# Patient Record
Sex: Male | Born: 1950
Health system: Southern US, Community
[De-identification: ages and names within clinical notes are randomized; demographics above are authoritative.]

## PROBLEM LIST (undated history)

## (undated) DIAGNOSIS — Z950 Presence of cardiac pacemaker: Secondary | ICD-10-CM

## (undated) DIAGNOSIS — I429 Cardiomyopathy, unspecified: Secondary | ICD-10-CM

## (undated) DIAGNOSIS — I442 Atrioventricular block, complete: Secondary | ICD-10-CM

## (undated) DIAGNOSIS — Z9889 Other specified postprocedural states: Secondary | ICD-10-CM

## (undated) DIAGNOSIS — R0600 Dyspnea, unspecified: Secondary | ICD-10-CM

## (undated) DIAGNOSIS — I509 Heart failure, unspecified: Secondary | ICD-10-CM

## (undated) DIAGNOSIS — I10 Essential (primary) hypertension: Secondary | ICD-10-CM

## (undated) DIAGNOSIS — I4510 Unspecified right bundle-branch block: Secondary | ICD-10-CM

## (undated) DIAGNOSIS — I340 Nonrheumatic mitral (valve) insufficiency: Secondary | ICD-10-CM

## (undated) DIAGNOSIS — E785 Hyperlipidemia, unspecified: Secondary | ICD-10-CM

## (undated) DIAGNOSIS — I447 Left bundle-branch block, unspecified: Secondary | ICD-10-CM

## (undated) HISTORY — DX: Unspecified right bundle-branch block: I45.10

## (undated) HISTORY — PX: PACEMAKER PLACEMENT: SHX43

## (undated) HISTORY — DX: Left bundle-branch block, unspecified: I44.7

## (undated) HISTORY — DX: Essential (primary) hypertension: I10

## (undated) HISTORY — DX: Hyperlipidemia, unspecified: E78.5

## (undated) HISTORY — PX: CARDIAC CATHETERIZATION: SHX172

## (undated) HISTORY — DX: Dyspnea, unspecified: R06.00

## (undated) HISTORY — DX: Cardiomyopathy, unspecified: I42.9

---

## 2011-04-20 ENCOUNTER — Ambulatory Visit (HOSPITAL_COMMUNITY): Payer: Federal, State, Local not specified - PPO

## 2011-04-20 ENCOUNTER — Ambulatory Visit (HOSPITAL_COMMUNITY)
Admission: RE | Admit: 2011-04-20 | Discharge: 2011-04-20 | Disposition: A | Discharge status: Home / Self Care | Payer: Federal, State, Local not specified - PPO | Source: Ambulatory Visit | Attending: Cardiovascular Disease | Admitting: Cardiovascular Disease

## 2011-04-20 DIAGNOSIS — Z45018 Encounter for adjustment and management of other part of cardiac pacemaker: Secondary | ICD-10-CM | POA: Insufficient documentation

## 2011-04-20 DIAGNOSIS — I442 Atrioventricular block, complete: Secondary | ICD-10-CM | POA: Insufficient documentation

## 2011-04-20 LAB — PROTIME-INR: INR: 1.02 (ref 0.00–1.49)

## 2011-04-20 LAB — APTT: aPTT: 32 seconds (ref 24–37)

## 2011-04-21 NOTE — Op Note (Signed)
NAME:  Russell Martin, Russell Martin NO.:  [id number]  MEDICAL RECORD NO.:  [id number]  LOCATION:  [facility]                         FACILITY:  [facility]  PHYSICIAN:  Thurmon Fair, MD     DATE OF BIRTH:  April 18, 1951  DATE OF PROCEDURE:  04/20/2011 DATE OF DISCHARGE:                              OPERATIVE REPORT   PROCEDURES PERFORMED: 1. Dual-chamber permanent pacemaker generator replacement. 2. Light sedation.  PREOPERATIVE DIAGNOSES: 1. Complete heart block. 2. Pacemaker generator at elective replacement interval.  POSTOPERATIVE DIAGNOSES: 1. Complete heart block. 2. Pacemaker generator at elective replacement interval. 3. Successful replacement of dual-chamber permanent pacemaker.  COMPLICATIONS:  None.  ESTIMATED BLOOD LOSS:  Less than 5 mL.  MEDICATIONS ADMINISTERED:  Ancef 1 gram intravenously, lidocaine 1% 35 mL locally, Versed 3 mg intravenously, fentanyl 75 mcg intravenously.  DEVICE DETAILS:  The explanted generator is a Manufacturing systems engineer, model number [id number], serial number [id number], implanted August 2001.  The chronic atrial lead is a St. Jude 1488 TC - 46 cm, serial number [id number], implanted April 16, 2000.  The chronic ventricular lead is a St. Jude Medical 1346 T/52 cm, serial number [id number], implanted April 16, 2000.  The new pacemaker generator is a Architectural technologist DR RF, model number [id number], serial number [id number].  Postoperative electronic parameters were essentially unchanged from preoperative findings and consisted of the following.  Atrial lead sensed P-wave 3.6 mV, threshold 0.9 volts at 0.4 milliseconds pulse width, impedance 466 ohms, current 1.9 mA.  Ventricular lead sensed R-waves 17.7 mV, threshold 1 volt at 0.4 milliseconds pulse width, impedance 375 ohms, current 2.5 mA.  After risks and benefits of the procedure were described, the patient provided informed consent and was brought to the Cardiac Cath Lab in a fasting state.  He  was prepped and draped in the usual sterile fashion. Local anesthesia with 1% lidocaine was administered to the area of the previous scar.  The scar was found to be extremely thick keloid and extended deep into the soft tissue.  Dissection down to the pocket was done slowly and carefully with blunt dissection and very limited thermal cautery.  The pocket was found to be extremely heavily calcified with calcium deposits thicker than a typical egg shell.  Slow careful dissection was performed with great care being taken to avoid injury to the chronic leads.  Also, attention was given to provide adequate hemostasis.  The pocket was revised to eliminate as much as possible of the chronic calcified tissue and also to enlarge it slightly to accommodate the larger radiofrequency device.  The chronic device was explanted and disconnected from the leads.  The leads were individually checked.  There was an underlying escape rhythm at about 29 beats per minute which allowed detail lead interrogation.  Subsequently, the chronic leads were attached to a new generator and appropriate ventricular pacing and subsequently atrial tracking and ventricular pacing were noted.  The pocket was reinspected for hemostasis after has been flushed with copious amounts of antibiotic solution.  The generator was then placed in the pocket with care being taken that the leads assume a comfortable position deep to the generator.  The pocket was closed with two layers of 2-0 Vicryl and cutaneous staples after which a sterile pressure dressing was applied.  No immediate complications occurred.     Thurmon Fair, MD     MC/MEDQ  D:  04/20/2011  T:  04/20/2011  Job:  [id number]  cc:   Gerlene Burdock A. Alanda Amass, M.D.  Electronically Signed by Thurmon Fair M.D. on 04/21/2011 11:27:54 AM

## 2012-05-07 ENCOUNTER — Ambulatory Visit
Admission: RE | Admit: 2012-05-07 | Discharge: 2012-05-07 | Disposition: A | Discharge status: Home / Self Care | Payer: Federal, State, Local not specified - PPO | Source: Ambulatory Visit | Attending: Family Medicine | Admitting: Family Medicine

## 2012-05-07 ENCOUNTER — Other Ambulatory Visit: Payer: Self-pay | Admitting: Family Medicine

## 2012-05-07 DIAGNOSIS — R059 Cough, unspecified: Secondary | ICD-10-CM

## 2012-05-07 DIAGNOSIS — R05 Cough: Secondary | ICD-10-CM

## 2012-05-07 DIAGNOSIS — R0602 Shortness of breath: Secondary | ICD-10-CM

## 2012-08-26 ENCOUNTER — Emergency Department (HOSPITAL_COMMUNITY): Payer: Federal, State, Local not specified - PPO

## 2012-08-26 ENCOUNTER — Encounter (HOSPITAL_COMMUNITY): Payer: Self-pay | Admitting: Emergency Medicine

## 2012-08-26 ENCOUNTER — Emergency Department (HOSPITAL_COMMUNITY)
Admission: EM | Admit: 2012-08-26 | Discharge: 2012-08-26 | Disposition: A | Discharge status: Home / Self Care | Payer: Federal, State, Local not specified - PPO | Attending: Emergency Medicine | Admitting: Emergency Medicine

## 2012-08-26 DIAGNOSIS — J40 Bronchitis, not specified as acute or chronic: Secondary | ICD-10-CM

## 2012-08-26 DIAGNOSIS — Z7982 Long term (current) use of aspirin: Secondary | ICD-10-CM | POA: Insufficient documentation

## 2012-08-26 DIAGNOSIS — Z950 Presence of cardiac pacemaker: Secondary | ICD-10-CM | POA: Insufficient documentation

## 2012-08-26 DIAGNOSIS — I509 Heart failure, unspecified: Secondary | ICD-10-CM | POA: Insufficient documentation

## 2012-08-26 DIAGNOSIS — R059 Cough, unspecified: Secondary | ICD-10-CM | POA: Insufficient documentation

## 2012-08-26 DIAGNOSIS — R05 Cough: Secondary | ICD-10-CM | POA: Insufficient documentation

## 2012-08-26 DIAGNOSIS — Z87891 Personal history of nicotine dependence: Secondary | ICD-10-CM | POA: Insufficient documentation

## 2012-08-26 DIAGNOSIS — Z79899 Other long term (current) drug therapy: Secondary | ICD-10-CM | POA: Insufficient documentation

## 2012-08-26 HISTORY — DX: Heart failure, unspecified: I50.9

## 2012-08-26 LAB — CBC WITH DIFFERENTIAL/PLATELET
Basophils Absolute: 0.1 10*3/uL (ref 0.0–0.1)
Eosinophils Absolute: 0.2 10*3/uL (ref 0.0–0.7)
Eosinophils Relative: 2 % (ref 0–5)
HCT: 45.3 % (ref 39.0–52.0)
Lymphocytes Relative: 31 % (ref 12–46)
Lymphs Abs: 3.3 10*3/uL (ref 0.7–4.0)
MCH: 27.9 pg (ref 26.0–34.0)
MCV: 82.5 fL (ref 78.0–100.0)
Monocytes Absolute: 0.6 10*3/uL (ref 0.1–1.0)
Platelets: 194 10*3/uL (ref 150–400)
RDW: 15.2 % (ref 11.5–15.5)
WBC: 10.5 10*3/uL (ref 4.0–10.5)

## 2012-08-26 LAB — COMPREHENSIVE METABOLIC PANEL
CO2: 23 mEq/L (ref 19–32)
Calcium: 9.7 mg/dL (ref 8.4–10.5)
Creatinine, Ser: 1.52 mg/dL — ABNORMAL HIGH (ref 0.50–1.35)
GFR calc Af Amer: 56 mL/min — ABNORMAL LOW (ref >90)
GFR calc non Af Amer: 48 mL/min — ABNORMAL LOW (ref >90)
Glucose, Bld: 138 mg/dL — ABNORMAL HIGH (ref 70–99)
Total Protein: 7.6 g/dL (ref 6.0–8.3)

## 2012-08-26 LAB — PRO B NATRIURETIC PEPTIDE: Pro B Natriuretic peptide (BNP): 896.6 pg/mL — ABNORMAL HIGH (ref 0–125)

## 2012-08-26 LAB — TROPONIN I: Troponin I: <0.3 ng/mL (ref <0.30)

## 2012-08-26 MED ORDER — AZITHROMYCIN 250 MG PO TABS: ORAL_TABLET | ORAL | Status: DC

## 2012-08-26 MED ORDER — ALBUTEROL SULFATE (5 MG/ML) 0.5% IN NEBU
5.0000 mg | INHALATION_SOLUTION | Freq: Once | RESPIRATORY_TRACT | Status: AC
  Administered 2012-08-26: 5 mg via RESPIRATORY_TRACT
  Filled 2012-08-26: qty 1

## 2012-08-26 MED ORDER — AZITHROMYCIN 250 MG PO TABS
500.0000 mg | ORAL_TABLET | Freq: Once | ORAL | Status: AC
  Administered 2012-08-26: 500 mg via ORAL
  Filled 2012-08-26: qty 2

## 2012-08-26 MED ORDER — FUROSEMIDE 10 MG/ML IJ SOLN
40.0000 mg | Freq: Once | INTRAMUSCULAR | Status: AC
  Administered 2012-08-26: 40 mg via INTRAVENOUS
  Filled 2012-08-26: qty 4

## 2012-08-26 MED ORDER — IPRATROPIUM BROMIDE 0.02 % IN SOLN
0.5000 mg | Freq: Once | RESPIRATORY_TRACT | Status: AC
  Administered 2012-08-26: 0.5 mg via RESPIRATORY_TRACT
  Filled 2012-08-26: qty 2.5

## 2012-08-26 MED ORDER — ALBUTEROL SULFATE HFA 108 (90 BASE) MCG/ACT IN AERS: 2.0000 | INHALATION_SPRAY | RESPIRATORY_TRACT | Status: DC | PRN

## 2012-08-26 MED ORDER — ALBUTEROL SULFATE HFA 108 (90 BASE) MCG/ACT IN AERS
2.0000 | INHALATION_SPRAY | RESPIRATORY_TRACT | Status: DC | PRN
  Administered 2012-08-26: 2 via RESPIRATORY_TRACT
  Filled 2012-08-26: qty 6.7

## 2012-08-26 NOTE — ED Notes (Signed)
Pt SpO2 94% on RA 

## 2012-08-26 NOTE — ED Notes (Signed)
PT. REPORTS PROGRESSING SOB WITH PRODUCTIVE COUGH/ WHEEZING ONSET THIS EVENING .

## 2012-08-26 NOTE — ED Provider Notes (Addendum)
History     CSN: [id number]  Arrival date & time 08/26/12  0209   First MD Initiated Contact with Patient 08/26/12 0229      Chief Complaint  Patient presents with  . Shortness of Breath  pt w hx pacemaker, chf, presents c/o generally non productive cough (occ productive of sputum) and sob for past day.  Symptoms worse w cough and lying flat.  +orthopnea, no pnd. No leg pain or swelling. No current or recent chest pain. States compliant w meds, no recent change in meds, or dietary/fluid indiscretion. Denies hx asthma or copd. Former smoker. Denies fever or chills. No known ill contacts.      (Consider location/radiation/quality/duration/timing/severity/associated sxs/prior treatment) Patient is a 61 y.o. male presenting with shortness of breath. The history is provided by the patient and a relative.  Shortness of Breath  Associated symptoms include cough and shortness of breath. Pertinent negatives include no chest pain and no fever.    Past Medical History  Diagnosis Date  . CHF (congestive heart failure)     Past Surgical History  Procedure Date  . Pacemaker placement     No family history on file.  History  Substance Use Topics  . Smoking status: Former Games developer  . Smokeless tobacco: Not on file  . Alcohol Use: No      Review of Systems  Constitutional: Negative for fever and chills.  HENT: Negative for neck pain.   Eyes: Negative for redness.  Respiratory: Positive for cough and shortness of breath.   Cardiovascular: Negative for chest pain, palpitations and leg swelling.  Gastrointestinal: Negative for abdominal pain.  Genitourinary: Negative for flank pain.  Musculoskeletal: Negative for back pain.  Skin: Negative for rash.  Neurological: Negative for headaches.  Hematological: Does not bruise/bleed easily.  Psychiatric/Behavioral: Negative for confusion.    Allergies  Review of patient's allergies indicates no known allergies.  Home Medications    Current Outpatient Rx  Name  Route  Sig  Dispense  Refill  . AMOXICILLIN 500 MG PO CAPS   Oral   Take 500 mg by mouth every 8 (eight) hours.         . ASPIRIN EC 81 MG PO TBEC   Oral   Take 81 mg by mouth daily.         [name] FELODIPINE ER 10 MG PO TB24   Oral   Take 10 mg by mouth daily.         . FUROSEMIDE 20 MG PO TABS   Oral   Take 20 mg by mouth 2 (two) times daily.         [name] NIACIN 500 MG PO TABS   Oral   Take 500 mg by mouth daily with breakfast.         . POTASSIUM CHLORIDE ER 10 MEQ PO TBCR   Oral   Take 10 mEq by mouth daily.         [name] SIMVASTATIN 80 MG PO TABS   Oral   Take 80 mg by mouth at bedtime.           BP 141/96  Temp 98.7 F (37.1 C) (Oral)  Resp 30  Ht 5' 10.5" (1.791 m)  Wt 197 lb (89.359 kg)  BMI 27.87 kg/m2  SpO2 95%  Physical Exam  Nursing note and vitals reviewed. Constitutional: He is oriented to person, place, and time. He appears well-developed and well-nourished. No distress.  HENT:  Nose: Nose normal.  Mouth/Throat: Oropharynx  is clear and moist.  Eyes: Conjunctivae normal are normal.  Neck: Neck supple. No JVD present. No tracheal deviation present.  Cardiovascular: Regular rhythm, normal heart sounds and intact distal pulses.   Pulmonary/Chest: Effort normal. No accessory muscle usage. No respiratory distress.       Sl wheeze bil. Bibasilar/Lower lob scat rales.   Abdominal: Soft. He exhibits no distension. There is no tenderness.  Musculoskeletal: Normal range of motion. He exhibits no edema and no tenderness.  Neurological: He is alert and oriented to person, place, and time.  Skin: Skin is warm and dry.  Psychiatric: He has a normal mood and affect.    ED Course  Procedures (including critical care time)  Results for orders placed during the hospital encounter of 08/26/12  CBC WITH DIFFERENTIAL      Component Value Range   WBC 10.5  4.0 - 10.5 K/uL   RBC 5.49  4.22 - 5.81 MIL/uL   Hemoglobin 15.3   13.0 - 17.0 g/dL   HCT 96.0  45.4 - 09.8 %   MCV 82.5  78.0 - 100.0 fL   MCH 27.9  26.0 - 34.0 pg   MCHC 33.8  30.0 - 36.0 g/dL   RDW 11.9  14.7 - 82.9 %   Platelets 194  150 - 400 K/uL   Neutrophils Relative 60  43 - 77 %   Neutro Abs 6.3  1.7 - 7.7 K/uL   Lymphocytes Relative 31  12 - 46 %   Lymphs Abs 3.3  0.7 - 4.0 K/uL   Monocytes Relative 6  3 - 12 %   Monocytes Absolute 0.6  0.1 - 1.0 K/uL   Eosinophils Relative 2  0 - 5 %   Eosinophils Absolute 0.2  0.0 - 0.7 K/uL   Basophils Relative 1  0 - 1 %   Basophils Absolute 0.1  0.0 - 0.1 K/uL  COMPREHENSIVE METABOLIC PANEL      Component Value Range   Sodium 137  135 - 145 mEq/L   Potassium 3.7  3.5 - 5.1 mEq/L   Chloride 100  96 - 112 mEq/L   CO2 23  19 - 32 mEq/L   Glucose, Bld 138 (*) 70 - 99 mg/dL   BUN 20  6 - 23 mg/dL   Creatinine, Ser 5.62 (*) 0.50 - 1.35 mg/dL   Calcium 9.7  8.4 - 13.0 mg/dL   Total Protein 7.6  6.0 - 8.3 g/dL   Albumin 3.8  3.5 - 5.2 g/dL   AST 32  0 - 37 U/L   ALT 21  0 - 53 U/L   Alkaline Phosphatase 73  39 - 117 U/L   Total Bilirubin 0.6  0.3 - 1.2 mg/dL   GFR calc non Af Amer 48 (*) >90 mL/min   GFR calc Af Amer 56 (*) >90 mL/min  PRO B NATRIURETIC PEPTIDE      Component Value Range   Pro B Natriuretic peptide (BNP) 896.6 (*) 0 - 125 pg/mL  TROPONIN I      Component Value Range   Troponin I <0.30  <0.30 ng/mL   Dg Chest Portable 1 View  08/26/2012  *RADIOLOGY REPORT*  Clinical Data: Shortness of breath  PORTABLE CHEST - 1 VIEW  Comparison: 05/07/2012  Findings: Cardiomegaly.  Central vascular congestion.  Left chest wall battery pack with lead tips projecting over the right atrium and right ventricle, unchanged.  Mild perihilar and interstitial prominence. Otherwise, no confluent airspace opacity, pleural effusion,  or pneumothorax. No acute osseous finding.  IMPRESSION: Cardiomegaly with central vascular congestion.  Mild perihilar / interstitial prominence may reflect a mild edema pattern.    Original Report Authenticated By: Jearld Lesch, M.D.          MDM  Labs. Cxr.  Lasix iv. Alb neb.   Reviewed nursing notes and prior charts for additional history.    Date: 08/26/2012  Rate: 112  Rhythm: ventricular paced rhythm  QRS Axis: left  Intervals: paced  ST/T Wave abnormalities: paced  Conduction Disutrbances:paced  Narrative Interpretation:   Old EKG Reviewed: none available  Wheezing sl improved w neb. Additional tx ordered.   Lasix iv.  Post second neb. Resolution of all wheezing. Good air exchange.   Pulse ox 94-95% on room air and pt states feels breathing at baseline, requests d/c.   w ?prod cough, ?bronchitis, will rx abx and inhaler. Pt states pcp had started on lasix a month ago, but then saw cardiology, weintraub, had echo and ?stress test, was told to hold on taking lasix.  Discussed w pt need close pcp and card f/u in next couple days, clarify plan for meds, recheck.         Suzi Roots, MD 08/26/12 1610  Suzi Roots, MD 08/26/12 9604  Suzi Roots, MD 08/26/12 [phone]

## 2012-09-16 ENCOUNTER — Encounter (HOSPITAL_COMMUNITY): Payer: Self-pay

## 2012-09-17 ENCOUNTER — Other Ambulatory Visit: Payer: Self-pay | Admitting: Cardiovascular Disease

## 2012-09-19 ENCOUNTER — Encounter (HOSPITAL_COMMUNITY)
Admission: RE | Disposition: A | Discharge status: Home / Self Care | Payer: Self-pay | Source: Ambulatory Visit | Attending: Cardiovascular Disease

## 2012-09-19 ENCOUNTER — Ambulatory Visit (HOSPITAL_COMMUNITY)
Admission: RE | Admit: 2012-09-19 | Discharge: 2012-09-19 | Disposition: A | Discharge status: Home / Self Care | Payer: Federal, State, Local not specified - PPO | Source: Ambulatory Visit | Attending: Cardiovascular Disease | Admitting: Cardiovascular Disease

## 2012-09-19 DIAGNOSIS — R0609 Other forms of dyspnea: Secondary | ICD-10-CM | POA: Insufficient documentation

## 2012-09-19 DIAGNOSIS — I059 Rheumatic mitral valve disease, unspecified: Secondary | ICD-10-CM | POA: Insufficient documentation

## 2012-09-19 DIAGNOSIS — Z950 Presence of cardiac pacemaker: Secondary | ICD-10-CM | POA: Insufficient documentation

## 2012-09-19 DIAGNOSIS — E785 Hyperlipidemia, unspecified: Secondary | ICD-10-CM | POA: Insufficient documentation

## 2012-09-19 DIAGNOSIS — I10 Essential (primary) hypertension: Secondary | ICD-10-CM | POA: Insufficient documentation

## 2012-09-19 DIAGNOSIS — I429 Cardiomyopathy, unspecified: Secondary | ICD-10-CM

## 2012-09-19 DIAGNOSIS — I428 Other cardiomyopathies: Secondary | ICD-10-CM | POA: Insufficient documentation

## 2012-09-19 DIAGNOSIS — R0989 Other specified symptoms and signs involving the circulatory and respiratory systems: Secondary | ICD-10-CM | POA: Insufficient documentation

## 2012-09-19 HISTORY — PX: LEFT AND RIGHT HEART CATHETERIZATION WITH CORONARY ANGIOGRAM: SHX5449

## 2012-09-19 HISTORY — DX: Cardiomyopathy, unspecified: I42.9

## 2012-09-19 LAB — POCT I-STAT 3, ART BLOOD GAS (G3+)
O2 Saturation: 90 %
pCO2 arterial: 35.3 mmHg (ref 35.0–45.0)
pO2, Arterial: 57 mmHg — ABNORMAL LOW (ref 80.0–100.0)

## 2012-09-19 LAB — POCT I-STAT 3, VENOUS BLOOD GAS (G3P V)
pH, Ven: 7.391 — ABNORMAL HIGH (ref 7.250–7.300)
pO2, Ven: 33 mmHg (ref 30.0–45.0)

## 2012-09-19 SURGERY — LEFT AND RIGHT HEART CATHETERIZATION WITH CORONARY ANGIOGRAM
Anesthesia: LOCAL

## 2012-09-19 MED ORDER — MIDAZOLAM HCL 2 MG/2ML IJ SOLN
INTRAMUSCULAR | Status: AC
  Filled 2012-09-19: qty 2

## 2012-09-19 MED ORDER — SODIUM CHLORIDE 0.9 % IJ SOLN: 3.0000 mL | INTRAMUSCULAR | Status: DC | PRN

## 2012-09-19 MED ORDER — FENTANYL CITRATE 0.05 MG/ML IJ SOLN
INTRAMUSCULAR | Status: AC
  Filled 2012-09-19: qty 2

## 2012-09-19 MED ORDER — ASPIRIN 81 MG PO CHEW
CHEWABLE_TABLET | ORAL | Status: AC
  Filled 2012-09-19: qty 4

## 2012-09-19 MED ORDER — LIDOCAINE HCL (PF) 1 % IJ SOLN
INTRAMUSCULAR | Status: AC
  Filled 2012-09-19: qty 30

## 2012-09-19 MED ORDER — HEPARIN (PORCINE) IN NACL 2-0.9 UNIT/ML-% IJ SOLN
INTRAMUSCULAR | Status: AC
  Filled 2012-09-19: qty 2000

## 2012-09-19 MED ORDER — NITROGLYCERIN 0.2 MG/ML ON CALL CATH LAB
INTRAVENOUS | Status: AC
  Filled 2012-09-19: qty 1

## 2012-09-19 MED ORDER — SODIUM CHLORIDE 0.9 % IV SOLN: 1.0000 mL/kg/h | INTRAVENOUS | Status: DC

## 2012-09-19 MED ORDER — ACETAMINOPHEN 325 MG PO TABS: 650.0000 mg | ORAL_TABLET | ORAL | Status: DC | PRN

## 2012-09-19 MED ORDER — DIAZEPAM 5 MG PO TABS
ORAL_TABLET | ORAL | Status: AC
  Filled 2012-09-19: qty 1

## 2012-09-19 MED ORDER — ASPIRIN 81 MG PO CHEW
324.0000 mg | CHEWABLE_TABLET | ORAL | Status: AC
  Administered 2012-09-19: 324 mg via ORAL

## 2012-09-19 MED ORDER — ONDANSETRON HCL 4 MG/2ML IJ SOLN: 4.0000 mg | Freq: Four times a day (QID) | INTRAMUSCULAR | Status: DC | PRN

## 2012-09-19 MED ORDER — SODIUM CHLORIDE 0.9 % IV SOLN
INTRAVENOUS | Status: DC
  Administered 2012-09-19: 09:00:00 via INTRAVENOUS

## 2012-09-19 MED ORDER — DIAZEPAM 5 MG PO TABS
5.0000 mg | ORAL_TABLET | ORAL | Status: AC
  Administered 2012-09-19: 5 mg via ORAL

## 2012-09-19 NOTE — CV Procedure (Signed)
Russell Martin is a 62 y.o. male    [id number]  [id number] LOCATION:  FACILITY: [facility]  PHYSICIAN: [name], MD, [facility] 01/05/1951   DATE OF PROCEDURE:  09/19/2012  DATE OF DISCHARGE:  [facility]  CARDIAC CATHETERIZATION     History obtained from chart review and the patient.   PROCEDURE DESCRIPTION:   The patient was brought to the second floor  [facility] lab in the postabsorptive state. He was premedicated with  . His right groin was prepped and shaved in usual sterile fashion. Xylocaine 1% was used  for local anesthesia. A 5 French sheath was inserted into the  RFA and 7 [place] into the RFV.  CO was determined by the Fick and Thermodilution methods. 5 [place] FL4, FR4, and pigtail catheters were utilized. Hemostasis was obtained by manual compression.   HEMODYNAMICS: RA: 11 RV: 58-60/10 PA: 60/28; mean 44 PC: 17; v wave 25   AO: 116/68 LV: 116/12  CO: 5.4 (Thermo); 5.3 l/m CI:    2.6                  2.5 l/m/m2    ANGIOGRAPHIC RESULTS:   1. Left main:  Nl  2. LAD:  Nl 3. Left circumflex: Nl.  4. Right coronary artery: Nl  LV: globally reduced EF 30 -35%             IMPRESSION:  Nonischemic Cardiomyopathy  Per Dr. Kandis Cocking recommendation, I have discussed with Dr.Taylor concerning evaluation for Bi-Ventricular upgrade to see if LV function improves, and may ultimately require ICD.  [name], MD, [facility] 09/19/2012 11:20 AM

## 2012-09-19 NOTE — H&P (Signed)
  Updated H&P:  See complete dictated note by Dr. Alanda Amass from 09/16/12. Pt still appears dyspneic.  No change in PEx with at least 2/6 MR murmur. Labs reviewed. Cr 1.6. BNP 747. For R&L heart cath today as scheduled by Dr. Alanda Amass.  Discussed procedure in detail with pt and wife. KELLY,THOMAS A 09/19/2012 9:50 AM

## 2012-09-25 ENCOUNTER — Ambulatory Visit: Payer: Federal, State, Local not specified - PPO | Admitting: Cardiology

## 2012-10-21 ENCOUNTER — Ambulatory Visit (INDEPENDENT_AMBULATORY_CARE_PROVIDER_SITE_OTHER): Payer: Federal, State, Local not specified - PPO | Admitting: Internal Medicine

## 2012-10-21 ENCOUNTER — Encounter: Payer: Self-pay | Admitting: Internal Medicine

## 2012-10-21 ENCOUNTER — Other Ambulatory Visit: Payer: Self-pay | Admitting: Family Medicine

## 2012-10-21 VITALS — BP 123/85 | HR 78 | Resp 20 | Ht 70.0 in | Wt 198.0 lb

## 2012-10-21 DIAGNOSIS — I10 Essential (primary) hypertension: Secondary | ICD-10-CM

## 2012-10-21 DIAGNOSIS — I5022 Chronic systolic (congestive) heart failure: Secondary | ICD-10-CM | POA: Insufficient documentation

## 2012-10-21 DIAGNOSIS — R109 Unspecified abdominal pain: Secondary | ICD-10-CM

## 2012-10-21 DIAGNOSIS — I442 Atrioventricular block, complete: Secondary | ICD-10-CM

## 2012-10-21 DIAGNOSIS — Z950 Presence of cardiac pacemaker: Secondary | ICD-10-CM

## 2012-10-21 NOTE — Progress Notes (Signed)
HPI Russell Martin is referred today by Dr. Kelly for consideration for biventricular pacemaker upgrade. He is a very pleasant 61-year-old man with long-standing complete heart block, status post permanent pacemaker insertion over 12 years ago. He underwent pacemaker generator change out a year ago. Approximately 5 months ago, the patient began to experience increasing shortness of breath, cough, and peripheral edema. Subsequent evaluation demonstrated an ejection fraction of 30-35% by echo. His QRS duration is 200 ms, paced. The patient underwent heart catheterization several weeks ago, which demonstrated no significant coronary disease, and an ejection fraction of 35%. He is on maximal medical therapy with an ACE inhibitor,a beta blocker, and diuretics. Despite this, his heart failure symptoms are class III. No Known Allergies   Current Outpatient Prescriptions  Medication Sig Dispense Refill  . albuterol (PROVENTIL HFA;VENTOLIN HFA) 108 (90 BASE) MCG/ACT inhaler Inhale 2 puffs into the lungs every 4 (four) hours as needed for wheezing.  1 Inhaler  1  . allopurinol (ZYLOPRIM) 100 MG tablet Take 100 mg by mouth daily.      . aspirin EC 81 MG tablet Take 81 mg by mouth daily.      . benazepril (LOTENSIN) 10 MG tablet Take 10 mg by mouth daily.      . carvedilol (COREG) 6.25 MG tablet Take 12.5 mg by mouth daily.      . furosemide (LASIX) 20 MG tablet Take 20 mg by mouth 2 (two) times daily.      . niacin 500 MG tablet Take 500 mg by mouth daily with breakfast.      . potassium chloride (K-DUR) 10 MEQ tablet Take 10 mEq by mouth daily.      . simvastatin (ZOCOR) 80 MG tablet Take 80 mg by mouth at bedtime.       No current facility-administered medications for this visit.     Past Medical History  Diagnosis Date  . CHF (congestive heart failure)   . Hypertension   . Hyperlipidemia     ROS:   All systems reviewed and negative except as noted in the HPI.   Past Surgical History  Procedure  Laterality Date  . Pacemaker placement       No family history on file.   History   Social History  . Marital Status: Married    Spouse Name: N/A    Number of Children: N/A  . Years of Education: N/A   Occupational History  . Not on file.   Social History Main Topics  . Smoking status: Former Smoker  . Smokeless tobacco: Not on file  . Alcohol Use: No  . Drug Use: No  . Sexually Active:    Other Topics Concern  . Not on file   Social History Narrative  . No narrative on file     BP 123/85  Pulse 78  Resp 20  Ht 5' 10" (1.778 m)  Wt 198 lb (89.812 kg)  BMI 28.41 kg/m2  Physical Exam:  Well appearing 61-year-old man,NAD HEENT: Unremarkable Neck:  7 cm JVD, no thyromegally Lungs:  Clearexcept for basilar rales. No wheezes or rhonchi. HEART:  Regular rate rhythm, no murmurs, no rubs, no clicks, S2 is widely split. Abd:  soft, positive bowel sounds, no organomegally, no rebound, no guarding Ext:  2 plus pulses, no edema, no cyanosis, no clubbing Skin:  No rashes no nodules Neuro:  CN II through XII intact, motor grossly intact  EKGnormal sinus rhythm with ventricular pacing, QRS duration 200 ms.  DEVICE    Normal device function.  See PaceArt for details.   Assess/Plan: 

## 2012-10-21 NOTE — Assessment & Plan Note (Addendum)
I've discussed the treatment options with the patient. He has underlying complete heart block and pacing induced bundle branch block, with a QRS duration of 200 ms. With his ejection fraction of 35%, I would elect to place a biventricular pacemaker rather than a biventricular ICD, as the patient has had no syncope, and I expect his ejection fraction will improve with biventricular pacing. The risk, goals, benefits, and expectations of upgrade from his dual-chamber pacemaker to a biventricular pacemaker have been discussed, and he wishes to proceed.

## 2012-10-21 NOTE — Assessment & Plan Note (Signed)
His St. Jude dual-chamber device is working normally. As discussed, we'll plan a biventricular pacemaker upgrade.

## 2012-10-27 ENCOUNTER — Encounter: Payer: Self-pay | Admitting: Internal Medicine

## 2012-10-27 ENCOUNTER — Other Ambulatory Visit: Payer: Federal, State, Local not specified - PPO

## 2012-10-28 ENCOUNTER — Encounter: Payer: Self-pay | Admitting: *Deleted

## 2012-10-28 ENCOUNTER — Ambulatory Visit
Admission: RE | Admit: 2012-10-28 | Discharge: 2012-10-28 | Disposition: A | Discharge status: Home / Self Care | Payer: Federal, State, Local not specified - PPO | Source: Ambulatory Visit | Attending: Family Medicine | Admitting: Family Medicine

## 2012-10-28 ENCOUNTER — Other Ambulatory Visit: Payer: Self-pay | Admitting: *Deleted

## 2012-10-28 DIAGNOSIS — I519 Heart disease, unspecified: Secondary | ICD-10-CM

## 2012-10-28 DIAGNOSIS — R109 Unspecified abdominal pain: Secondary | ICD-10-CM

## 2012-10-28 MED ORDER — IOHEXOL 300 MG/ML  SOLN
100.0000 mL | Freq: Once | INTRAMUSCULAR | Status: AC | PRN
  Administered 2012-10-28: 100 mL via INTRAVENOUS

## 2012-10-31 ENCOUNTER — Other Ambulatory Visit: Payer: Self-pay | Admitting: Family Medicine

## 2012-10-31 DIAGNOSIS — R1011 Right upper quadrant pain: Secondary | ICD-10-CM

## 2012-10-31 NOTE — Addendum Note (Signed)
Addended by: Burnett Kanaris A on: 10/31/2012 10:13 AM   Modules accepted: Orders

## 2012-11-04 ENCOUNTER — Encounter (HOSPITAL_COMMUNITY): Payer: Self-pay | Admitting: Pharmacy Technician

## 2012-11-05 ENCOUNTER — Other Ambulatory Visit (INDEPENDENT_AMBULATORY_CARE_PROVIDER_SITE_OTHER): Payer: Federal, State, Local not specified - PPO

## 2012-11-05 ENCOUNTER — Ambulatory Visit
Admission: RE | Admit: 2012-11-05 | Discharge: 2012-11-05 | Disposition: A | Discharge status: Home / Self Care | Payer: Federal, State, Local not specified - PPO | Source: Ambulatory Visit | Attending: Family Medicine | Admitting: Family Medicine

## 2012-11-05 DIAGNOSIS — R1011 Right upper quadrant pain: Secondary | ICD-10-CM

## 2012-11-05 DIAGNOSIS — I519 Heart disease, unspecified: Secondary | ICD-10-CM

## 2012-11-05 LAB — BASIC METABOLIC PANEL
Chloride: 107 mEq/L (ref 96–112)
Potassium: 4.4 mEq/L (ref 3.5–5.1)

## 2012-11-05 LAB — CBC WITH DIFFERENTIAL/PLATELET
Basophils Relative: 0.7 % (ref 0.0–3.0)
Eosinophils Relative: 0.7 % (ref 0.0–5.0)
HCT: 42.6 % (ref 39.0–52.0)
MCV: 81.7 fl (ref 78.0–100.0)
Monocytes Absolute: 1.1 10*3/uL — ABNORMAL HIGH (ref 0.1–1.0)
Monocytes Relative: 18.5 % — ABNORMAL HIGH (ref 3.0–12.0)
Neutrophils Relative %: 47.7 % (ref 43.0–77.0)
RBC: 5.22 Mil/uL (ref 4.22–5.81)
WBC: 6.2 10*3/uL (ref 4.5–10.5)

## 2012-11-11 ENCOUNTER — Other Ambulatory Visit (HOSPITAL_COMMUNITY): Payer: Self-pay | Admitting: Family Medicine

## 2012-11-11 DIAGNOSIS — R634 Abnormal weight loss: Secondary | ICD-10-CM

## 2012-11-11 DIAGNOSIS — R109 Unspecified abdominal pain: Secondary | ICD-10-CM

## 2012-11-13 MED ORDER — SODIUM CHLORIDE 0.9 % IR SOLN
80.0000 mg | Status: DC
  Filled 2012-11-13: qty 2

## 2012-11-13 MED ORDER — CEFAZOLIN SODIUM-DEXTROSE 2-3 GM-% IV SOLR
2.0000 g | INTRAVENOUS | Status: DC
  Filled 2012-11-13 (×2): qty 50

## 2012-11-14 ENCOUNTER — Ambulatory Visit (HOSPITAL_COMMUNITY)
Admission: RE | Admit: 2012-11-14 | Discharge: 2012-11-15 | Disposition: A | Discharge status: Home / Self Care | Payer: Federal, State, Local not specified - PPO | Source: Ambulatory Visit | Attending: Internal Medicine | Admitting: Internal Medicine

## 2012-11-14 ENCOUNTER — Encounter (HOSPITAL_COMMUNITY)
Admission: RE | Disposition: A | Discharge status: Home / Self Care | Payer: Self-pay | Source: Ambulatory Visit | Attending: Internal Medicine

## 2012-11-14 ENCOUNTER — Encounter (HOSPITAL_COMMUNITY): Payer: Self-pay | Admitting: *Deleted

## 2012-11-14 DIAGNOSIS — I428 Other cardiomyopathies: Secondary | ICD-10-CM | POA: Insufficient documentation

## 2012-11-14 DIAGNOSIS — E785 Hyperlipidemia, unspecified: Secondary | ICD-10-CM | POA: Insufficient documentation

## 2012-11-14 DIAGNOSIS — N289 Disorder of kidney and ureter, unspecified: Secondary | ICD-10-CM | POA: Insufficient documentation

## 2012-11-14 DIAGNOSIS — I442 Atrioventricular block, complete: Secondary | ICD-10-CM | POA: Insufficient documentation

## 2012-11-14 DIAGNOSIS — I519 Heart disease, unspecified: Secondary | ICD-10-CM

## 2012-11-14 DIAGNOSIS — I509 Heart failure, unspecified: Secondary | ICD-10-CM | POA: Insufficient documentation

## 2012-11-14 DIAGNOSIS — I10 Essential (primary) hypertension: Secondary | ICD-10-CM | POA: Insufficient documentation

## 2012-11-14 DIAGNOSIS — I5022 Chronic systolic (congestive) heart failure: Secondary | ICD-10-CM | POA: Insufficient documentation

## 2012-11-14 DIAGNOSIS — I447 Left bundle-branch block, unspecified: Secondary | ICD-10-CM

## 2012-11-14 HISTORY — DX: Atrioventricular block, complete: I44.2

## 2012-11-14 HISTORY — PX: BI-VENTRICULAR PACEMAKER UPGRADE: SHX5464

## 2012-11-14 SURGERY — BI-VENTRICULAR PACEMAKER UPGRADE
Anesthesia: LOCAL

## 2012-11-14 MED ORDER — MUPIROCIN 2 % EX OINT
TOPICAL_OINTMENT | CUTANEOUS | Status: AC
  Administered 2012-11-14: 06:00:00
  Filled 2012-11-14: qty 22

## 2012-11-14 MED ORDER — MUPIROCIN 2 % EX OINT
TOPICAL_OINTMENT | Freq: Two times a day (BID) | CUTANEOUS | Status: DC
  Filled 2012-11-14: qty 22

## 2012-11-14 MED ORDER — TRAMADOL HCL 50 MG PO TABS
50.0000 mg | ORAL_TABLET | Freq: Four times a day (QID) | ORAL | Status: DC | PRN
  Administered 2012-11-14: 50 mg via ORAL
  Filled 2012-11-14: qty 1

## 2012-11-14 MED ORDER — ASPIRIN EC 81 MG PO TBEC
81.0000 mg | DELAYED_RELEASE_TABLET | Freq: Every day | ORAL | Status: DC
  Administered 2012-11-14 – 2012-11-15 (×2): 81 mg via ORAL
  Filled 2012-11-14 (×2): qty 1

## 2012-11-14 MED ORDER — ALLOPURINOL 100 MG PO TABS
100.0000 mg | ORAL_TABLET | Freq: Every day | ORAL | Status: DC
  Administered 2012-11-14 – 2012-11-15 (×2): 100 mg via ORAL
  Filled 2012-11-14 (×2): qty 1

## 2012-11-14 MED ORDER — FUROSEMIDE 20 MG PO TABS
20.0000 mg | ORAL_TABLET | Freq: Two times a day (BID) | ORAL | Status: DC
  Administered 2012-11-14 – 2012-11-15 (×3): 20 mg via ORAL
  Filled 2012-11-14 (×5): qty 1

## 2012-11-14 MED ORDER — SODIUM CHLORIDE 0.9 % IV SOLN: 250.0000 mL | INTRAVENOUS | Status: DC

## 2012-11-14 MED ORDER — SODIUM CHLORIDE 0.9 % IJ SOLN: 3.0000 mL | Freq: Two times a day (BID) | INTRAMUSCULAR | Status: DC

## 2012-11-14 MED ORDER — PANTOPRAZOLE SODIUM 40 MG PO TBEC
40.0000 mg | DELAYED_RELEASE_TABLET | Freq: Every day | ORAL | Status: DC
  Administered 2012-11-15: 40 mg via ORAL
  Filled 2012-11-14: qty 1

## 2012-11-14 MED ORDER — SODIUM CHLORIDE 0.45 % IV SOLN
INTRAVENOUS | Status: DC
  Administered 2012-11-14: 06:00:00 via INTRAVENOUS

## 2012-11-14 MED ORDER — ATORVASTATIN CALCIUM 40 MG PO TABS
40.0000 mg | ORAL_TABLET | Freq: Every day | ORAL | Status: DC
  Administered 2012-11-14: 40 mg via ORAL
  Filled 2012-11-14 (×2): qty 1

## 2012-11-14 MED ORDER — MIDAZOLAM HCL 5 MG/5ML IJ SOLN
INTRAMUSCULAR | Status: AC
  Filled 2012-11-14: qty 5

## 2012-11-14 MED ORDER — HEPARIN (PORCINE) IN NACL 2-0.9 UNIT/ML-% IJ SOLN
INTRAMUSCULAR | Status: AC
  Filled 2012-11-14: qty 500

## 2012-11-14 MED ORDER — CARVEDILOL 12.5 MG PO TABS
12.5000 mg | ORAL_TABLET | Freq: Every day | ORAL | Status: DC
  Administered 2012-11-14 – 2012-11-15 (×2): 12.5 mg via ORAL
  Filled 2012-11-14 (×2): qty 1

## 2012-11-14 MED ORDER — SODIUM CHLORIDE 0.9 % IJ SOLN: 3.0000 mL | INTRAMUSCULAR | Status: DC | PRN

## 2012-11-14 MED ORDER — ACETAMINOPHEN 325 MG PO TABS
325.0000 mg | ORAL_TABLET | ORAL | Status: DC | PRN
  Administered 2012-11-14: 650 mg via ORAL
  Filled 2012-11-14: qty 2

## 2012-11-14 MED ORDER — ONDANSETRON HCL 4 MG/2ML IJ SOLN: 4.0000 mg | Freq: Four times a day (QID) | INTRAMUSCULAR | Status: DC | PRN

## 2012-11-14 MED ORDER — CHLORHEXIDINE GLUCONATE 4 % EX LIQD: 60.0000 mL | Freq: Once | CUTANEOUS | Status: DC

## 2012-11-14 MED ORDER — FENTANYL CITRATE 0.05 MG/ML IJ SOLN
INTRAMUSCULAR | Status: AC
  Filled 2012-11-14: qty 2

## 2012-11-14 MED ORDER — CEFAZOLIN SODIUM-DEXTROSE 2-3 GM-% IV SOLR
2.0000 g | Freq: Four times a day (QID) | INTRAVENOUS | Status: AC
  Administered 2012-11-14 – 2012-11-15 (×3): 2 g via INTRAVENOUS
  Filled 2012-11-14 (×4): qty 50

## 2012-11-14 MED ORDER — LIDOCAINE HCL (PF) 1 % IJ SOLN
INTRAMUSCULAR | Status: AC
  Filled 2012-11-14: qty 60

## 2012-11-14 NOTE — Progress Notes (Signed)
Orthopedic Tech Progress Note Patient Details:  Russell Martin 08-Sep-1951 [id number] Patient has sling Patient ID: [name], male   DOB: 1950/12/18, 62 y.o.   MRN: [id number]   Orie Rout 11/14/2012, 12:53 PM

## 2012-11-14 NOTE — H&P (View-Only) (Signed)
HPI [name] is referred today by Dr. Tresa Endo for consideration for biventricular pacemaker upgrade. He is a very pleasant 62 year old man with long-standing complete heart block, status post permanent pacemaker insertion over 12 years ago. He underwent pacemaker generator change out a year ago. Approximately 5 months ago, the patient began to experience increasing shortness of breath, cough, and peripheral edema. Subsequent evaluation demonstrated an ejection fraction of 30-35% by echo. His QRS duration is 200 ms, paced. The patient underwent heart catheterization several weeks ago, which demonstrated no significant coronary disease, and an ejection fraction of 35%. He is on maximal medical therapy with an ACE inhibitor,a beta blocker, and diuretics. Despite this, his heart failure symptoms are class III. No Known Allergies   Current Outpatient Prescriptions  Medication Sig Dispense Refill  . albuterol (PROVENTIL HFA;VENTOLIN HFA) 108 (90 BASE) MCG/ACT inhaler Inhale 2 puffs into the lungs every 4 (four) hours as needed for wheezing.  1 Inhaler  1  . allopurinol (ZYLOPRIM) 100 MG tablet Take 100 mg by mouth daily.      [name] aspirin EC 81 MG tablet Take 81 mg by mouth daily.      . benazepril (LOTENSIN) 10 MG tablet Take 10 mg by mouth daily.      . carvedilol (COREG) 6.25 MG tablet Take 12.5 mg by mouth daily.      . furosemide (LASIX) 20 MG tablet Take 20 mg by mouth 2 (two) times daily.      . niacin 500 MG tablet Take 500 mg by mouth daily with breakfast.      . potassium chloride (K-DUR) 10 MEQ tablet Take 10 mEq by mouth daily.      . simvastatin (ZOCOR) 80 MG tablet Take 80 mg by mouth at bedtime.       No current facility-administered medications for this visit.     Past Medical History  Diagnosis Date  . CHF (congestive heart failure)   . Hypertension   . Hyperlipidemia     ROS:   All systems reviewed and negative except as noted in the HPI.   Past Surgical History  Procedure  Laterality Date  . Pacemaker placement       No family history on file.   History   Social History  . Marital Status: Married    Spouse Name: N/A    Number of Children: N/A  . Years of Education: N/A   Occupational History  . Not on file.   Social History Main Topics  . Smoking status: Former Games developer  . Smokeless tobacco: Not on file  . Alcohol Use: No  . Drug Use: No  . Sexually Active:    Other Topics Concern  . Not on file   Social History Narrative  . No narrative on file     BP 123/85  Pulse 78  Resp 20  Ht 5\' 10"  (1.778 m)  Wt 198 lb (89.812 kg)  BMI 28.41 kg/m2  Physical Exam:  Well appearing 62 year old man,NAD HEENT: Unremarkable Neck:  7 cm JVD, no thyromegally Lungs:  Clearexcept for basilar rales. No wheezes or rhonchi. HEART:  Regular rate rhythm, no murmurs, no rubs, no clicks, S2 is widely split. Abd:  soft, positive bowel sounds, no organomegally, no rebound, no guarding Ext:  2 plus pulses, no edema, no cyanosis, no clubbing Skin:  No rashes no nodules Neuro:  CN II through XII intact, motor grossly intact  EKGnormal sinus rhythm with ventricular pacing, QRS duration 200 ms.  DEVICE  Normal device function.  See PaceArt for details.   Assess/Plan:

## 2012-11-14 NOTE — Interval H&P Note (Signed)
History and Physical Interval Note:  11/14/2012 7:33 AM  Russell Martin  has presented today for surgery, with the diagnosis of heart failure  The various methods of treatment have been discussed with the patient and family. After consideration of risks, benefits and other options for treatment, the patient has consented to  Procedure(s): BI-VENTRICULAR PACEMAKER UPGRADE (N/A) as a surgical intervention .  The patient's history has been reviewed, patient examined, no change in status, stable for surgery.  I have reviewed the patient's chart and labs.  Questions were answered to the patient's satisfaction.     Leonia Reeves.D.

## 2012-11-14 NOTE — Op Note (Signed)
BiV PPM upgrade via the left subclavian vein without immediate complication. [id number].

## 2012-11-15 ENCOUNTER — Encounter (HOSPITAL_COMMUNITY): Payer: Self-pay | Admitting: Physician Assistant

## 2012-11-15 ENCOUNTER — Ambulatory Visit (HOSPITAL_COMMUNITY): Payer: Federal, State, Local not specified - PPO

## 2012-11-15 DIAGNOSIS — I442 Atrioventricular block, complete: Secondary | ICD-10-CM

## 2012-11-15 NOTE — Op Note (Signed)
NAME:  Russell Martin, Russell Martin NO.:  [id number]  MEDICAL RECORD NO.:  [id number]  LOCATION:  3W11C                        FACILITY:  [facility]  PHYSICIAN:  Doylene Canning. Ladona Ridgel, MD    DATE OF BIRTH:  [birth year]  DATE OF PROCEDURE:  11/14/2012 DATE OF DISCHARGE:                              OPERATIVE REPORT   PROCEDURE PERFORMED:  Upgrade of a dual-chamber pacemaker to a biventricular pacemaker.  INDICATION:  Nonischemic cardiomyopathy, chronic systolic heart failure, pacing-induced left bundle-branch block, QRS duration 200 milliseconds.  INTRODUCTION:  The patient is a very pleasant 62 year old male with longstanding complete heart block, status post initial pacemaker insertion in 2001.  He underwent upgrade several months ago.  He has developed worsening congestive heart failure symptoms with an ejection fraction of 30%.  He has pacing-induced left bundle-branch block with a QRS duration of 200 milliseconds, and is now referred for upgrade from a dual-chamber pacemaker to a biventricular pacemaker.  PROCEDURE:  After informed consent was obtained, the patient was taken to the Diagnostic EP Lab in the fasting state.  After usual preparation and draping, intravenous fentanyl and midazolam was given for sedation. A 30 mL of lidocaine was infiltrated into the left infraclavicular region.  A 6-cm incision was carried out over this region, and electrocautery was utilized to dissect down to the fascial plane.  Prior to the procedure to confirm that the left subclavian vein was patent and to outline its course, 10 mL of contrast was injected into the left upper extremity venous system, which demonstrated that the vein was in fact patent.  The subclavian vein was then punctured and the guiding catheter was advanced into the right atrium.  It was exchanged for a large hook guiding catheter as the right atrial dimension was quite large.  The coronary sinus was then cannulated with a  6-French hexapolar EP catheter and the guiding catheter was advanced into the coronary sinus.  Venography of the coronary sinus was then carried out demonstrating a large coronary sinus with a posterolateral vein, which was acceptable for LV lead placement.  The St. Jude QuickFlex, model 1258T 86-cm bipolar pacing lead was advanced over an 0.014 Mailman angioplasty guidewire into the posterolateral vein and deployed.  The guiding catheter was removed in the usual manner and the lead was secured to the subpectoral fascia with figure-of-eight silk suture, and the sewing sleeve was secured with silk suture.  At this point, electrocautery and blunt dissection was utilized to dissect down through the pectoralis major and created a subpectoral pocket for this patient. This was carried out without complication.  The St. Jude Anthem RF biventricular pacemaker was then connected to the new LV lead and the old atrial and old RV leads, which were working satisfactorily and placed back in the subpectoral pocket.  The pocket was then irrigated and the muscle was reapproximated with 2-0 Vicryl suture, and the 2-0 and 3-0 Vicryl sutures were utilized to seal up the skin.  A pressure dressing was placed.  Benzoin and Steri-Strips were painted on the skin and the patient was returned to his room in satisfactory condition.  COMPLICATIONS:  There were no immediate procedure complications.  RESULTS:  This demonstrates successful biventricular pacemaker upgrade in a patient with prior to complete heart block, nonischemic cardiomyopathy, left bundle-branch block, QRS duration 200 milliseconds.     Doylene Canning. Ladona Ridgel, MD     GWT/MEDQ  D:  11/14/2012  T:  11/15/2012  Job:  [id number]  cc:   Thurmon Fair, MD

## 2012-11-15 NOTE — Discharge Summary (Signed)
Discharge Summary   Patient ID: Russell Martin MRN: [id number], DOB/AGE: 60-09-1950 60 y.o. Admit date: 11/14/2012 D/C date:     11/15/2012  Primary Cardiologist: EP - Ladona Ridgel (cardiologist = Alanda Amass)  Primary Discharge Diagnoses:  1. Chronic systolic CHF/NICM EF 35% - s/p upgrade to biventricular pacemaker 11/14/12 - normal cors by cath 09/2012 2. Complete heart block s/p initial pacemaker 12 years ago  Secondary Discharge Diagnoses:  1. HTN 2. Hyperlipidemia 3. Renal insufficiency - Cr 1.5-1.7 by most recent labs  Hospital Course: Russell Martin is a 62 y/o M with history of CHB with pacemaker 12 years ago and gen change a year ago, HTN, HL who was found to have EF of 30-35% by echocardiogram this year who was referred to Dr. Ladona Ridgel for consideration of Bi-V upgrade. QRS duration is , paced. He underwent heart catheterization several weeks ago, which demonstrated no significant coronary disease, and an ejection fraction of 35%. Despite max medical therapy with an ACE inhibitor,a beta blocker, and diuretics, his CHF symptoms were class III. With his ejection fraction of 35%, Dr. Ladona Ridgel elected to place a biventricular pacemaker rather than a biventricular ICD, as the patient has had no syncope, and Dr. Ladona Ridgel expects his ejection fraction will improve with biventricular pacing. He was brought in for this procedure 3/7 and underwent successful BiV PPM upgrade via the left subclavian vein without immediate complication. He was observed overnight. CXR this AM is nonacute. The patient was previously on Lotensin but thinks he was taken off of this in the meantime as it was not on his admission med list. He is instructed to discuss with Dr. Alanda Amass. Dr. Patty Sermons has seen and examined him today and feels he is stable for discharge.  Discharge Vitals: Blood pressure 137/91, pulse 74, temperature 97.9 F (36.6 C), temperature source Oral, resp. rate 16, height 5' 10.5" (1.791 m), weight 200 lb (90.719  kg), SpO2 95.00%.  Labs: none this admission Lab Results  Component Value Date   WBC 6.2 11/05/2012   HGB 13.5 11/05/2012   HCT 42.6 11/05/2012   MCV 81.7 11/05/2012   PLT 168.0 11/05/2012    Diagnostic Studies/Procedures   Dg Chest 2 View 11/15/2012  *RADIOLOGY REPORT*  Clinical Data: Pacemaker implant.  CHEST - 2 VIEW  Comparison: Chest x-ray 08/26/2012.  Findings: Left-sided biventricular pacemaker are in place with lead tips projecting over the expected location of the right atrium, right ventricular apex and overlying the lateral wall left ventricle via the coronary sinus and coronary veins.  Moderate cardiomegaly is again noted.  No pneumothorax.  Lungs are clear. Pulmonary vasculature is normal.  Upper mediastinal contours are within normal limits.  IMPRESSION: 1.  Interval placement of a biventricular pacemaker device which appears properly located, as above.  No associated pneumothorax or other acute complicating features.   Original Report Authenticated By: Trudie Reed, M.D.     Discharge Medications     Medication List    TAKE these medications       albuterol 108 (90 BASE) MCG/ACT inhaler  Commonly known as:  PROVENTIL HFA;VENTOLIN HFA  Inhale 2 puffs into the lungs every 4 (four) hours as needed for wheezing.     allopurinol 100 MG tablet  Commonly known as:  ZYLOPRIM  Take 100 mg by mouth daily.     aspirin EC 81 MG tablet  Take 81 mg by mouth daily.     carvedilol 12.5 MG tablet  Commonly known as:  COREG  Take 12.5  mg by mouth daily.     furosemide 20 MG tablet  Commonly known as:  LASIX  Take 20 mg by mouth 2 (two) times daily.     multivitamin with minerals Tabs  Take 1 tablet by mouth daily.     niacin 500 MG tablet  Take 500 mg by mouth daily with breakfast.     omeprazole 20 MG capsule  Commonly known as:  PRILOSEC  Take 20 mg by mouth daily.     simvastatin 80 MG tablet  Commonly known as:  ZOCOR  Take 80 mg by mouth at bedtime.         Disposition   The patient will be discharged in stable condition to home.     Discharge Orders   Future Appointments Provider Department Dept Phone   11/28/2012 7:00 AM [facility] NUCLEAR MEDICINE [phone]   NPO 6 hrs prior to scheduled exam No Opiate-based MEDS 6 hrs prior Peg Tube solutions: D/C for 6 hrs NPO after midnight   Future Orders Complete By Expires     Diet - low sodium heart healthy  As directed     Discharge instructions  As directed     Comments:      For patients with this heart failure, we give them these special instructions:  1. Follow a low-salt diet and watch your fluid intake. In general, you should not be taking in more than 2 liters of fluid per day. Some patients are restricted to less than 1.5 liters. This includes sources of water in foods like soup. 2. Weigh yourself on the same scale at same time of day and keep a log. 3. Call your doctor: (Anytime you feel any of the following symptoms)  - 3-4 pound weight gain in 1-2 days or 2 pounds overnight  - Shortness of breath, with or without a dry hacking cough  - Swelling in the hands, feet or stomach  - If you have to sleep on extra pillows at night in order to breathe    Increase activity slowly  As directed     Comments:      Please see attached sheet at end of After-Visit Summary for instructions on wound care, activity, and bathing.      Follow-up Information   Follow up with [name], MD. (Our office will call you to schedule a follow-up wound check and device check with Dr. Ladona Ridgel. Call office if you have not heard from [place] within 3-4 business days.)    Contact information:   [address] [place] [address] [phone]       Follow up with Governor Rooks, MD. (Please call his office to find out when he would like you to be seen next.)    Contact information:   [address] [place] [address] [phone]          Duration of Discharge Encounter: Greater than 30 minutes including physician and PA time.  Signed, Ronie Spies PA-C 11/15/2012, 12:55 PM

## 2012-11-15 NOTE — Progress Notes (Addendum)
Subjective:  The patient is doing well post biventricular pacemaker upgrade yesterday by Dr. Ladona Ridgel.  His chest x-ray this morning is satisfactory.  He has been checked this morning by the [facility]. Jude pacemaker rep and his device is working properly.  Patient is anxious for discharge home.  Objective:  Vital Signs in the last 24 hours: Temp:  [97.3 F (36.3 C)-97.9 F (36.6 C)] 97.9 F (36.6 C) (03/08 0500) Pulse Rate:  [74-78] 74 (03/08 1007) Resp:  [16-18] 16 (03/08 0500) BP: (116-139)/(81-97) 137/91 mmHg (03/08 1007) SpO2:  [92 %-95 %] 95 % (03/08 0500)  Intake/Output from previous day: 03/07 0701 - 03/08 0700 In: -  Out: 1200 [Urine:1200] Intake/Output from this shift:    . allopurinol  100 mg Oral Daily  . aspirin EC  81 mg Oral Daily  . atorvastatin  40 mg Oral q1800  . carvedilol  12.5 mg Oral Daily  . furosemide  20 mg Oral BID  . pantoprazole  40 mg Oral Daily      Physical Exam: The patient appears to be in no distress.  Head and neck exam reveals that the pupils are equal and reactive.  The extraocular movements are full.  There is no scleral icterus.  Mouth and pharynx are benign.  No lymphadenopathy.  No carotid bruits.  The jugular venous pressure is normal.  Thyroid is not enlarged or tender.  Chest is clear to percussion and auscultation.  No rales or rhonchi.  Expansion of the chest is symmetrical.  The pacer site shows no evidence of ecchymosis or infection or cellulitis.  Heart reveals no abnormal lift or heave.  First and second heart sounds are normal.  There is no  gallop rub or click.  There is a grade 2/6 systolic ejection murmur at the lower left sternal edge.  The abdomen is soft and nontender.  Bowel sounds are normoactive.  There is no hepatosplenomegaly or mass.  There are no abdominal bruits.  Extremities reveal no phlebitis or edema.  Pedal pulses are good.  There is no cyanosis or clubbing.  Neurologic exam is normal strength and no  lateralizing weakness.  No sensory deficits.  Integument reveals no rash  Lab Results: No results found for this basename: WBC, HGB, PLT,  in the last 72 hours No results found for this basename: NA, K, CL, CO2, GLUCOSE, BUN, CREATININE,  in the last 72 hours No results found for this basename: TROPONINI, CK, MB,  in the last 72 hours Hepatic Function Panel No results found for this basename: PROT, ALBUMIN, AST, ALT, ALKPHOS, BILITOT, BILIDIR, IBILI,  in the last 72 hours No results found for this basename: CHOL,  in the last 72 hours No results found for this basename: PROTIME,  in the last 72 hours  Imaging: Dg Chest 2 View  11/15/2012  *RADIOLOGY REPORT*  Clinical Data: Pacemaker implant.  CHEST - 2 VIEW  Comparison: Chest x-ray 08/26/2012.  Findings: Left-sided biventricular pacemaker are in place with lead tips projecting over the expected location of the right atrium, right ventricular apex and overlying the lateral wall left ventricle via the coronary sinus and coronary veins.  Moderate cardiomegaly is again noted.  No pneumothorax.  Lungs are clear. Pulmonary vasculature is normal.  Upper mediastinal contours are within normal limits.  IMPRESSION: 1.  Interval placement of a biventricular pacemaker device which appears properly located, as above.  No associated pneumothorax or other acute complicating features.   Original Report Authenticated By: Reuel Boom  Entrikin, M.D.     Cardiac Studies: Telemetry shows atrial sensing ventricular pacing. Assessment/Plan:  1.  History of complete heart block, chronic.  New biventricular upgrade of St. Jude pacemaker yesterday. 2.  chronic systolic congestive heart failure class III 3.  Hypertension 4.  Hyperlipidemia  Plan: Okay for discharge today.  Followup in pacemaker followup wound clinic 1 week  LOS: 1 day    Cassell Clement 11/15/2012, 11:14 AM

## 2012-11-17 MED FILL — Cefazolin Sodium for IV Soln 2 GM and Dextrose 3% (50 ML): INTRAVENOUS | Qty: 50 | Status: AC

## 2012-11-20 ENCOUNTER — Other Ambulatory Visit (HOSPITAL_COMMUNITY): Payer: Federal, State, Local not specified - PPO

## 2012-11-24 ENCOUNTER — Other Ambulatory Visit: Payer: Self-pay | Admitting: Internal Medicine

## 2012-11-24 ENCOUNTER — Ambulatory Visit (INDEPENDENT_AMBULATORY_CARE_PROVIDER_SITE_OTHER): Payer: Federal, State, Local not specified - PPO | Admitting: *Deleted

## 2012-11-24 ENCOUNTER — Encounter: Payer: Self-pay | Admitting: Internal Medicine

## 2012-11-24 DIAGNOSIS — I442 Atrioventricular block, complete: Secondary | ICD-10-CM

## 2012-11-24 DIAGNOSIS — I5022 Chronic systolic (congestive) heart failure: Secondary | ICD-10-CM

## 2012-11-24 LAB — PACEMAKER DEVICE OBSERVATION
AL THRESHOLD: 0.75 V
BAMS-0001: 150 {beats}/min
DEVICE MODEL PM: 2882239
LV LEAD THRESHOLD: 0.75 V
RV LEAD AMPLITUDE: 5.1 mv

## 2012-11-24 NOTE — Progress Notes (Signed)
Wound check-PPM 

## 2012-11-28 ENCOUNTER — Encounter (HOSPITAL_COMMUNITY)
Admission: RE | Admit: 2012-11-28 | Discharge: 2012-11-28 | Disposition: A | Discharge status: Home / Self Care | Payer: Federal, State, Local not specified - PPO | Source: Ambulatory Visit | Attending: Family Medicine | Admitting: Family Medicine

## 2012-11-28 DIAGNOSIS — R112 Nausea with vomiting, unspecified: Secondary | ICD-10-CM | POA: Insufficient documentation

## 2012-11-28 DIAGNOSIS — R109 Unspecified abdominal pain: Secondary | ICD-10-CM | POA: Insufficient documentation

## 2012-11-28 DIAGNOSIS — R634 Abnormal weight loss: Secondary | ICD-10-CM | POA: Insufficient documentation

## 2012-11-28 MED ORDER — TECHNETIUM TC 99M MEBROFENIN IV KIT
5.0000 | PACK | Freq: Once | INTRAVENOUS | Status: AC | PRN
  Administered 2012-11-28: 5 via INTRAVENOUS

## 2012-12-03 NOTE — Op Note (Signed)
NAME:  Russell Martin, Russell Martin NO.:  [id number]  MEDICAL RECORD NO.:  [id number]  LOCATION:  NUC                          FACILITY:  [facility]  PHYSICIAN:  Doylene Canning. Ladona Ridgel, MD    DATE OF BIRTH:  [birth year]  DATE OF PROCEDURE:  11/14/2012 DATE OF DISCHARGE:                              OPERATIVE REPORT   ADDENDUM  The date of initial operation was 11/14/2012, the addendum is 12/02/2012.  At this point, electrocautery and blunt dissection was utilized to dissect down through the pectoralis major in creating a subpectoral pocket for this patient.  This was carried out without complication. The old dual-chamber pacemaker was removed without difficulty.  The rest of the report should read as dictated, no other changes needed.     Doylene Canning. Ladona Ridgel, MD     GWT/MEDQ  D:  12/02/2012  T:  12/03/2012  Job:  [id number]

## 2013-01-12 LAB — PACEMAKER DEVICE OBSERVATION

## 2013-01-13 ENCOUNTER — Other Ambulatory Visit (HOSPITAL_COMMUNITY): Payer: Self-pay | Admitting: Cardiovascular Disease

## 2013-01-13 DIAGNOSIS — I509 Heart failure, unspecified: Secondary | ICD-10-CM

## 2013-01-13 DIAGNOSIS — Q233 Congenital mitral insufficiency: Secondary | ICD-10-CM

## 2013-01-27 ENCOUNTER — Ambulatory Visit (HOSPITAL_COMMUNITY)
Admission: RE | Admit: 2013-01-27 | Discharge: 2013-01-27 | Disposition: A | Discharge status: Home / Self Care | Payer: Federal, State, Local not specified - PPO | Source: Ambulatory Visit | Attending: Internal Medicine | Admitting: Internal Medicine

## 2013-01-27 DIAGNOSIS — I059 Rheumatic mitral valve disease, unspecified: Secondary | ICD-10-CM | POA: Insufficient documentation

## 2013-01-27 DIAGNOSIS — I270 Primary pulmonary hypertension: Secondary | ICD-10-CM | POA: Insufficient documentation

## 2013-01-27 DIAGNOSIS — I359 Nonrheumatic aortic valve disorder, unspecified: Secondary | ICD-10-CM | POA: Insufficient documentation

## 2013-01-27 DIAGNOSIS — I079 Rheumatic tricuspid valve disease, unspecified: Secondary | ICD-10-CM | POA: Insufficient documentation

## 2013-01-27 DIAGNOSIS — R0989 Other specified symptoms and signs involving the circulatory and respiratory systems: Secondary | ICD-10-CM | POA: Insufficient documentation

## 2013-01-27 DIAGNOSIS — Q233 Congenital mitral insufficiency: Secondary | ICD-10-CM

## 2013-01-27 DIAGNOSIS — I509 Heart failure, unspecified: Secondary | ICD-10-CM | POA: Insufficient documentation

## 2013-01-27 DIAGNOSIS — I379 Nonrheumatic pulmonary valve disorder, unspecified: Secondary | ICD-10-CM | POA: Insufficient documentation

## 2013-01-27 DIAGNOSIS — R0609 Other forms of dyspnea: Secondary | ICD-10-CM | POA: Insufficient documentation

## 2013-01-27 NOTE — Progress Notes (Signed)
2D Echo Performed 01/27/2013    Essynce Munsch, RCS  

## 2013-02-17 ENCOUNTER — Encounter: Payer: Self-pay | Admitting: Internal Medicine

## 2013-02-17 ENCOUNTER — Encounter: Payer: Self-pay | Admitting: Emergency Medicine

## 2013-02-17 ENCOUNTER — Ambulatory Visit (INDEPENDENT_AMBULATORY_CARE_PROVIDER_SITE_OTHER): Payer: Federal, State, Local not specified - PPO | Admitting: Internal Medicine

## 2013-02-17 VITALS — BP 150/90 | HR 76 | Ht 70.5 in | Wt 188.0 lb

## 2013-02-17 DIAGNOSIS — Z950 Presence of cardiac pacemaker: Secondary | ICD-10-CM

## 2013-02-17 DIAGNOSIS — I10 Essential (primary) hypertension: Secondary | ICD-10-CM

## 2013-02-17 DIAGNOSIS — I5022 Chronic systolic (congestive) heart failure: Secondary | ICD-10-CM

## 2013-02-17 DIAGNOSIS — I442 Atrioventricular block, complete: Secondary | ICD-10-CM

## 2013-02-17 LAB — PACEMAKER DEVICE OBSERVATION
AL AMPLITUDE: 2.4 mv
DEVICE MODEL PM: 2882239
LV LEAD THRESHOLD: 0.75 V
RV LEAD IMPEDENCE PM: 275 Ohm
RV LEAD THRESHOLD: 1 V

## 2013-02-17 MED ORDER — LOSARTAN POTASSIUM 25 MG PO TABS: 25.0000 mg | ORAL_TABLET | Freq: Every day | ORAL | Status: DC

## 2013-02-17 NOTE — Assessment & Plan Note (Signed)
Hopefully this will improve with a low-sodium diet and initiation of losartan.

## 2013-02-17 NOTE — Assessment & Plan Note (Signed)
His St. Jude biventricular pacemaker is working normally. Left ventricular pacing threshold is less than a full 0.5 ms. His underlying rhythm is still complete heart block. He had an escape rhythm of 32 beats per minute. We'll plan to recheck his pacemaker in several months.

## 2013-02-17 NOTE — Progress Notes (Signed)
HPI [name] returns today for followup. He is a very pleasant 62 year old man with complete heart block, status post permanent pacemaker insertion, who developed worsening left ventricular dysfunction. He had a paced QRS duration of 200 ms, and underwent biventricular upgrade several months ago. Repeat echo demonstrated improvement in his left ventricular function. His ejection fraction increased from 30% to 40%. The patient continues to have dyspnea. His echocardiogram demonstrated moderate to severe mitral regurgitation. His pulmonary pressures are elevated. He has not had syncope.  He does have hypertension, and is on a beta blocker, but no afterload reduction. No Known Allergies   Current Outpatient Prescriptions  Medication Sig Dispense Refill  . allopurinol (ZYLOPRIM) 100 MG tablet Take 100 mg by mouth daily.      [name] aspirin EC 81 MG tablet Take 81 mg by mouth daily.      . carvedilol (COREG) 12.5 MG tablet Take 12.5 mg by mouth daily.      [name] DIGOX 125 MCG tablet daily      . furosemide (LASIX) 20 MG tablet Take 20 mg by mouth 2 (two) times daily.      . Multiple Vitamin (MULTIVITAMIN WITH MINERALS) TABS Take 1 tablet by mouth daily.      . niacin 500 MG tablet Take 500 mg by mouth daily with breakfast.      . potassium chloride (K-DUR) 10 MEQ tablet       . simvastatin (ZOCOR) 80 MG tablet Take 80 mg by mouth at bedtime.       No current facility-administered medications for this visit.     Past Medical History  Diagnosis Date  . CHF (congestive heart failure)     a. NICM EF 35% - no CAD by cath 09/2012. b. s/p upgrade to Bi-V pacemaker 11/2012 (did not get ICD as Dr. Ladona Ridgel expects his EF will improve with bi-V pacing).  . Hyperlipidemia   . Hypertension     pt denies HTN  . CHB (complete heart block)     a. Pacer initially placed ~2002.     ROS:   All systems reviewed and negative except as noted in the HPI.   Past Surgical History  Procedure Laterality Date  .  Pacemaker placement       No family history on file.   History   Social History  . Marital Status: Married    Spouse Name: N/A    Number of Children: N/A  . Years of Education: N/A   Occupational History  . Not on file.   Social History Main Topics  . Smoking status: Former Games developer  . Smokeless tobacco: Not on file  . Alcohol Use: No  . Drug Use: No  . Sexually Active: Yes   Other Topics Concern  . Not on file   Social History Narrative  . No narrative on file     BP 150/90  Pulse 76  Ht 5' 10.5" (1.791 m)  Wt 188 lb (85.276 kg)  BMI 26.58 kg/m2  Physical Exam:  Well appearing middle-aged man,NAD HEENT: Unremarkable Neck:  7 cm JVD, no thyromegally Lungs:  Clear with no wheezes, rales, or rhonchi. Well-healed pacemaker incision. HEART:  Regular rate rhythm, no murmurs, no rubs, no clicks Abd:  soft, positive bowel sounds, no organomegally, no rebound, no guarding Ext:  2 plus pulses, no edema, no cyanosis, no clubbing Skin:  No rashes no nodules Neuro:  CN II through XII intact, motor grossly intact   DEVICE  Normal  device function.  See PaceArt for details.   Assess/Plan:

## 2013-02-17 NOTE — Assessment & Plan Note (Signed)
His chronic systolic heart failure is still class IIb. He has dyspnea with exertion. Repeat echocardiography demonstrated improvement in left ventricular function, but moderate to severe mitral regurgitation. This was not commented on during his catheterization. My plan today is to start losartan. He will continue his diuretics. He is encouraged to maintain a low-sodium diet. I suspect she will need followup in our heart failure clinic. We'll plan to have him back to check his labs to make sure his potassium does not rise.

## 2013-02-17 NOTE — Patient Instructions (Addendum)
Your physician wants you to follow-up in: March 2015 with Dr Court Joy will receive a reminder letter in the mail two months in advance. If you don't receive a letter, please call our office to schedule the follow-up appointment.   You have been referred to [facility]  Your physician has recommended you make the following change in your medication:  1) Start Cozaar 25mg  daily  Your physician recommends that you return for lab work in: 10days: BMP

## 2013-02-27 ENCOUNTER — Other Ambulatory Visit (INDEPENDENT_AMBULATORY_CARE_PROVIDER_SITE_OTHER): Payer: Federal, State, Local not specified - PPO

## 2013-02-27 ENCOUNTER — Other Ambulatory Visit: Payer: Self-pay

## 2013-02-27 DIAGNOSIS — I5022 Chronic systolic (congestive) heart failure: Secondary | ICD-10-CM

## 2013-02-27 LAB — BASIC METABOLIC PANEL
BUN: 17 mg/dL (ref 6–23)
Calcium: 9.3 mg/dL (ref 8.4–10.5)
GFR: 73.68 mL/min (ref >60.00)
Glucose, Bld: 103 mg/dL — ABNORMAL HIGH (ref 70–99)
Potassium: 3.9 mEq/L (ref 3.5–5.1)

## 2013-02-27 MED ORDER — NIACIN 500 MG PO TABS: 500.0000 mg | ORAL_TABLET | Freq: Every day | ORAL | Status: DC

## 2013-02-27 NOTE — Telephone Encounter (Signed)
Rx was sent to pharmacy electronically via [employer].  

## 2013-03-03 ENCOUNTER — Telehealth: Payer: Self-pay | Admitting: Internal Medicine

## 2013-03-03 ENCOUNTER — Ambulatory Visit (HOSPITAL_COMMUNITY)
Admission: RE | Admit: 2013-03-03 | Discharge: 2013-03-03 | Disposition: A | Discharge status: Home / Self Care | Payer: Federal, State, Local not specified - PPO | Source: Ambulatory Visit | Attending: Internal Medicine | Admitting: Internal Medicine

## 2013-03-03 ENCOUNTER — Encounter (HOSPITAL_COMMUNITY): Payer: Self-pay

## 2013-03-03 VITALS — BP 120/78 | HR 70 | Wt 177.1 lb

## 2013-03-03 DIAGNOSIS — I5020 Unspecified systolic (congestive) heart failure: Secondary | ICD-10-CM | POA: Insufficient documentation

## 2013-03-03 DIAGNOSIS — Z79899 Other long term (current) drug therapy: Secondary | ICD-10-CM | POA: Insufficient documentation

## 2013-03-03 DIAGNOSIS — Z7982 Long term (current) use of aspirin: Secondary | ICD-10-CM | POA: Insufficient documentation

## 2013-03-03 DIAGNOSIS — E785 Hyperlipidemia, unspecified: Secondary | ICD-10-CM | POA: Insufficient documentation

## 2013-03-03 DIAGNOSIS — I509 Heart failure, unspecified: Secondary | ICD-10-CM | POA: Insufficient documentation

## 2013-03-03 DIAGNOSIS — I428 Other cardiomyopathies: Secondary | ICD-10-CM | POA: Insufficient documentation

## 2013-03-03 DIAGNOSIS — I059 Rheumatic mitral valve disease, unspecified: Secondary | ICD-10-CM | POA: Insufficient documentation

## 2013-03-03 DIAGNOSIS — I5022 Chronic systolic (congestive) heart failure: Secondary | ICD-10-CM

## 2013-03-03 DIAGNOSIS — M109 Gout, unspecified: Secondary | ICD-10-CM | POA: Insufficient documentation

## 2013-03-03 DIAGNOSIS — I10 Essential (primary) hypertension: Secondary | ICD-10-CM | POA: Insufficient documentation

## 2013-03-03 DIAGNOSIS — Z950 Presence of cardiac pacemaker: Secondary | ICD-10-CM | POA: Insufficient documentation

## 2013-03-03 DIAGNOSIS — I340 Nonrheumatic mitral (valve) insufficiency: Secondary | ICD-10-CM | POA: Insufficient documentation

## 2013-03-03 DIAGNOSIS — Z87891 Personal history of nicotine dependence: Secondary | ICD-10-CM | POA: Insufficient documentation

## 2013-03-03 NOTE — Progress Notes (Signed)
Patient ID: Russell Martin, male   DOB: [birth year], 62 y.o.   MRN: [id number] EP: Dr Ladona Ridgel PCP: Dr Jillyn Hidden  HPI: Russell Martin is referred to the HF clinic by Dr Ladona Ridgel for further evaluation of HF and severe mitral regurgitation.  Russell Martin is a 62 year old with PMH of systolic HF due to NICM (EF 35%) s/p BiVICD, complete heart block 2001 S/P permanent pacemaker, gout, HTN and hyperlipidemia. He was previously followed by Dr. Alanda Amass,  09/2012 [facility] No CAD. RHC RA: 11  RV: 58-60/10  PA: 60/28; mean 44  PCWP: 17; v wave 25  LV: 116/12  CO: 5.4 (Thermo); 5.3 l/m  CI: 2.6 2.5 l/m/m2    01/27/13 ECHO EF 45-45% Severe Russell Mod-Severe TR  He says he started feeling bad last August. SOB/Orthopnea intensified. Eventually started on lasix. Underwent cath in 1/14 as above/  He presents today as a new patient with his wife. He was last evaluated by Dr Ladona Ridgel 02/17/13 and started on losartan 25 mg daily. Overall he feels much better. Denies SOB/PND/Orthopnea. Denies dizziness. Able to climb steps without difficulty. Weight at home trending down 177- 181 pounds. Works full time as a Engineer, materials at [employer].    Review of Systems:     Cardiac Review of Systems: {Y] = yes [ ]  = no  Chest Pain [    ]  Resting SOB [   ] Exertional SOB  [  ]  Orthopnea [  ]   Pedal Edema [   ]    Palpitations [  ] Syncope  [  ]   Presyncope [   ]  General Review of Systems: [Y] = yes [  ]=no Constitional: recent weight change [ Y ]; anorexia [  ]; fatigue [  ]; nausea [  ]; night sweats [  ]; fever [  ]; or chills [  ];                                                                                                                                          Dental: poor dentition[  ]; Last Dentist visit:   Eye : blurred vision [  ]; diplopia [   ]; vision changes [  ];  Amaurosis fugax[  ]; Resp: cough [  ];  wheezing[  ];  hemoptysis[  ]; shortness of breath[  ]; paroxysmal nocturnal dyspnea[  ]; dyspnea on exertion[  ]; or  orthopnea[  ];  GI:  gallstones[  ], vomiting[  ];  dysphagia[  ]; melena[  ];  hematochezia [  ]; heartburn[  ];   Hx of  Colonoscopy[  ]; GU: kidney stones [  ]; hematuria[  ];   dysuria [  ];  nocturia[  ];  history of     obstruction [  ];  Skin: rash, swelling[  ];, hair loss[  ];  peripheral edema[  ];  or itching[  ]; Musculosketetal: myalgias[  ];  joint swelling[  ];  joint erythema[  ];  joint pain[  ];  back pain[  ];  Heme/Lymph: bruising[  ];  bleeding[  ];  anemia[  ];  Neuro: TIA[  ];  headaches[  ];  stroke[  ];  vertigo[  ];  seizures[  ];   paresthesias[  ];  difficulty walking[  ];  Psych:depression[  ]; anxiety[  ];  Endocrine: diabetes[  ];  thyroid dysfunction[  ];  Immunizations: Flu [  ]; Pneumococcal[  ];  Other:    Past Medical History  Diagnosis Date  . CHF (congestive heart failure)     a. NICM EF 35% - no CAD by cath 09/2012. b. s/p upgrade to Bi-V pacemaker 11/2012 (did not get ICD as Dr. Ladona Ridgel expects his EF will improve with bi-V pacing).  . Hyperlipidemia   . Hypertension     pt denies HTN  . CHB (complete heart block)     a. Pacer initially placed ~2002.     Current Outpatient Prescriptions  Medication Sig Dispense Refill  . allopurinol (ZYLOPRIM) 100 MG tablet Take 100 mg by mouth daily.      [name] aspirin EC 81 MG tablet Take 81 mg by mouth daily.      . carvedilol (COREG) 12.5 MG tablet Take 12.5 mg by mouth daily.      [name] DIGOX 125 MCG tablet daily      . furosemide (LASIX) 20 MG tablet Take 20 mg by mouth 2 (two) times daily.      [name] losartan (COZAAR) 25 MG tablet Take 1 tablet (25 mg total) by mouth daily.  90 tablet  3  . Multiple Vitamin (MULTIVITAMIN WITH MINERALS) TABS Take 1 tablet by mouth daily.      . niacin 500 MG tablet Take 1 tablet (500 mg total) by mouth daily with breakfast.  30 tablet  10  . simvastatin (ZOCOR) 80 MG tablet Take 80 mg by mouth at bedtime.       No current facility-administered medications for this  encounter.     No Known Allergies  History   Social History  . Marital Status: Married    Spouse Name: N/A    Number of Children: N/A  . Years of Education: N/A   Occupational History  . Not on file.   Social History Main Topics  . Smoking status: Former Games developer  . Smokeless tobacco: Not on file  . Alcohol Use: No  . Drug Use: No  . Sexually Active: Yes   Other Topics Concern  . Not on file   Social History Narrative  . No narrative on file    No family history on file.  PHYSICAL EXAM: Filed Vitals:   03/03/13 1314  BP: 120/78  Pulse: 70   General:  Well appearing. No respiratory difficulty HEENT: normal Neck: supple. JVP 7-8 with prominent CV waves. Carotids 2+ bilat; no bruits. No lymphadenopathy or thryomegaly appreciated. Cor: PMI nondisplaced. Regular rate & rhythm. No rubs, gallops. Russell 3/6 TR 3/6. Lungs: clear Abdomen: soft, nontender, nondistended. No hepatosplenomegaly. No bruits or masses. Good bowel sounds. Extremities: no cyanosis, clubbing, rash, edema Neuro: alert & oriented x 3, cranial nerves grossly intact. moves all 4 extremities w/o difficulty. Affect pleasant.    No results found for this or any previous visit (from the past 24  hour(s)). No results found.   ASSESSMENT & PLAN:

## 2013-03-03 NOTE — Patient Instructions (Addendum)
We will refer you to cardiac surgeon  Follow up in 2 months

## 2013-03-03 NOTE — Assessment & Plan Note (Addendum)
Volume status stable. Dr Gala Romney reviewed and discussed ECHO from May. Severe MR and mod-severe TR. Will refer to cardiac surgeon.   Patient seen and examined with Tonye Becket, NP. We discussed all aspects of the encounter. I agree with the assessment and plan as stated above. I have reviewed his echo personally EF has improved with BiV upgrade but he has severe MR due to a dilated annulus as well as moderate to severe TR. I think the only durable option for him is to be referred for TV and MV rings. We talked about this at length and I explained that there was some risk to this but I also explained that if left untreated he would likely get much worse with in a year or so. Both he and his wife agree to referral to [facility] to discuss surgery. I explained that he will likely need a pre-op TEE.

## 2013-03-03 NOTE — Assessment & Plan Note (Addendum)
Refer to cardiac surgeon  Attending: This is severe. See my notes under the HF section.

## 2013-03-03 NOTE — Telephone Encounter (Signed)
Release faxed to [facility], records received From [facility] gave to [facility] 03/03/13/KM

## 2013-03-11 ENCOUNTER — Encounter: Payer: Self-pay | Admitting: Thoracic Surgery (Cardiothoracic Vascular Surgery)

## 2013-03-11 ENCOUNTER — Encounter: Payer: Federal, State, Local not specified - PPO | Admitting: Surgery

## 2013-03-11 ENCOUNTER — Institutional Professional Consult (permissible substitution) (INDEPENDENT_AMBULATORY_CARE_PROVIDER_SITE_OTHER): Payer: Federal, State, Local not specified - PPO | Admitting: Thoracic Surgery (Cardiothoracic Vascular Surgery)

## 2013-03-11 VITALS — BP 129/74 | HR 65 | Resp 16 | Ht 70.5 in | Wt 176.5 lb

## 2013-03-11 DIAGNOSIS — I340 Nonrheumatic mitral (valve) insufficiency: Secondary | ICD-10-CM | POA: Insufficient documentation

## 2013-03-11 DIAGNOSIS — I509 Heart failure, unspecified: Secondary | ICD-10-CM

## 2013-03-11 DIAGNOSIS — I059 Rheumatic mitral valve disease, unspecified: Secondary | ICD-10-CM

## 2013-03-11 DIAGNOSIS — I071 Rheumatic tricuspid insufficiency: Secondary | ICD-10-CM | POA: Insufficient documentation

## 2013-03-11 DIAGNOSIS — I5022 Chronic systolic (congestive) heart failure: Secondary | ICD-10-CM

## 2013-03-11 DIAGNOSIS — I079 Rheumatic tricuspid valve disease, unspecified: Secondary | ICD-10-CM

## 2013-03-11 NOTE — Progress Notes (Signed)
     [address]       [name] [address]             [phone]     CARDIOTHORACIC SURGERY CONSULTATION REPORT  Referring Provider is Bensimhon, Daniel R, MD PCP is [name], [name], MD  Chief Complaint  Patient presents with  . Mitral Regurgitation    and Tricuspid Vave Regurg....eval and treat...ECHO 01/27/13.....last heart CATH 09/19/12  . Congestive Heart Failure    HPI:  Patient is a 62-year-old African American male with long-standing history of dilated nonischemic cardiomyopathy and chronic combined systolic and diastolic congestive heart failure. The patient states that he first presented in 2001 with complete heart block and underwent placement of a permanent pacemaker. Until recently he had been followed by Dr. Weintraub. In 2012 he began to develop progressive symptoms of congestive heart failure.  Symptoms gradually progressed until this past December when he want up in the emergency department with class IV congestive heart failure pulmonary edema. He responded quickly to diuretic therapy. He ultimately was referred to Dr. Taylor for biventricular pacemaker upgrade which was performed in March of this year.  With biventricular pacing the patient's symptoms of congestive heart failure has stabilized nicely, but followup echocardiogram performed in may revealed the presence of severe mitral regurgitation and severe tricuspid regurgitation.  The patient recently was referred to the congestive heart failure clinic where he was evaluated by Dr. Bensimhon on 03/03/2013.  Based upon review of the patient's most recent echocardiogram, Dr. Bensimhon felt that the patient might benefit from elective surgical intervention for mitral and tricuspid regurgitation. The patient has been referred for surgical consultation.  At present the patient reports stable symptoms of exertional shortness of breath which typically only occur with moderate physical activity, functional class  II. He denies resting shortness of breath, PND, orthopnea, or lower extremity edema. He states that these symptoms have all remained reasonably stable and improved since he began biventricular pacing last March. He has never had any chest pain or chest tightness. He has not had any tachypalpitations or dizzy spells.  Past Medical History  Diagnosis Date  . CHF (congestive heart failure)     a. NICM EF 35% - no CAD by cath 09/2012. b. s/p upgrade to Bi-V pacemaker 11/2012 (did not get ICD as Dr. Taylor expects his EF will improve with bi-V pacing).  . Hyperlipidemia   . Hypertension     pt denies HTN  . CHB (complete heart block)     a. Pacer initially placed ~2002.     Past Surgical History  Procedure Laterality Date  . Pacemaker placement      No family history on file.  History   Social History  . Marital Status: Married    Spouse Name: N/A    Number of Children: N/A  . Years of Education: N/A   Occupational History  . Not on file.   Social History Main Topics  . Smoking status: Former Smoker  . Smokeless tobacco: Never Used  . Alcohol Use: No  . Drug Use: No  . Sexually Active: Yes   Other Topics Concern  . Not on file   Social History Narrative  . No narrative on file    Current Outpatient Prescriptions  Medication Sig Dispense Refill  . allopurinol (ZYLOPRIM) 100 MG tablet Take 100 mg by mouth daily.      . aspirin EC 81 MG tablet Take 81 mg by mouth daily.      .   carvedilol (COREG) 12.5 MG tablet Take 12.5 mg by mouth daily.      . DIGOX 125 MCG tablet daily      . furosemide (LASIX) 20 MG tablet Take 20 mg by mouth 2 (two) times daily.      . losartan (COZAAR) 25 MG tablet Take 1 tablet (25 mg total) by mouth daily.  90 tablet  3  . Multiple Vitamin (MULTIVITAMIN WITH MINERALS) TABS Take 1 tablet by mouth daily.      . niacin 500 MG tablet Take 1 tablet (500 mg total) by mouth daily with breakfast.  30 tablet  10  . simvastatin (ZOCOR) 80 MG tablet Take 80  mg by mouth at bedtime.       No current facility-administered medications for this visit.    No Known Allergies    Review of Systems:   General:  normal appetite, normal energy, no weight gain, no weight loss, no fever  Cardiac:  no chest pain with exertion, no chest pain at rest, + SOB with moderate exertion, no resting SOB, no PND, no orthopnea, no palpitations, no arrhythmia, no atrial fibrillation, no LE edema, no dizzy spells, no syncope  Respiratory:  + exertional shortness of breath, no home oxygen, no productive cough, occasional dry cough, no bronchitis, no wheezing, no hemoptysis, no asthma, no pain with inspiration or cough, no sleep apnea, no CPAP at night  GI:   no difficulty swallowing, no reflux, no frequent heartburn, no hiatal hernia, no abdominal pain, no constipation, no diarrhea, no hematochezia, no hematemesis, no melena  GU:   no dysuria,  + frequency, no urinary tract infection, no hematuria, no enlarged prostate, no kidney stones, mild kidney disease  Vascular:  no pain suggestive of claudication, + pain in feet, no leg cramps, no varicose veins, no DVT, no non-healing foot ulcer  Neuro:   no stroke, no TIA's, no seizures, no headaches, no temporary blindness one eye,  no slurred speech, no peripheral neuropathy, no chronic pain, no instability of gait, no memory/cognitive dysfunction  Musculoskeletal: no arthritis, no joint swelling, no myalgias, minor difficulty walking, normal mobility   Skin:   no rash, no itching, no skin infections, no pressure sores or ulcerations  Psych:   no anxiety, no depression, no nervousness, no unusual recent stress  Eyes:   no blurry vision, no floaters, no recent vision changes, does not wears glasses or contacts  ENT:   no hearing loss, + loose or painful teeth, no dentures, last saw dentist many years ago, teeth in poor condition  Hematologic:  no easy bruising, no abnormal bleeding, no clotting disorder, no frequent  epistaxis  Endocrine:  no diabetes, does not check CBG's at home     Physical Exam:   BP 129/74  Pulse 65  Resp 16  Ht 5' 10.5" (1.791 m)  Wt 176 lb 8 oz (80.06 kg)  BMI 24.96 kg/m2  SpO2 98%  General:    well-appearing  HEENT:  Unremarkable   Neck:   no JVD, no bruits, no adenopathy   Chest:   clear to auscultation, symmetrical breath sounds, no wheezes, no rhonchi   CV:   RRR, grade IV/VI holosystolic murmur   Abdomen:  soft, non-tender, no masses   Extremities:  warm, well-perfused, pulses palpable in groin, no LE edema  Rectal/GU  Deferred  Neuro:   Grossly non-focal and symmetrical throughout  Skin:   Clean and dry, no rashes, no breakdown   Diagnostic Tests:  Transthoracic   Echocardiography  Patient: Martin, Russell T MR #: [id number] Study Date: 01/27/2013 Gender: M Age: 62 Height: 177.8cm Weight: 85.7kg BSA: 2.04m^2 Pt. Status: Room:  ORDERING Richard Weintraub, MD FACC PERFORMING Richard Weintraub, MD FACC REFERRING Richard Weintraub, MD FACC ATTENDING Hilty, Kenneth REFERRING Hilty, Kenneth SONOGRAPHER Tammie Crouch, RCS cc:  ------------------------------------------------------------ LV EF: 40% - 45%  ------------------------------------------------------------ Indications: 428.0 CHF.  ------------------------------------------------------------ History: PMH: Dyspnea. Primary pulmonary hypertension.  ------------------------------------------------------------ Study Conclusions  - Left ventricle: The cavity size was moderately dilated. There was moderate concentric hypertrophy. Systolic function was mildly to moderately reduced. The estimated ejection fraction was in the range of 40% to 45%. Moderate hypokinesis of the anteroseptal myocardium. - Aortic valve: Mild regurgitation. - Mitral valve: There was malcoaptation of the valve leaflets. Severe regurgitation directed eccentrically and posteriorly. - Left atrium: The appendage was  moderately to severely dilated. - Right ventricle: The cavity size was mildly dilated. Systolic pressure was increased. - Right atrium: The appendage was mildly dilated. - Atrial septum: No defect or patent foramen ovale was identified. - Tricuspid valve: Moderate regurgitation. - Pulmonary arteries: PA peak pressure: 58mm Hg (S). Impressions:  - EF improved s/p Bi-V pacing, Severe MR persists. NSR with Bi-V paced rhythm. The right ventricular systolic pressure was increased consistent with moderate pulmonary hypertension.  ------------------------------------------------------------ Labs, prior tests, procedures, and surgery: Permanent pacemaker system implantation.  Transthoracic echocardiography. M-mode, complete 2D, spectral Doppler, and color Doppler. Height: Height: 177.8cm. Height: 70in. Weight: Weight: 85.7kg. Weight: 188.6lb. Body mass index: BMI: 27.1kg/m^2. Body surface area: BSA: 2.04m^2. Blood pressure: 110/70. Patient status: Outpatient. Location: Echo laboratory.  ------------------------------------------------------------  ------------------------------------------------------------ Left ventricle: The cavity size was moderately dilated. There was moderate concentric hypertrophy. Systolic function was mildly to moderately reduced. The estimated ejection fraction wasapproximately 45% . Regional wall motion abnormalities: Moderate hypokinesis of the anteroseptal myocardium. No significant intra-ventricular dyssynchrony,  ------------------------------------------------------------ Aortic valve: Trileaflet. Doppler: There was no stenosis. Mild regurgitation. VTI ratio of LVOT to aortic valve: 0.47. Peak velocity ratio of LVOT to aortic valve: 0.47. Mean gradient: 5mm Hg (S).  ------------------------------------------------------------ Aorta: The aorta was normal, not dilated, and  non-diseased.  ------------------------------------------------------------ Mitral valve: No echocardiographic evidence for prolapse. There was malcoaptation of the valve leaflets. Doppler: Severe regurgitation with atrial reversal, directed eccentrically and posteriorly in modt views. Some views appeared central. Valve area by pressure half-time: 2.97cm^2. Indexed valve area by pressure half-time: 1.46cm^2/m^2. Mean gradient: 4mm Hg (D). Peak gradient: 12mm Hg (D).  ------------------------------------------------------------ Left atrium: LA volume/ BSA = 38.7 ml/m2 The appendage was moderately to severely dilated.  ------------------------------------------------------------ Atrial septum: No defect or patent foramen ovale was identified.  ------------------------------------------------------------ Right ventricle: The cavity size was mildly dilated. Pacer wire or catheter noted in right ventricle. Systolic pressure was increased.  ------------------------------------------------------------ Pulmonic valve: The valve appears to be grossly normal. Doppler: Trivial regurgitation.  ------------------------------------------------------------ Tricuspid valve: Structurally normal valve. Leaflet separation was normal. Doppler: Transvalvular velocity was within the normal range. Moderate regurgitation.  ------------------------------------------------------------ Right atrium: Pacer wire or catheter noted in right atrium. The appendage was mildly dilated.  ------------------------------------------------------------ Pericardium: The pericardium was normal in appearance.  ------------------------------------------------------------ Systemic veins: Inferior vena cava: The vessel was dilated; the respirophasic diameter changes were blunted (< 50%); findings are consistent with elevated central venous pressure. Diameter:  22mm.  ------------------------------------------------------------ Post procedure conclusions Ascending Aorta:  - The aorta was normal, not dilated, and non-diseased.  ------------------------------------------------------------  2D measurements Normal Doppler measurements Normal IVC Main pulmonary Diam 22 mm ------ artery Left ventricle Pressure,   58 mm Hg =30 LVID ED, 63.2 mm 43-52 S chord, Left ventricle PLAX Ea, lat 9.1 cm/s ------ LVID ES, 47.5 mm 23-38 ann, tiss chord, DP PLAX E/Ea, lat 14.9 ------ FS, chord, 25 % >29 ann, tiss 5 PLAX DP LVPW, ED 13.1 mm ------ Ea, med 9.1 cm/s ------ IVS/LVPW 0.87 <1.3 ann, tiss ratio, ED DP Vol ED, 213 ml ------ E/Ea, med 14.9 ------ MOD1 ann, tiss 5 Vol ES, 100 ml ------ DP MOD1 LVOT EF, MOD1 53 % ------ Peak vel, 71.4 cm/s ------ Vol index, 104 ml/m^2 ------ S ED, MOD1 VTI, S 10.6 cm ------ Vol index, 49 ml/m^2 ------ Aortic valve ES, MOD1 Peak vel, 151 cm/s ------ Vol ED, 232 ml ------ S MOD2 Mean vel, 100 cm/s ------ Vol ES, 109 ml ------ S MOD2 VTI, S 22.5 cm ------ EF, MOD2 53 % ------ Mean 5 mm Hg ------ Stroke 123 ml ------ gradient, vol, MOD2 S Vol index, 114 ml/m^2 ------ VTI ratio 0.47 ------ ED, MOD2 LVOT/AV Vol index, 53 ml/m^2 ------ Peak vel 0.47 ------ ES, MOD2 ratio, Stroke 60.3 ml/m^2 ------ LVOT/AV index, Regurg PHT 351 ms ------ MOD2 Mitral valve Ventricular septum Peak E vel 136 cm/s ------ IVS, ED 11.4 mm ------ Peak A vel 50.8 cm/s ------ Aorta Mean vel, 85.5 cm/s ------ Root diam, 28 mm ------ D ED Decelerati 173 ms 150-23 Left atrium on time 0 AP dim 57 mm ------ Pressure 74 ms ------ AP dim 2.79 cm/m^2 <2.2 half-time index Mean 4 mm Hg ------ Right ventricle gradient, RVID ED, 37.6 mm 19-38 D PLAX Peak 12 mm Hg ------ gradient, D Peak E/A 2.7 ------ ratio Area (PHT) 2.97 cm^2 ------ Area index 1.46 cm^2/m ------ (PHT) ^2 Annulus 35.8 cm ------ VTI Regurg 30.8 cm/s ------ alias  vel, PISA Max regurg 624 cm/s ------ vel Regurg VTI 181 cm ------ ERO, PISA 0.31 cm^2 ------ Regurg 56 ml ------ vol, PISA Tricuspid valve Regurg 345 cm/s ------ peak vel Peak RV-RA 48 mm Hg ------ gradient, S Systemic veins Estimated 10 mm Hg ------ CVP Right ventricle Pressure, 58 mm Hg <30 S Sa vel, 16.9 cm/s ------ lat ann, tiss DP  ------------------------------------------------------------ Prepared and Electronically Authenticated by  Richard Weintraub, MD FACC 2014-05-20T09:47:[id number]        CARDIAC CATHETERIZATION   History obtained from chart review and the patient.  PROCEDURE DESCRIPTION:  The patient was brought to the second floor  [facility] cath lab in the postabsorptive state. He was  premedicated with . His right groin  was prepped and shaved in usual sterile fashion. Xylocaine 1% was used  for local anesthesia. A 5 French sheath was inserted into the  RFA and 7 French into the RFV. CO was determined by the Fick and Thermodilution methods. 5 French FL4, FR4, and pigtail catheters were utilized. Hemostasis was obtained by manual compression.  HEMODYNAMICS:  RA: 11  RV: 58-60/10  PA: 60/28; mean 44  PC: 17; v wave 25  AO: 116/68  LV: 116/12  CO: 5.4 (Thermo); 5.3 l/m  CI: 2.6 2.5 l/m/m2  ANGIOGRAPHIC RESULTS:  1. Left main: Nl  2. LAD: Nl  3. Left circumflex: Nl.  4. Right coronary artery: Nl  LV: globally reduced EF 30 -35%  IMPRESSION: Nonischemic Cardiomyopathy  Per Dr. Weintraub's recommendation, I have discussed with Dr.Taylor concerning evaluation for Bi-Ventricular upgrade to see if LV function improves, and may ultimately require ICD.  Thomas A. Kelly, MD, FACC  09/19/2012  11:20 AM       Impression:  Patient has long-standing history of dilated nonischemic cardiomyopathy with chronic combined systolic and diastolic congestive heart failure. Congestive heart failure progressed to the point of class III to class IV  symptoms by the end of this past year, but have recently improved and stabilized since the patient underwent biventricular pacemaker upgrade to commence biventricular pacing. However, followup echocardiogram demonstrates the presence of severe mitral and tricuspid regurgitation related to pure annular dilatation with type I mitral and tricuspid valve dysfunction.  I agree that it would be reasonable to consider elective mitral and tricuspid valve repair or replacement. The patient may be a good candidate for minimally invasive approach for surgery. The patient is noted to have very poor dentition.   Plan:  I reviewed the indications, risks, and potential benefits of mitral valve and tricuspid valve repair or replacement with the patient and his wife in the office today. Rationale for surgical intervention has been discussed. Surgical alternatives have been discussed.  I favor proceeding with transesophageal echocardiogram to further characterize the severity and functional anatomy of both mitral and tricuspid valve disease. We will refer the patient for dental consultation. I expect that he'll need dental extraction. We'll plan to see the patient back within 3-4 weeks after his TEE has been completed to discuss making plans for surgery in the near future.    Clarence H. Owen, MD 03/11/2013 5:08 PM    

## 2013-03-17 ENCOUNTER — Encounter (HOSPITAL_COMMUNITY): Payer: Self-pay | Admitting: Pharmacy Technician

## 2013-03-17 ENCOUNTER — Ambulatory Visit (HOSPITAL_COMMUNITY): Payer: Self-pay | Admitting: Dentistry

## 2013-03-17 ENCOUNTER — Encounter (HOSPITAL_COMMUNITY): Payer: Self-pay | Admitting: Dentistry

## 2013-03-17 VITALS — BP 150/88 | HR 71 | Temp 98.1°F

## 2013-03-17 DIAGNOSIS — K036 Deposits [accretions] on teeth: Secondary | ICD-10-CM

## 2013-03-17 DIAGNOSIS — K0530 Chronic periodontitis, unspecified: Secondary | ICD-10-CM

## 2013-03-17 DIAGNOSIS — K0889 Other specified disorders of teeth and supporting structures: Secondary | ICD-10-CM

## 2013-03-17 DIAGNOSIS — Z0189 Encounter for other specified special examinations: Secondary | ICD-10-CM

## 2013-03-17 DIAGNOSIS — I059 Rheumatic mitral valve disease, unspecified: Secondary | ICD-10-CM

## 2013-03-17 DIAGNOSIS — K08409 Partial loss of teeth, unspecified cause, unspecified class: Secondary | ICD-10-CM

## 2013-03-17 DIAGNOSIS — I340 Nonrheumatic mitral (valve) insufficiency: Secondary | ICD-10-CM

## 2013-03-17 DIAGNOSIS — M264 Malocclusion, unspecified: Secondary | ICD-10-CM

## 2013-03-17 DIAGNOSIS — K029 Dental caries, unspecified: Secondary | ICD-10-CM

## 2013-03-17 DIAGNOSIS — Z954 Presence of other heart-valve replacement: Secondary | ICD-10-CM

## 2013-03-17 NOTE — Progress Notes (Signed)
DENTAL CONSULTATION  Date of Consultation:  03/17/2013 Patient Name:   Russell Martin Date of Birth:   12-Jul-1951 Medical Record Number: [id number]  VITALS: BP 150/88  Pulse 71  Temp(Src) 98.1 F (36.7 C) (Oral)   CHIEF COMPLAINT: Patient was referred by Dr. Tressie Stalker for a dental consultation.  HPI: Russell Martin is a 62 -year-old male recently diagnosed with mitral and tricuspid regurgitation. Patient with anticipated mitral and tricuspid valve replacement or repair with Dr. Cornelius Moras. The patient is now seen as part of a medically necessary pre-heart valve surgery dental protocol to rule out dental infection that may affect the patient's systemic health and anticipated heart valve surgery.  The patient currently denies acute toothache, swellings, or abscesses. The patient was seen in March 2014 by a dentist with [facility] for evaluation of a toothache. The patient had pain of lower left molar that reached an intensity of 8/10 at the time. Patient indicates that the lower left molar now only hurts when " touches it". Patient experiences pain at approximately 3/10 in intensity. Patient indicates that the pain is more of a dull, achy pain.  Patient was prescribed antibiotic therapy at that time. Patient did not have definitive extraction at that time.  Prior to that, the patient has not been seen for approximately 10 years. This was with a dentist " [facility] [place] ". The patient did have upper and lower cast partial denture fabricated approximately 10 years ago. Patient was unable to wear the cast partial dentures delivered at the time of extraction of teeth.   Patient Active Problem List   Diagnosis Date Noted  . MR (mitral regurgitation) 03/11/2013  . Tricuspid valve regurgitation 03/11/2013  . Mitral regurgitation 03/03/2013  . Chronic systolic heart failure 10/21/2012  . Complete heart block 10/21/2012  . Essential hypertension 10/21/2012  . Pacemaker 10/21/2012      PMH: Past  Medical History  Diagnosis Date  . CHF (congestive heart failure)     a. NICM EF 35% - no CAD by cath 09/2012. b. s/p upgrade to Bi-V pacemaker 11/2012 (did not get ICD as Dr. Ladona Ridgel expects his EF will improve with bi-V pacing).  . Hyperlipidemia   . Hypertension     pt denies HTN  . CHB (complete heart block)     a. Pacer initially placed ~2002.     PSH: Past Surgical History  Procedure Laterality Date  . Pacemaker placement      ALLERGIES: No Known Allergies  MEDICATIONS: Current Outpatient Prescriptions  Medication Sig Dispense Refill  . allopurinol (ZYLOPRIM) 100 MG tablet Take 100 mg by mouth daily.      [name] aspirin EC 81 MG tablet Take 81 mg by mouth daily.      . carvedilol (COREG) 12.5 MG tablet Take 12.5 mg by mouth daily.      [name] DIGOX 125 MCG tablet daily      . furosemide (LASIX) 20 MG tablet Take 20 mg by mouth 2 (two) times daily.      [name] losartan (COZAAR) 25 MG tablet Take 1 tablet (25 mg total) by mouth daily.  90 tablet  3  . Multiple Vitamin (MULTIVITAMIN WITH MINERALS) TABS Take 1 tablet by mouth daily.      . niacin 500 MG tablet Take 1 tablet (500 mg total) by mouth daily with breakfast.  30 tablet  10  . simvastatin (ZOCOR) 80 MG tablet Take 80 mg by mouth at bedtime.  No current facility-administered medications for this visit.    LABS: Lab Results  Component Value Date   WBC 6.2 11/05/2012   HGB 13.5 11/05/2012   HCT 42.6 11/05/2012   MCV 81.7 11/05/2012   PLT 168.0 11/05/2012      Component Value Date/Time   NA 140 02/27/2013 0808   K 3.9 02/27/2013 0808   CL 105 02/27/2013 0808   CO2 30 02/27/2013 0808   GLUCOSE 103* 02/27/2013 0808   BUN 17 02/27/2013 0808   CREATININE 1.3 02/27/2013 0808   CALCIUM 9.3 02/27/2013 0808   GFRNONAA 48* 08/26/2012 0224   GFRAA 56* 08/26/2012 0224   Lab Results  Component Value Date   INR 1.02 04/20/2011   No results found for this basename: PTT    SOCIAL HISTORY: History   Social History  . Marital Status:  Married    Spouse Name: N/A    Number of Children: N/A  . Years of Education: N/A   Occupational History  . Not on file.   Social History Main Topics  . Smoking status: Former Games developer  . Smokeless tobacco: Never Used  . Alcohol Use: No  . Drug Use: No  . Sexually Active: Yes   Other Topics Concern  . Not on file   Social History Narrative  . No narrative on file    FAMILY HISTORY: No family history on file.   REVIEW OF SYSTEMS: Reviewed from Dr. Orvan July note and again with patient today.  DENTAL HISTORY: CHIEF COMPLAINT: Patient was referred by Dr. Tressie Stalker for a dental consultation.  HPI: Russell Martin is a 62 -year-old male recently diagnosed with mitral and tricuspid regurgitation. Patient with anticipated mitral and tricuspid valve replacement or repair with Dr. Cornelius Moras. The patient is now seen as part of a medically necessary pre-heart valve surgery dental protocol to rule out dental infection that may affect the patient's systemic health and anticipated heart valve surgery.  The patient currently denies acute toothache, swellings, or abscesses. The patient was seen in March 2014 by a dentist with [facility] for evaluation of a toothache. The patient had pain of lower left molar that reached an intensity of 8/10 at the time. Patient indicates that the lower left molar now only hurts when " touches it". Patient experiences pain at approximately 3/10 in intensity. Patient indicates that the pain is more of a dull, achy pain.  Patient was prescribed antibiotic therapy at that time. Patient did not have definitive extraction at that time.  Prior to that, the patient has not been seen for approximately 10 years. This was with a dentist " [place] ". The patient did have upper and lower cast partial denture fabricated approximately 10 years ago. Patient was unable to wear the cast partial dentures delivered at the time of extraction of teeth.   DENTAL  EXAMINATION:  GENERAL: Patient is a well-developed, well-nourished male in no acute distress. HEAD AND NECK: There is no palpable lymphadenopathy. The patient denies acute TMJ symptoms. INTRAORAL EXAM: The patient has normal saliva. There is no evidence of abscess formation. DENTITION: The patient is missing tooth numbers 2, 3, 4, 5, 11, 12, 13, 14, 15, 18, 19, 30, 31, and 32. PERIODONTAL: Patient has chronic periodontitis with plaque and calculus accumulations, gingival recession, and moderate bone loss. There is tooth mobility noted as per dental charting form. DENTAL CARIES/SUBOPTIMAL RESTORATIONS: Dental caries are noted per dental charting form. ENDODONTIC: Patient currently denies acute pulpitis symptoms. Tooth #17 has  caused previous toothache symptoms. CROWN AND BRIDGE: There are no crown or bridge restorations. PROSTHODONTIC: The patient has a history of upper lower cast partial denture fabricated approximately 10 years ago. The patient no longer wears these partial dentures. OCCLUSION: The patient has a poor occlusal scheme secondary to multiple missing teeth, supra-eruption and drifting of the unopposed teeth into the edentulous areas, and lack of replacement of the missing teeth with clinically acceptable dental prostheses.  The patient has maxillary anterior splaying of his teeth.  RADIOGRAPHIC INTERPRETATION: An orthopantogram was obtained and supplemented with 5 lower periapical radiographs. There are multiple missing teeth. There is supra-eruption and drifting of the unopposed teeth into the edentulous areas. There multiple diastemas noted. Dental caries are noted. There is moderate to severe bone loss. Radiographic calculus is noted.  ASSESSMENTS: 1. Chronic periodontitis with bone loss 2. Gingival recession 3. Plaque and calculus accumulations 4. Tooth mobility 5. Multiple missing teeth 6. Supra-eruption and drifting of the unopposed teeth into the edentulous areas 7. Dental  caries 8. Multiple diastemas 9. Poor occlusal scheme and malocclusion 10. History of acute pulpitis symptoms 11. History of ill fitting partial dentures  PLAN/RECOMMENDATIONS: 1. I discussed the risks, benefits, and complications of various treatment options with the patient in relationship to his medical and dental conditions, anticipated heart valve surgery, and risk for endocarditis. We discussed various treatment options to include no treatment, multiple extractions with alveoloplasty, pre-prosthetic surgery as indicated, periodontal therapy, dental restorations, root canal therapy, crown and bridge therapy, implant therapy, and replacement of missing teeth as indicated. The patient currently wishes to proceed with extraction remaining teeth with alveoloplasty and pre-prosthetic surgery as indicated in the operating room with general anesthesia. This procedure has been scheduled for 03/20/2013 at 9 AM at [facility].  After adequate healing, the patient will proceed with heart valve surgery at the discretion of Dr. Cornelius Moras. Patient will then followup with dentist of his choice for fabrication of upper lower complete dentures after adequate healing and once medically stable from the anticipated heart valve surgery.   2. Discussion of findings with medical team and coordination of future medical and dental care as indicated.  [name], DDS

## 2013-03-17 NOTE — Patient Instructions (Addendum)
Patient is scheduled for extraction of remaining teeth with alveoloplasty in the operating room with general anesthesia on 03/20/2013 at 9 AM at [facility]. Patient will then follow up the dentist of his choice for fabrication of upper lower complete dentures after adequate healing and once medically stable from the anticipated heart valve surgery. Dr. Kristin Bruins

## 2013-03-19 ENCOUNTER — Encounter (HOSPITAL_COMMUNITY): Payer: Self-pay

## 2013-03-19 ENCOUNTER — Encounter (HOSPITAL_COMMUNITY)
Admission: RE | Admit: 2013-03-19 | Discharge: 2013-03-19 | Disposition: A | Discharge status: Home / Self Care | Payer: Federal, State, Local not specified - PPO | Source: Ambulatory Visit | Attending: Dentistry | Admitting: Dentistry

## 2013-03-19 HISTORY — DX: Presence of cardiac pacemaker: Z95.0

## 2013-03-19 HISTORY — DX: Nonrheumatic mitral (valve) insufficiency: I34.0

## 2013-03-19 LAB — BASIC METABOLIC PANEL
BUN: 16 mg/dL (ref 6–23)
Calcium: 9.9 mg/dL (ref 8.4–10.5)
Creatinine, Ser: 1.32 mg/dL (ref 0.50–1.35)
GFR calc Af Amer: 66 mL/min — ABNORMAL LOW (ref >90)
GFR calc non Af Amer: 57 mL/min — ABNORMAL LOW (ref >90)
Potassium: 4.1 mEq/L (ref 3.5–5.1)

## 2013-03-19 LAB — CBC
HCT: 42.8 % (ref 39.0–52.0)
MCH: 27.6 pg (ref 26.0–34.0)
MCHC: 33.4 g/dL (ref 30.0–36.0)
MCV: 82.6 fL (ref 78.0–100.0)
RDW: 15.2 % (ref 11.5–15.5)

## 2013-03-19 MED ORDER — CEFAZOLIN SODIUM-DEXTROSE 2-3 GM-% IV SOLR
2.0000 g | Freq: Once | INTRAVENOUS | Status: AC
  Administered 2013-03-20: 2 g via INTRAVENOUS
  Filled 2013-03-19: qty 50

## 2013-03-19 MED ORDER — CEFAZOLIN SODIUM-DEXTROSE 2-3 GM-% IV SOLR: 2.0000 g | Freq: Once | INTRAVENOUS | Status: DC

## 2013-03-19 NOTE — Pre-Procedure Instructions (Signed)
Russell Martin  03/19/2013   Your procedure is scheduled on:  03/20/13  Report to [facility] at 7 AM.  Call this number if you have problems the morning of surgery: [phone]   Remember:   Do not eat food or drink liquids after midnight.   Take these medicines the morning of surgery with A SIP OF WATER: allopurinol,carvedilol,digox   Do not wear jewelry, make-up or nail polish.  Do not wear lotions, powders, or perfumes. You may wear deodorant.  Do not shave 48 hours prior to surgery. Men may shave face and neck.  Do not bring valuables to the hospital.  [facility] is not responsible                   for any belongings or valuables.  Contacts, dentures or bridgework may not be worn into surgery.  Leave suitcase in the car. After surgery it may be brought to your room.  For patients admitted to the hospital, checkout time is 11:00 AM the day of  discharge.   Patients discharged the day of surgery will not be allowed to drive  home.  Name and phone number of your driver: family  Special Instructions: Shower using CHG 2 nights before surgery and the night before surgery.  If you shower the day of surgery use CHG.  Use special wash - you have one bottle of CHG for all showers.  You should use approximately 1/3 of the bottle for each shower.   Please read over the following fact sheets that you were given: Pain Booklet, Coughing and Deep Breathing and Surgical Site Infection Prevention

## 2013-03-19 NOTE — Progress Notes (Signed)
Anesthesia chart review: Patient is a 61 year old male scheduled for multiple teeth extractions with alveoloplasty on 03/20/13 by Dr. Kristin Bruins.  He has a history of dilated nonischemic cardiomyopathy with chronic combined systolic and diastolic congestive heart failure with stabilization of CHF symptoms following upgrade of his dual-chamber pacemaker to a biventricular pacer on 11/14/12. Recent echocardiogram demonstrated severe mitral and tricuspid regurgitation, and he was referred to CT surgeon Dr. Cornelius Moras who feels patient should be considered for elective mitral and tricuspid valve repair or replacement. Other history includes former smoker, hyperlipidemia, hypertension, CHB s/p [employer] PPM. PCP is Dr. Cain Saupe. Cardiologist is Dr. Gala Romney, EP Dr. Ladona Ridgel.  (He was previously followed by Dr. Alanda Amass.)  2D echo on 01/27/13 showed:  - Left ventricle: The cavity size was moderately dilated. There was moderate concentric hypertrophy. Systolic function was mildly to moderately reduced. The estimated ejection fraction was in the range of 40% to 45%. Moderate hypokinesis of the anteroseptal myocardium. - Aortic valve: Mild regurgitation. - Mitral valve: There was malcoaptation of the valve leaflets. Severe regurgitation directed eccentrically and posteriorly. - Left atrium: The appendage was moderately to severely dilated. - Right ventricle: The cavity size was mildly dilated. Systolic pressure was increased. - Right atrium: The appendage was mildly dilated. - Atrial septum: No defect or patent foramen ovale was identified. - Tricuspid valve: Moderate regurgitation. - Pulmonary arteries: PA peak pressure: 58mm Hg (S). Impressions: EF improved s/p Bi-V pacing, Severe MR persists. NSR with Bi-V paced rhythm. The right ventricular systolic pressure was increased consistent with moderate pulmonary hypertension.  Cardiac cath on 09/19/12 showed normal coronaries, LV globally reduced with EF 30-35%.  EKG on  01/12/13 shows a slow (40 bpm) idioventricular rhythm (Wenckebach pattern?).  His underlying rhythm was documented as complete heart block on 02/17/13.  This EKG was already noted by cardiology although no note was available regarding the circumstances of this EKG.  He has since been re-evaluated by Dr. Ladona Ridgel and Dr. Gala Romney with normal PPM function documented on 02/17/13. Will order a repeat EKG on arrival for a more recent baseline.  Preoperative labs and previous CXR report noted.  Patient has had recent cardiology and CT surgery evaluation.  He is in need of future MVR/TVR, but has chronic periodontitis with multiple caries and is in need of teeth extraction first.  He will be evaluated by his assigned anesthesiologist on the day of surgery, but I would anticipate that he could proceed as planned.  Velna Ochs [facility]/Anesthesiology Phone [phone] 03/19/2013 4:50 PM

## 2013-03-19 NOTE — Progress Notes (Signed)
Pacemaker form faxed to dr Sharrell Ku,  [employer].

## 2013-03-20 ENCOUNTER — Encounter (HOSPITAL_COMMUNITY): Payer: Self-pay | Admitting: Vascular Surgery

## 2013-03-20 ENCOUNTER — Ambulatory Visit (HOSPITAL_COMMUNITY)
Admission: RE | Admit: 2013-03-20 | Discharge: 2013-03-20 | Disposition: A | Discharge status: Home / Self Care | Payer: Federal, State, Local not specified - PPO | Source: Ambulatory Visit | Attending: Dentistry | Admitting: Dentistry

## 2013-03-20 ENCOUNTER — Encounter (HOSPITAL_COMMUNITY)
Admission: RE | Disposition: A | Discharge status: Home / Self Care | Payer: Self-pay | Source: Ambulatory Visit | Attending: Dentistry

## 2013-03-20 ENCOUNTER — Encounter (HOSPITAL_COMMUNITY): Payer: Self-pay | Admitting: *Deleted

## 2013-03-20 ENCOUNTER — Ambulatory Visit (HOSPITAL_COMMUNITY): Payer: Federal, State, Local not specified - PPO | Admitting: Anesthesiology

## 2013-03-20 DIAGNOSIS — K0530 Chronic periodontitis, unspecified: Secondary | ICD-10-CM | POA: Insufficient documentation

## 2013-03-20 DIAGNOSIS — Z79899 Other long term (current) drug therapy: Secondary | ICD-10-CM | POA: Insufficient documentation

## 2013-03-20 DIAGNOSIS — I10 Essential (primary) hypertension: Secondary | ICD-10-CM | POA: Insufficient documentation

## 2013-03-20 DIAGNOSIS — K0889 Other specified disorders of teeth and supporting structures: Secondary | ICD-10-CM | POA: Diagnosis present

## 2013-03-20 DIAGNOSIS — Z7982 Long term (current) use of aspirin: Secondary | ICD-10-CM | POA: Insufficient documentation

## 2013-03-20 DIAGNOSIS — I509 Heart failure, unspecified: Secondary | ICD-10-CM | POA: Insufficient documentation

## 2013-03-20 DIAGNOSIS — Z950 Presence of cardiac pacemaker: Secondary | ICD-10-CM | POA: Insufficient documentation

## 2013-03-20 DIAGNOSIS — M264 Malocclusion, unspecified: Secondary | ICD-10-CM

## 2013-03-20 DIAGNOSIS — I428 Other cardiomyopathies: Secondary | ICD-10-CM | POA: Insufficient documentation

## 2013-03-20 DIAGNOSIS — I340 Nonrheumatic mitral (valve) insufficiency: Secondary | ICD-10-CM

## 2013-03-20 DIAGNOSIS — Z87891 Personal history of nicotine dependence: Secondary | ICD-10-CM | POA: Insufficient documentation

## 2013-03-20 DIAGNOSIS — I071 Rheumatic tricuspid insufficiency: Secondary | ICD-10-CM

## 2013-03-20 DIAGNOSIS — K009 Disorder of tooth development, unspecified: Secondary | ICD-10-CM

## 2013-03-20 DIAGNOSIS — I5042 Chronic combined systolic (congestive) and diastolic (congestive) heart failure: Secondary | ICD-10-CM | POA: Insufficient documentation

## 2013-03-20 DIAGNOSIS — I442 Atrioventricular block, complete: Secondary | ICD-10-CM | POA: Insufficient documentation

## 2013-03-20 DIAGNOSIS — I079 Rheumatic tricuspid valve disease, unspecified: Secondary | ICD-10-CM | POA: Insufficient documentation

## 2013-03-20 DIAGNOSIS — I059 Rheumatic mitral valve disease, unspecified: Secondary | ICD-10-CM | POA: Insufficient documentation

## 2013-03-20 DIAGNOSIS — E785 Hyperlipidemia, unspecified: Secondary | ICD-10-CM | POA: Insufficient documentation

## 2013-03-20 HISTORY — PX: MULTIPLE EXTRACTIONS WITH ALVEOLOPLASTY: SHX5342

## 2013-03-20 SURGERY — MULTIPLE EXTRACTION WITH ALVEOLOPLASTY
Anesthesia: General | Site: Mouth | Wound class: Clean Contaminated

## 2013-03-20 MED ORDER — ONDANSETRON HCL 4 MG/2ML IJ SOLN
INTRAMUSCULAR | Status: DC | PRN
  Administered 2013-03-20: 4 mg via INTRAVENOUS

## 2013-03-20 MED ORDER — OXYMETAZOLINE HCL 0.05 % NA SOLN
NASAL | Status: DC | PRN
  Administered 2013-03-20 (×2): 1 via NASAL

## 2013-03-20 MED ORDER — MIDAZOLAM HCL 5 MG/5ML IJ SOLN
INTRAMUSCULAR | Status: DC | PRN
  Administered 2013-03-20: 2 mg via INTRAVENOUS

## 2013-03-20 MED ORDER — OXYCODONE-ACETAMINOPHEN 5-325 MG PO TABS: ORAL_TABLET | ORAL | Status: DC

## 2013-03-20 MED ORDER — OXYMETAZOLINE HCL 0.05 % NA SOLN
NASAL | Status: AC
  Filled 2013-03-20: qty 15

## 2013-03-20 MED ORDER — STERILE WATER FOR IRRIGATION IR SOLN
Status: DC | PRN
  Administered 2013-03-20: 1

## 2013-03-20 MED ORDER — BUPIVACAINE-EPINEPHRINE PF 0.5-1:200000 % IJ SOLN
INTRAMUSCULAR | Status: AC
  Filled 2013-03-20: qty 10.8

## 2013-03-20 MED ORDER — PROPOFOL 10 MG/ML IV BOLUS
INTRAVENOUS | Status: DC | PRN
  Administered 2013-03-20: 200 mg via INTRAVENOUS

## 2013-03-20 MED ORDER — NEOSTIGMINE METHYLSULFATE 1 MG/ML IJ SOLN
INTRAMUSCULAR | Status: DC | PRN
  Administered 2013-03-20: 3 mg via INTRAVENOUS

## 2013-03-20 MED ORDER — FENTANYL CITRATE 0.05 MG/ML IJ SOLN
INTRAMUSCULAR | Status: DC | PRN
  Administered 2013-03-20: 150 ug via INTRAVENOUS

## 2013-03-20 MED ORDER — LIDOCAINE-EPINEPHRINE 2 %-1:100000 IJ SOLN
INTRAMUSCULAR | Status: DC | PRN
  Administered 2013-03-20: 10.2 mL

## 2013-03-20 MED ORDER — LIDOCAINE HCL (CARDIAC) 20 MG/ML IV SOLN
INTRAVENOUS | Status: DC | PRN
  Administered 2013-03-20: 50 mg via INTRAVENOUS

## 2013-03-20 MED ORDER — SODIUM CHLORIDE 0.9 % IR SOLN
Status: DC | PRN
  Administered 2013-03-20: 1

## 2013-03-20 MED ORDER — HYDROMORPHONE HCL PF 1 MG/ML IJ SOLN: 0.2500 mg | INTRAMUSCULAR | Status: DC | PRN

## 2013-03-20 MED ORDER — GLYCOPYRROLATE 0.2 MG/ML IJ SOLN
INTRAMUSCULAR | Status: DC | PRN
  Administered 2013-03-20: 0.4 mg via INTRAVENOUS

## 2013-03-20 MED ORDER — ROCURONIUM BROMIDE 100 MG/10ML IV SOLN
INTRAVENOUS | Status: DC | PRN
  Administered 2013-03-20: 40 mg via INTRAVENOUS

## 2013-03-20 MED ORDER — LIDOCAINE-EPINEPHRINE 2 %-1:100000 IJ SOLN
INTRAMUSCULAR | Status: AC
  Filled 2013-03-20: qty 17

## 2013-03-20 MED ORDER — LACTATED RINGERS IV SOLN
INTRAVENOUS | Status: DC
  Administered 2013-03-20 (×2): via INTRAVENOUS

## 2013-03-20 MED ORDER — OXYMETAZOLINE HCL 0.05 % NA SOLN
NASAL | Status: DC | PRN
  Administered 2013-03-20: 1 via NASAL

## 2013-03-20 MED ORDER — BUPIVACAINE-EPINEPHRINE 0.5% -1:200000 IJ SOLN
INTRAMUSCULAR | Status: DC | PRN
  Administered 2013-03-20: 3.6 mL

## 2013-03-20 SURGICAL SUPPLY — 39 items
ALCOHOL 70% 16 OZ (MISCELLANEOUS) ×2 IMPLANT
ATTRACTOMAT 16X20 MAGNETIC DRP (DRAPES) ×2 IMPLANT
BLADE SURG 15 STRL LF DISP TIS (BLADE) ×2 IMPLANT
BLADE SURG 15 STRL SS (BLADE) ×2
CLOTH BEACON ORANGE TIMEOUT ST (SAFETY) ×2 IMPLANT
COVER SURGICAL LIGHT HANDLE (MISCELLANEOUS) ×2 IMPLANT
CRADLE DONUT ADULT HEAD (MISCELLANEOUS) IMPLANT
DRAPE ORTHO SPLIT 77X108 STRL (DRAPES) ×1
DRAPE SURG ORHT 6 SPLT 77X108 (DRAPES) ×1 IMPLANT
GAUZE PACKING FOLDED 2  STR (GAUZE/BANDAGES/DRESSINGS) ×1
GAUZE PACKING FOLDED 2 STR (GAUZE/BANDAGES/DRESSINGS) ×1 IMPLANT
GAUZE SPONGE 4X4 16PLY XRAY LF (GAUZE/BANDAGES/DRESSINGS) ×4 IMPLANT
GLOVE SURG ORTHO 8.0 STRL STRW (GLOVE) ×2 IMPLANT
GLOVE SURG SS PI 6.5 STRL IVOR (GLOVE) ×4 IMPLANT
GOWN STRL REIN 3XL LVL4 (GOWN DISPOSABLE) ×2 IMPLANT
HEMOSTAT SURGICEL .5X2 ABSORB (HEMOSTASIS) IMPLANT
KIT BASIN OR (CUSTOM PROCEDURE TRAY) ×2 IMPLANT
KIT ROOM TURNOVER OR (KITS) ×2 IMPLANT
MANIFOLD NEPTUNE WASTE (CANNULA) ×2 IMPLANT
NEEDLE BLUNT 16X1.5 OR ONLY (NEEDLE) ×2 IMPLANT
NEEDLE DENTAL 27 LONG (NEEDLE) ×4 IMPLANT
NS IRRIG 1000ML POUR BTL (IV SOLUTION) ×2 IMPLANT
PACK EENT II TURBAN DRAPE (CUSTOM PROCEDURE TRAY) ×2 IMPLANT
PAD ARMBOARD 7.5X6 YLW CONV (MISCELLANEOUS) ×4 IMPLANT
SPONGE GAUZE 4X4 12PLY (GAUZE/BANDAGES/DRESSINGS) ×2 IMPLANT
SPONGE SURGIFOAM ABS GEL 100 (HEMOSTASIS) IMPLANT
SPONGE SURGIFOAM ABS GEL 12-7 (HEMOSTASIS) IMPLANT
SPONGE SURGIFOAM ABS GEL SZ50 (HEMOSTASIS) IMPLANT
SUCTION FRAZIER TIP 10 FR DISP (SUCTIONS) ×2 IMPLANT
SUT CHROMIC 3 0 PS 2 (SUTURE) ×8 IMPLANT
SUT CHROMIC 4 0 P 3 18 (SUTURE) IMPLANT
SYR 50ML SLIP (SYRINGE) ×2 IMPLANT
TAPE SURG TRANSPORE 1 IN (GAUZE/BANDAGES/DRESSINGS) ×1 IMPLANT
TAPE SURGICAL TRANSPORE 1 IN (GAUZE/BANDAGES/DRESSINGS) ×1
TOWEL OR 17X24 6PK STRL BLUE (TOWEL DISPOSABLE) ×2 IMPLANT
TOWEL OR 17X26 10 PK STRL BLUE (TOWEL DISPOSABLE) ×2 IMPLANT
TUBE CONNECTING 12X1/4 (SUCTIONS) ×2 IMPLANT
WATER STERILE IRR 1000ML POUR (IV SOLUTION) ×2 IMPLANT
YANKAUER SUCT BULB TIP NO VENT (SUCTIONS) ×2 IMPLANT

## 2013-03-20 NOTE — Transfer of Care (Signed)
Immediate Anesthesia Transfer of Care Note  Patient: Russell Martin  Procedure(s) Performed: Procedure(s): Extraction of tooth #'s 1,6,7,8,9,10,16,17,20,21,22,23,24,25,26,27,28, 29 with alveoloplasty (N/A)  Patient Location: PACU  Anesthesia Type:General  Level of Consciousness: sedated  Airway & Oxygen Therapy: Patient Spontanous Breathing and Patient connected to nasal cannula oxygen  Post-op Assessment: Report given to PACU RN, Post -op Vital signs reviewed and stable and Patient moving all extremities  Post vital signs: Reviewed and stable  Complications: No apparent anesthesia complications

## 2013-03-20 NOTE — H&P (Signed)
03/20/2013  Patient:            Russell Martin Date of Birth:  1951-07-25 MRN:                [id number]  AMERE BRICCO is a 62 year old male with mitral valve and tricuspid valve regurgitation. Patient was seen for a medically necessary pre-heart valve surgery dental protocol examination. Patient is now scheduled for multiple extractions with alveoloplasty and pre-prosthetic surgery as indicated in the operative room today. Patient denies having any acute medical or dental changes. Please see per Lemont Fillers note of 03/11/2013 below to act as history and physical for dental OR procedures.  [name], DDS  Progress Notes             [address]       [name] [address]             [phone]                   CARDIOTHORACIC SURGERY CONSULTATION REPORT   Referring Provider is Bensimhon, Bevelyn Buckles, MD PCP is Cain Saupe, MD    Chief Complaint   Patient presents with   .  Mitral Regurgitation       and Tricuspid Vave Regurg....eval and treat...ECHO 01/27/13.....last heart CATH 09/19/12   .  Congestive Heart Failure        HPI:   Patient is a 62 year old African American male with long-standing history of dilated nonischemic cardiomyopathy and chronic combined systolic and diastolic congestive heart failure. The patient states that he first presented in 2001 with complete heart block and underwent placement of a permanent pacemaker. Until recently he had been followed by Dr. Alanda Amass. In 2012 he began to develop progressive symptoms of congestive heart failure.  Symptoms gradually progressed until this past December when he want up in the emergency department with class IV congestive heart failure pulmonary edema. He responded quickly to diuretic therapy. He ultimately was referred to Dr. Ladona Ridgel for biventricular pacemaker upgrade which was performed in March of this year.  With biventricular pacing the patient's symptoms of congestive heart  failure has stabilized nicely, but followup echocardiogram performed in may revealed the presence of severe mitral regurgitation and severe tricuspid regurgitation.  The patient recently was referred to the congestive heart failure clinic where he was evaluated by Dr. Gala Romney on 03/03/2013.  Based upon review of the patient's most recent echocardiogram, Dr. Gala Romney felt that the patient might benefit from elective surgical intervention for mitral and tricuspid regurgitation. The patient has been referred for surgical consultation.   At present the patient reports stable symptoms of exertional shortness of breath which typically only occur with moderate physical activity, functional class II. He denies resting shortness of breath, PND, orthopnea, or lower extremity edema. He states that these symptoms have all remained reasonably stable and improved since he began biventricular pacing last March. He has never had any chest pain or chest tightness. He has not had any tachypalpitations or dizzy spells.    Past Medical History   Diagnosis  Date   .  CHF (congestive heart failure)         a. NICM EF 35% - no CAD by cath 09/2012. b. s/p upgrade to Bi-V pacemaker 11/2012 (did not get ICD as Dr. Ladona Ridgel expects his EF will improve with bi-V pacing).   .  Hyperlipidemia     .  Hypertension  pt denies HTN   .  CHB (complete heart block)         a. Pacer initially placed ~2002.          Past Surgical History   Procedure  Laterality  Date   .  Pacemaker placement            No family history on file.    History       Social History   .  Marital Status:  Married       Spouse Name:  N/A       Number of Children:  N/A   .  Years of Education:  N/A       Occupational History   .  Not on file.       Social History Main Topics   .  Smoking status:  Former Games developer   .  Smokeless tobacco:  Never Used   .  Alcohol Use:  No   .  Drug Use:  No   .  Sexually Active:  Yes       Other  Topics  Concern   .  Not on file       Social History Narrative   .  No narrative on file         Current Outpatient Prescriptions   Medication  Sig  Dispense  Refill   .  allopurinol (ZYLOPRIM) 100 MG tablet  Take 100 mg by mouth daily.         [name]  aspirin EC 81 MG tablet  Take 81 mg by mouth daily.         .  carvedilol (COREG) 12.5 MG tablet  Take 12.5 mg by mouth daily.         [name]  DIGOX 125 MCG tablet  daily         .  furosemide (LASIX) 20 MG tablet  Take 20 mg by mouth 2 (two) times daily.         [name]  losartan (COZAAR) 25 MG tablet  Take 1 tablet (25 mg total) by mouth daily.   90 tablet   3   .  Multiple Vitamin (MULTIVITAMIN WITH MINERALS) TABS  Take 1 tablet by mouth daily.         .  niacin 500 MG tablet  Take 1 tablet (500 mg total) by mouth daily with breakfast.   30 tablet   10   .  simvastatin (ZOCOR) 80 MG tablet  Take 80 mg by mouth at bedtime.             No current facility-administered medications for this visit.        No Known Allergies       Review of Systems:               General:                      normal appetite, normal energy, no weight gain, no weight loss, no fever             Cardiac:                      no chest pain with exertion, no chest pain at rest, + SOB with moderate exertion, no resting SOB, no PND, no orthopnea, no palpitations, no arrhythmia, no atrial fibrillation, no LE edema, no dizzy spells, no syncope  Respiratory:                + exertional shortness of breath, no home oxygen, no productive cough, occasional dry cough, no bronchitis, no wheezing, no hemoptysis, no asthma, no pain with inspiration or cough, no sleep apnea, no CPAP at night             GI:                                no difficulty swallowing, no reflux, no frequent heartburn, no hiatal hernia, no abdominal pain, no constipation, no diarrhea, no hematochezia, no hematemesis, no melena             GU:                              no dysuria,  +  frequency, no urinary tract infection, no hematuria, no enlarged prostate, no kidney stones, mild kidney disease             Vascular:                     no pain suggestive of claudication, + pain in feet, no leg cramps, no varicose veins, no DVT, no non-healing foot ulcer             Neuro:                         no stroke, no TIA's, no seizures, no headaches, no temporary blindness one eye,  no slurred speech, no peripheral neuropathy, no chronic pain, no instability of gait, no memory/cognitive dysfunction             Musculoskeletal:         no arthritis, no joint swelling, no myalgias, minor difficulty walking, normal mobility               Skin:                            no rash, no itching, no skin infections, no pressure sores or ulcerations             Psych:                         no anxiety, no depression, no nervousness, no unusual recent stress             Eyes:                           no blurry vision, no floaters, no recent vision changes, does not wears glasses or contacts             ENT:                            no hearing loss, + loose or painful teeth, no dentures, last saw dentist many years ago, teeth in poor condition             Hematologic:               no easy bruising, no abnormal bleeding, no clotting disorder, no frequent epistaxis  Endocrine:                   no diabetes, does not check CBG's at home                            Physical Exam:               BP 129/74  Pulse 65  Resp 16  Ht 5' 10.5" (1.791 m)  Wt 176 lb 8 oz (80.06 kg)  BMI 24.96 kg/m2  SpO2 98%             General:                        well-appearing             HEENT:                       Unremarkable               Neck:                           no JVD, no bruits, no adenopathy               Chest:                         clear to auscultation, symmetrical breath sounds, no wheezes, no rhonchi               CV:                              RRR, grade IV/VI holosystolic  murmur               Abdomen:                    soft, non-tender, no masses               Extremities:                 warm, well-perfused, pulses palpable in groin, no LE edema             Rectal/GU                   Deferred             Neuro:                         Grossly non-focal and symmetrical throughout             Skin:                            Clean and dry, no rashes, no breakdown     Diagnostic Tests:    Transthoracic Echocardiography  Patient: Jahaziel, Francois MR #: [id number] Study Date: 01/27/2013 Gender: M Age: 33 Height: 177.8cm Weight: 85.7kg BSA: 2.10m^2 Pt. Status: Room:  Cheron Schaumann, MD [facility] PERFORMING Susa Griffins, MD [facility] REFERRING Susa Griffins, MD [facility] ATTENDING Rennis Golden, Iantha Fallen REFERRING Martelle, Iantha Fallen SONOGRAPHER Clearence Ped, RCS cc:  ------------------------------------------------------------ LV EF: 40% - 45%  ------------------------------------------------------------ Indications: 428.0 CHF.  ------------------------------------------------------------ History: PMH: Dyspnea. Primary pulmonary hypertension.  ------------------------------------------------------------  Study Conclusions  - Left ventricle: The cavity size was moderately dilated. There was moderate concentric hypertrophy. Systolic function was mildly to moderately reduced. The estimated ejection fraction was in the range of 40% to 45%. Moderate hypokinesis of the anteroseptal myocardium. - Aortic valve: Mild regurgitation. - Mitral valve: There was malcoaptation of the valve leaflets. Severe regurgitation directed eccentrically and posteriorly. - Left atrium: The appendage was moderately to severely dilated. - Right ventricle: The cavity size was mildly dilated. Systolic pressure was increased. - Right atrium: The appendage was mildly dilated. - Atrial septum: No defect or patent foramen ovale was identified. - Tricuspid valve: Moderate  regurgitation. - Pulmonary arteries: PA peak pressure: 58mm Hg (S). Impressions:  - EF improved s/p Bi-V pacing, Severe MR persists. NSR with Bi-V paced rhythm. The right ventricular systolic pressure was increased consistent with moderate pulmonary hypertension.  ------------------------------------------------------------ Labs, prior tests, procedures, and surgery: Permanent pacemaker system implantation.  Transthoracic echocardiography. M-mode, complete 2D, spectral Doppler, and color Doppler. Height: Height: 177.8cm. Height: 70in. Weight: Weight: 85.7kg. Weight: 188.6lb. Body mass index: BMI: 27.1kg/m^2. Body surface area: BSA: 2.29m^2. Blood pressure: 110/70. Patient status: Outpatient. Location: Echo laboratory.  ------------------------------------------------------------  ------------------------------------------------------------ Left ventricle: The cavity size was moderately dilated. There was moderate concentric hypertrophy. Systolic function was mildly to moderately reduced. The estimated ejection fraction wasapproximately 45% . Regional wall motion abnormalities: Moderate hypokinesis of the anteroseptal myocardium. No significant intra-ventricular dyssynchrony,  ------------------------------------------------------------ Aortic valve: Trileaflet. Doppler: There was no stenosis. Mild regurgitation. VTI ratio of LVOT to aortic valve: 0.47. Peak velocity ratio of LVOT to aortic valve: 0.47. Mean gradient: 5mm Hg (S).  ------------------------------------------------------------ Aorta: The aorta was normal, not dilated, and non-diseased.  ------------------------------------------------------------ Mitral valve: No echocardiographic evidence for prolapse. There was malcoaptation of the valve leaflets. Doppler: Severe regurgitation with atrial reversal, directed eccentrically and posteriorly in modt views. Some views appeared central. Valve area by pressure  half-time: 2.97cm^2. Indexed valve area by pressure half-time: 1.46cm^2/m^2. Mean gradient: 4mm Hg (D). Peak gradient: 12mm Hg (D).  ------------------------------------------------------------ Left atrium: LA volume/ BSA = 38.7 ml/m2 The appendage was moderately to severely dilated.  ------------------------------------------------------------ Atrial septum: No defect or patent foramen ovale was identified.  ------------------------------------------------------------ Right ventricle: The cavity size was mildly dilated. Pacer wire or catheter noted in right ventricle. Systolic pressure was increased.  ------------------------------------------------------------ Pulmonic valve: The valve appears to be grossly normal. Doppler: Trivial regurgitation.  ------------------------------------------------------------ Tricuspid valve: Structurally normal valve. Leaflet separation was normal. Doppler: Transvalvular velocity was within the normal range. Moderate regurgitation.  ------------------------------------------------------------ Right atrium: Pacer wire or catheter noted in right atrium. The appendage was mildly dilated.  ------------------------------------------------------------ Pericardium: The pericardium was normal in appearance.  ------------------------------------------------------------ Systemic veins: Inferior vena cava: The vessel was dilated; the respirophasic diameter changes were blunted (< 50%); findings are consistent with elevated central venous pressure. Diameter: 22mm.  ------------------------------------------------------------ Post procedure conclusions Ascending Aorta:  - The aorta was normal, not dilated, and non-diseased.  ------------------------------------------------------------  2D measurements Normal Doppler measurements Normal IVC Main pulmonary Diam 22 mm ------ artery Left ventricle Pressure, 58 mm Hg =30 LVID ED, 63.2 mm 43-52  S chord, Left ventricle PLAX Ea, lat 9.1 cm/s ------ LVID ES, 47.5 mm 23-38 ann, tiss chord, DP PLAX E/Ea, lat 14.9 ------ FS, chord, 25 % >29 ann, tiss 5 PLAX DP LVPW, ED 13.1 mm ------ Ea, med 9.1 cm/s ------ IVS/LVPW 0.87 <1.3 ann, tiss ratio, ED DP Vol ED, 213 ml ------ E/Ea, med 14.9 ------ MOD1 ann, tiss 5  Vol ES, 100 ml ------ DP MOD1 LVOT EF, MOD1 53 % ------ Peak vel, 71.4 cm/s ------ Vol index, 104 ml/m^2 ------ S ED, MOD1 VTI, S 10.6 cm ------ Vol index, 49 ml/m^2 ------ Aortic valve ES, MOD1 Peak vel, 151 cm/s ------ Vol ED, 232 ml ------ S MOD2 Mean vel, 100 cm/s ------ Vol ES, 109 ml ------ S MOD2 VTI, S 22.5 cm ------ EF, MOD2 53 % ------ Mean 5 mm Hg ------ Stroke 123 ml ------ gradient, vol, MOD2 S Vol index, 114 ml/m^2 ------ VTI ratio 0.47 ------ ED, MOD2 LVOT/AV Vol index, 53 ml/m^2 ------ Peak vel 0.47 ------ ES, MOD2 ratio, Stroke 60.3 ml/m^2 ------ LVOT/AV index, Regurg PHT 351 ms ------ MOD2 Mitral valve Ventricular septum Peak E vel 136 cm/s ------ IVS, ED 11.4 mm ------ Peak A vel 50.8 cm/s ------ Aorta Mean vel, 85.5 cm/s ------ Root diam, 28 mm ------ D ED Decelerati 173 ms 150-23 Left atrium on time 0 AP dim 57 mm ------ Pressure 74 ms ------ AP dim 2.79 cm/m^2 <2.2 half-time index Mean 4 mm Hg ------ Right ventricle gradient, RVID ED, 37.6 mm 19-38 D PLAX Peak 12 mm Hg ------ gradient, D Peak E/A 2.7 ------ ratio Area (PHT) 2.97 cm^2 ------ Area index 1.46 cm^2/m ------ (PHT) ^2 Annulus 35.8 cm ------ VTI Regurg 30.8 cm/s ------ alias vel, PISA Max regurg 624 cm/s ------ vel Regurg VTI 181 cm ------ ERO, PISA 0.31 cm^2 ------ Regurg 56 ml ------ vol, PISA Tricuspid valve Regurg 345 cm/s ------ peak vel Peak RV-RA 48 mm Hg ------ gradient, S Systemic veins Estimated 10 mm Hg ------ CVP Right ventricle Pressure, 58 mm Hg <30 S Sa vel, 16.9 cm/s ------ lat ann, tiss  DP  ------------------------------------------------------------ Prepared and Electronically Authenticated by  Susa Griffins, MD [facility] 2014-05-20T09:[id number]                 CARDIAC CATHETERIZATION    History obtained from chart review and the patient.   PROCEDURE DESCRIPTION:   The patient was brought to the second floor   [place] [facility] cath lab in the postabsorptive state. He was   premedicated with . His right groin   was prepped and shaved in usual sterile fashion. Xylocaine 1% was used   for local anesthesia. A 5 French sheath was inserted into the   RFA and 7 [place] into the RFV. CO was determined by the Fick and Thermodilution methods. 5 [place] FL4, FR4, and pigtail catheters were utilized. Hemostasis was obtained by manual compression.   HEMODYNAMICS:   RA: 11   RV: 58-60/10   PA: 60/28; mean 44   PC: 17; v wave 25   AO: 116/68   LV: 116/12   CO: 5.4 (Thermo); 5.3 l/m   CI: 2.6 2.5 l/m/m2   ANGIOGRAPHIC RESULTS:   1. Left main: Nl   2. LAD: Nl   3. Left circumflex: Nl.   4. Right coronary artery: Nl   LV: globally reduced EF 30 -35%   IMPRESSION: Nonischemic Cardiomyopathy   Per Dr. Kandis Cocking recommendation, I have discussed with Dr.Taylor concerning evaluation for Bi-Ventricular upgrade to see if LV function improves, and may ultimately require ICD.   [name], MD, [facility]   09/19/2012   11:20 AM        Impression:   Patient has long-standing history of dilated nonischemic cardiomyopathy with chronic combined systolic and diastolic congestive heart failure. Congestive heart failure progressed to the point of class III to class  IV symptoms by the end of this past year, but have recently improved and stabilized since the patient underwent biventricular pacemaker upgrade to commence biventricular pacing. However, followup echocardiogram demonstrates the presence of severe mitral and tricuspid regurgitation related to pure annular dilatation  with type I mitral and tricuspid valve dysfunction.  I agree that it would be reasonable to consider elective mitral and tricuspid valve repair or replacement. The patient may be a good candidate for minimally invasive approach for surgery. The patient is noted to have very poor dentition.     Plan:   I reviewed the indications, risks, and potential benefits of mitral valve and tricuspid valve repair or replacement with the patient and his wife in the office today. Rationale for surgical intervention has been discussed. Surgical alternatives have been discussed.  I favor proceeding with transesophageal echocardiogram to further characterize the severity and functional anatomy of both mitral and tricuspid valve disease. We will refer the patient for dental consultation. I expect that he'll need dental extraction. We'll plan to see the patient back within 3-4 weeks after his TEE has been completed to discuss making plans for surgery in the near future.       Salvatore Decent. Cornelius Moras, MD 03/11/2013 5:08 PM

## 2013-03-20 NOTE — Anesthesia Procedure Notes (Signed)
Procedure Name: Intubation Date/Time: 03/20/2013 9:54 AM Performed by: Charm Barges, Eathen Budreau R Pre-anesthesia Checklist: Patient identified, Emergency Drugs available, Suction available, Patient being monitored and Timeout performed Patient Re-evaluated:Patient Re-evaluated prior to inductionOxygen Delivery Method: Circle system utilized Preoxygenation: Pre-oxygenation with 100% oxygen Intubation Type: IV induction Ventilation: Mask ventilation without difficulty Laryngoscope Size: Mac and 4 Nasal Tubes: Right, Nasal Rae and Nasal prep performed Tube size: 7.5 mm Number of attempts: 1 Intubation method: Red rubber catheter. Placement Confirmation: ETT inserted through vocal cords under direct vision,  positive ETCO2 and breath sounds checked- equal and bilateral Secured at: 29 cm Tube secured with: Tape Dental Injury: Teeth and Oropharynx as per pre-operative assessment

## 2013-03-20 NOTE — Op Note (Signed)
Patient:            Russell Martin Date of Birth:  [birth year] MRN:                [id number]   DATE OF PROCEDURE:  03/20/2013               OPERATIVE REPORT   PREOPERATIVE DIAGNOSES: 1. Mitral regurgitation 2. Tricuspid regurgitation 3. A pre-heart valve surgery dental protocol 4. Chronic periodontitis 5. Multiple mobile teeth 6. Malocclusion  POSTOPERATIVE DIAGNOSES: 1. Mitral regurgitation 2. Tricuspid regurgitation 3. A pre-heart valve surgery dental protocol 4. Chronic periodontitis 5. Multiple mobile teeth 6. Malocclusion  OPERATIONS: 1. Multiple extraction of tooth numbers 1, 6, 7, 8, 9, 10, 16, 17, 20, 21, 22, 23, 24, 25, 26, 27, 28, and 29 to 2. 4 Quadrants of alveoloplasty   SURGEON: [name], DDS  ASSISTANT:  1. Rory Percy, dental assistant 2. Doris Silverthorne, (Sales executive)  ANESTHESIA: General anesthesia via nasoendotracheal tube.  MEDICATIONS: 1. Ancef 2 g IV prior to invasive dental procedures. 2. Local anesthesia with a total utilization of 6 carpules each containing 34 mg of lidocaine with 0.017 mg of epinephrine as well as 2 carpules each containing 9 mg of bupivacaine with 0.009 mg of epinephrine.  SPECIMENS: There are 18 teeth that were discarded.  DRAINS: None  CULTURES: None  COMPLICATIONS: None   ESTIMATED BLOOD LOSS: 150 mLs.  INTRAVENOUS FLUIDS: 1200 mLs of Lactated ringers solution.  INDICATIONS: The patient was recently diagnosed with mitral and tricuspid regurgitation.  A dental consultation was then requested to rule out dental infection that may affect the patient's systemic health and anticipated heart valve surgery.  The patient was examined and treatment planned for extraction remaining teeth with alveoloplasty and pre-prosthetic surgery as indicated.  This treatment plan was formulated to decrease the risks and complications associated with dental infection from affecting the patient's systemic health and the  anticipated heart valve surgery and to prevent future endocarditis.  OPERATIVE FINDINGS: Patient was examined operating room number 8.  The teeth were identified for extraction. The patient was noted be affected by chronic periodontitis, multiple multiple teeth, and malocclusion,   DESCRIPTION OF PROCEDURE: Patient was brought to the main operating room number 8. Patient was then placed in the supine position on the operating table. General Anesthesia was then induced per the anesthesia team. The patient was then prepped and draped in the usual manner for dental medicine procedure. A timeout was performed. The patient was identified and procedures were verified. A throat pack was placed at this time. The oral cavity was then thoroughly examined with the findings noted above. The patient was then ready for dental medicine procedure as follows:  Local anesthesia was then administered sequentially with a total utilization of 6 carpules each containing 34 mg of lidocaine with 0.017 mg of epinephrine as well as 2 carpules  each containing 9 mg bupivacaine with 0.009 mg of epinephrine.  The Maxillary left and right quadrants first approached. Anesthesia was then delivered utilizing infiltration with lidocaine with epinephrine. A #15 blade incision was then made from the maxillary right tuberosity and extended to the distal of #13. A second 15 blade incision was made from the maxillary left tuberosity and extended to the distal #14.  The surgical flaps were then carefully reflected. Appropriate amounts of buccal and interseptal bone were then removed utilizing a surgical handpiece and bur and copious amounts of sterile water.  The teeth were then  subluxated with a series of straight elevators. Tooth number 1 was removed with a 53R forceps leaving the mesial root remaining. Further bone was then removed appropriately and the root was elevated out with a root tip.   Tooth numbers 6, 7, 8, 9, and 10 were then  removed with a 150 forceps without complications. Tooth #16 was then approached and removed with a 53L forceps without complications. Alveoloplasty was then performed utilizing a ronguers and bone file. The surgical sites were then irrigated with copious amounts of sterile saline x4. The tissues were approximated and trimmed appropriately. The maxillary left surgical site was then closed from the maxillary left tuberosity and extended the mesial #14 utilizing 3-0 chromic gut suture in a continuous interrupted suture technique x1. The surgical site was then closed from the distal of #13 and extended to the mesial #utilizing 3-0 chromic gut suture in a continuous interrupted suture technique x1. The maxillary right surgical site was then closed from the maxillary tuberosity and extended the mesial number 8 utilizing 3-0 chromic gut suture in a continuous interrupted suture technique x1. 2 individual interrupted sutures are then placed to further close the surgical site.  At this point time, the mandibular quadrants were approached. The patient was given bilateral inferior alveolar nerve blocks and long buccal nerve blocks utilizing the bupivacaine with epinephrine. Further infiltration was then achieved utilizing the lidocaine with epinephrine. A 15 blade incision was then made from the distal of number #17 and extended to the mesial of #18 .  A second 15 blade incision was made from the distal of #19 and extended to the distal of #31. The surgical flaps were then carefully reflected. Appropriate amounts of buccal and interseptal bone were then removed appropriately. Tooth number 17 was then removed with a 17 forceps without complications. Tooth numbers 20, 21, 22, 23, 24, 25, 26, 27, 28, and 29 were then removed with a 151 forceps without complication. Alveoloplasty was then performed utilizing a rongeurs and bone file. The tissues were approximated and trimmed appropriately. The surgical sites were then irrigated  with copious amounts of sterile saline x4. The mandibular left surgical site was then closed from the distal of 17 and extended the mesial #8 utilizing 3-0 chromic gut suture in a continuous interrupted suture technique x1.  A second surgical site was then closed from the distal of #19 and extended to the mesial numbers 24 utilizing 3-0 chromic gut suture in a continuous surface suture technique x1.  The mandibular right surgical site was then closed from the distal of #31 and extended to the mesial numbers 25 utilizing 3-0 chromic gut suture in a continuous interrupted suture technique x1.  At this point time, the entire mouth was irrigated with copious amounts of sterile saline. The patient was exam for complications, seeing none, the dental medicine procedure was deemed to be complete. The throat pack was removed at this time. A series of 4 x 4 gauze were placed in the mouth to aid hemostasis. The patient was then handed over to the anesthesia team for final disposition. After an appropriate amount of time, the patient was extubated and taken to the postanesthsia care unit with stable vital signs and a good condition. All counts were correct for the dental medicine procedure. The patient is to return to dental medicine and evaluated for healing and suture removal in approximately 7-10 days. Heart valve surgery can be scheduled by Dr. Cornelius Moras at his discretion.   [name], DDS.

## 2013-03-20 NOTE — OR Nursing (Signed)
Cindra Eves, DDS requested for sequential compression device stockings to be placed on the patient's bilateral lower legs preoperatively.   Oralia Manis, RN

## 2013-03-20 NOTE — Anesthesia Preprocedure Evaluation (Addendum)
Anesthesia Evaluation  Patient identified by MRN, date of birth, ID band Patient awake    Reviewed: Allergy & Precautions, H&P , NPO status , Patient's Chart, lab work & pertinent test results, reviewed documented beta blocker date and time   Airway Mallampati: II TM Distance: >3 FB Neck ROM: Full    Dental no notable dental hx. (+) Poor Dentition and Dental Advisory Given   Pulmonary neg pulmonary ROS,  breath sounds clear to auscultation  Pulmonary exam normal       Cardiovascular hypertension, On Medications and On Home Beta Blockers +CHF + pacemaker + Valvular Problems/Murmurs MR Rhythm:Regular Rate:Normal + Systolic murmurs    Neuro/Psych negative neurological ROS  negative psych ROS   GI/Hepatic negative GI ROS, Neg liver ROS,   Endo/Other  negative endocrine ROS  Renal/GU negative Renal ROS  negative genitourinary   Musculoskeletal   Abdominal   Peds  Hematology negative hematology ROS (+)   Anesthesia Other Findings   Reproductive/Obstetrics negative OB ROS                          Anesthesia Physical Anesthesia Plan  ASA: III  Anesthesia Plan: General   Post-op Pain Management:    Induction: Intravenous  Airway Management Planned: Nasal ETT  Additional Equipment:   Intra-op Plan:   Post-operative Plan: Extubation in OR  Informed Consent: I have reviewed the patients History and Physical, chart, labs and discussed the procedure including the risks, benefits and alternatives for the proposed anesthesia with the patient or authorized representative who has indicated his/her understanding and acceptance.   Dental advisory given  Plan Discussed with: CRNA and Surgeon  Anesthesia Plan Comments:         Anesthesia Quick Evaluation

## 2013-03-20 NOTE — Progress Notes (Signed)
PRE-OPERATIVE NOTE:  03/20/2013 Russell Martin [id number]  VITALS: BP 145/85  Pulse 73  Temp(Src) 98.2 F (36.8 C) (Oral)  Resp 18  SpO2 100%  Lab Results  Component Value Date   WBC 5.5 03/19/2013   HGB 14.3 03/19/2013   HCT 42.8 03/19/2013   MCV 82.6 03/19/2013   PLT 168 03/19/2013   BMET    Component Value Date/Time   NA 140 03/19/2013 0915   K 4.1 03/19/2013 0915   CL 103 03/19/2013 0915   CO2 28 03/19/2013 0915   GLUCOSE 98 03/19/2013 0915   BUN 16 03/19/2013 0915   CREATININE 1.32 03/19/2013 0915   CALCIUM 9.9 03/19/2013 0915   GFRNONAA 57* 03/19/2013 0915   GFRAA 66* 03/19/2013 0915    Lab Results  Component Value Date   INR 1.02 04/20/2011   No results found for this basename: PTT     Russell Martin presents for  extraction of remaining teeth with alveoloplasty and pre-prosthetic surgery as indicated in the operating room general anesthesia. Patient understands the risks, benefits, and complications of the anticipated procedures performed with general anesthesia. This includes cardiovascular complications up to and including death as well as dental complications to include bleeding, bruising, swelling, nerve damage, sinus perforation, root tip fracture, mandible fracture, or other possible complications not specified.   SUBJECTIVE: The patient denies any acute medical or dental changes and agrees to proceed with treatment as planned.  EXAM: No sign of acute dental changes.  ASSESSMENT: Patient is affected by chronic periodontitis, tooth mobility, malocclusion, and history of ill fitting partial dentures.   PLAN: Patient agrees to proceed with treatment as planned in the operating room as previously discussed and accepts the risks, benefits, complications of the proposed treatment.  [name], DDS

## 2013-03-20 NOTE — Preoperative (Signed)
Beta Blockers   Reason not to administer Beta Blockers:Carvedilol taken at 0600 hrs on 03/20/13

## 2013-03-20 NOTE — Anesthesia Postprocedure Evaluation (Signed)
  Anesthesia Post-op Note  Patient: Russell Martin  Procedure(s) Performed: Procedure(s): Extraction of tooth #'s 1,6,7,8,9,10,16,17,20,21,22,23,24,25,26,27,28, 29 with alveoloplasty (N/A)  Patient Location: PACU  Anesthesia Type:General  Level of Consciousness: awake, oriented, sedated and patient cooperative  Airway and Oxygen Therapy: Patient Spontanous Breathing  Post-op Pain: none  Post-op Assessment: Post-op Vital signs reviewed, Patient's Cardiovascular Status Stable, Respiratory Function Stable, Patent Airway, No signs of Nausea or vomiting and Pain level controlled  Post-op Vital Signs: stable  Complications: No apparent anesthesia complications

## 2013-03-20 NOTE — Progress Notes (Signed)
[employer] rep, Kerry Fort called and informed of Dr. Olga Millers order saying that pacemaker needs to be asynchronous during surgery and then interrogated afterwards.  He will send someone to short stay to interrogate before surgery and see what is needed.

## 2013-03-21 ENCOUNTER — Emergency Department (HOSPITAL_COMMUNITY)
Admission: EM | Admit: 2013-03-21 | Discharge: 2013-03-21 | Disposition: A | Discharge status: Home / Self Care | Payer: Federal, State, Local not specified - PPO | Attending: Emergency Medicine | Admitting: Emergency Medicine

## 2013-03-21 ENCOUNTER — Encounter (HOSPITAL_COMMUNITY): Payer: Self-pay | Admitting: *Deleted

## 2013-03-21 DIAGNOSIS — K1370 Unspecified lesions of oral mucosa: Secondary | ICD-10-CM | POA: Insufficient documentation

## 2013-03-21 DIAGNOSIS — I10 Essential (primary) hypertension: Secondary | ICD-10-CM | POA: Insufficient documentation

## 2013-03-21 DIAGNOSIS — Z7982 Long term (current) use of aspirin: Secondary | ICD-10-CM | POA: Insufficient documentation

## 2013-03-21 DIAGNOSIS — R58 Hemorrhage, not elsewhere classified: Secondary | ICD-10-CM

## 2013-03-21 DIAGNOSIS — Z79899 Other long term (current) drug therapy: Secondary | ICD-10-CM | POA: Insufficient documentation

## 2013-03-21 DIAGNOSIS — Z87891 Personal history of nicotine dependence: Secondary | ICD-10-CM | POA: Insufficient documentation

## 2013-03-21 DIAGNOSIS — I509 Heart failure, unspecified: Secondary | ICD-10-CM | POA: Insufficient documentation

## 2013-03-21 DIAGNOSIS — E785 Hyperlipidemia, unspecified: Secondary | ICD-10-CM | POA: Insufficient documentation

## 2013-03-21 DIAGNOSIS — Z950 Presence of cardiac pacemaker: Secondary | ICD-10-CM | POA: Insufficient documentation

## 2013-03-21 DIAGNOSIS — Z9889 Other specified postprocedural states: Secondary | ICD-10-CM | POA: Insufficient documentation

## 2013-03-21 LAB — COMPREHENSIVE METABOLIC PANEL
ALT: 13 U/L (ref 0–53)
AST: 28 U/L (ref 0–37)
Albumin: 3.2 g/dL — ABNORMAL LOW (ref 3.5–5.2)
CO2: 29 mEq/L (ref 19–32)
Calcium: 9.5 mg/dL (ref 8.4–10.5)
GFR calc non Af Amer: 60 mL/min — ABNORMAL LOW (ref >90)
Sodium: 134 mEq/L — ABNORMAL LOW (ref 135–145)

## 2013-03-21 LAB — CBC WITH DIFFERENTIAL/PLATELET
Basophils Absolute: 0 10*3/uL (ref 0.0–0.1)
Eosinophils Relative: 1 % (ref 0–5)
Lymphocytes Relative: 24 % (ref 12–46)
MCV: 82.9 fL (ref 78.0–100.0)
Neutro Abs: 4.2 10*3/uL (ref 1.7–7.7)
Platelets: 142 10*3/uL — ABNORMAL LOW (ref 150–400)
RDW: 15.3 % (ref 11.5–15.5)
WBC: 7.4 10*3/uL (ref 4.0–10.5)

## 2013-03-21 MED ORDER — SODIUM CHLORIDE 0.9 % IV SOLN
INTRAVENOUS | Status: DC
  Administered 2013-03-21: 09:00:00 via INTRAVENOUS

## 2013-03-21 NOTE — ED Notes (Signed)
Family at bedside. 

## 2013-03-21 NOTE — ED Notes (Signed)
Quick clot applied to upper & lower left molar areas then rolled gauze placed in mouth. Instructed tio bite down to apply pressure.

## 2013-03-21 NOTE — ED Provider Notes (Signed)
History    CSN: [id number] Arrival date & time 03/21/13  7829  First MD Initiated Contact with Patient 03/21/13 0831     Chief Complaint  Patient presents with  . Coagulation Disorder   (Consider location/radiation/quality/duration/timing/severity/associated sxs/prior Treatment) HPI Comments: Russell Martin is a 62 y.o. Male Who states that his mouth began bleeding, during the night. He tried pressure, but it did not help. He was discharged, from the hospital yesterday after having extensive, dental extraction, in preparation for mitral valve surgery. He took an ibuprofen 800 mg yesterday, and an 81 mg aspirin this morning. He denies weakness, dizziness, nausea, vomiting, chest pain, shortness of breath, or seizures or headaches. He did not eat this morning. There are no other known modifying factors.  The history is provided by the patient and the spouse.   Past Medical History  Diagnosis Date  . CHF (congestive heart failure)     a. NICM EF 35% - no CAD by cath 09/2012. b. s/p upgrade to Bi-V pacemaker 11/2012 (did not get ICD as Dr. Ladona Ridgel expects his EF will improve with bi-V pacing).  . Hyperlipidemia   . Hypertension     pt denies HTN  . CHB (complete heart block)     a. Pacer initially placed ~2002.   . Mitral valve regurgitation   . Pacemaker     [employer] jude    greg taylor   Past Surgical History  Procedure Laterality Date  . Pacemaker placement    . Cardiac catheterization      clean   No family history on file. History  Substance Use Topics  . Smoking status: Former Games developer  . Smokeless tobacco: Never Used  . Alcohol Use: No    Review of Systems  All other systems reviewed and are negative.    Allergies  Review of patient's allergies indicates no known allergies.  Home Medications   Current Outpatient Rx  Name  Route  Sig  Dispense  Refill  . allopurinol (ZYLOPRIM) 100 MG tablet   Oral   Take 100 mg by mouth daily.         [name] aspirin EC 81 MG tablet    Oral   Take 81 mg by mouth daily.         . carvedilol (COREG) 12.5 MG tablet   Oral   Take 12.5 mg by mouth daily.         [name] DIGOX 125 MCG tablet   Oral   Take 0.125 mg by mouth daily. daily         . furosemide (LASIX) 20 MG tablet   Oral   Take 20 mg by mouth 2 (two) times daily.         [name] ibuprofen (ADVIL,MOTRIN) 800 MG tablet   Oral   Take 800 mg by mouth daily as needed for pain.         [name] losartan (COZAAR) 25 MG tablet   Oral   Take 1 tablet (25 mg total) by mouth daily.   90 tablet   3   . Multiple Vitamin (MULTIVITAMIN WITH MINERALS) TABS   Oral   Take 1 tablet by mouth daily.         . niacin 500 MG tablet   Oral   Take 1 tablet (500 mg total) by mouth daily with breakfast.   30 tablet   10   . simvastatin (ZOCOR) 80 MG tablet   Oral   Take 80 mg by  mouth at bedtime.         [name] oxyCODONE-acetaminophen (PERCOCET) 5-325 MG per tablet      Take one or two tablets by mouth every 4-6 hours as needed for pain.   40 tablet   0    BP 122/65  Pulse 81  Temp(Src) 97.4 F (36.3 C) (Axillary)  Resp 19  Ht 5\' 10"  (1.778 m)  Wt 177 lb (80.287 kg)  BMI 25.4 kg/m2  SpO2 100% Physical Exam  Nursing note and vitals reviewed. Constitutional: He is oriented to person, place, and time. He appears well-developed and well-nourished.  HENT:  Head: Normocephalic and atraumatic.  Right Ear: External ear normal.  Left Ear: External ear normal.  Oral cavity examination: upper and lower jaw, ridge, sutures in place. Small amount of diffuse, blood in the oral cavity. Left upper posterior with oozing consistent with venous bleeding- after one hour of compression with gauze, and clenching teeth. The nurses saw him initially indicates that this bleeding is improved.  Eyes: Conjunctivae and EOM are normal. Pupils are equal, round, and reactive to light.  Neck: Normal range of motion and phonation normal. Neck supple.  Cardiovascular: Normal rate, regular rhythm,  normal heart sounds and intact distal pulses.   Pulmonary/Chest: Effort normal and breath sounds normal. He exhibits no bony tenderness.  Abdominal: Soft. Normal appearance. There is no tenderness.  Musculoskeletal: Normal range of motion.  Neurological: He is alert and oriented to person, place, and time. He has normal strength. No cranial nerve deficit or sensory deficit. He exhibits normal muscle tone. Coordination normal.  Skin: Skin is warm, dry and intact.  Psychiatric: He has a normal mood and affect. His behavior is normal. Judgment and thought content normal.    ED Course  Procedures (including critical care time)\  Oral bleeding controlled with Quik-clot and gauze compression.   Labs Reviewed  CBC WITH DIFFERENTIAL - Abnormal; Notable for the following:    Hemoglobin 12.4 (*)    HCT 37.7 (*)    Platelets 142 (*)    Monocytes Relative 18 (*)    Monocytes Absolute 1.3 (*)    All other components within normal limits  COMPREHENSIVE METABOLIC PANEL - Abnormal; Notable for the following:    Sodium 134 (*)    Glucose, Bld 104 (*)    Albumin 3.2 (*)    Total Bilirubin 1.7 (*)    GFR calc non Af Amer 60 (*)    GFR calc Af Amer 70 (*)    All other components within normal limits  PROTIME-INR    1. Bleeding     MDM  Oral bleeding, venous. Controlled with pressure and clotting gauze. Pt is unlikely to have significant rebleeding. Doubt metabolic instability, serious bacterial infection or impending vascular collapse; the patient is stable for discharge.   Nursing Notes Reviewed/ Care Coordinated Applicable Imaging Reviewed Interpretation of Laboratory Data incorporated into ED treatment  Plan: Home Medications- avoid ibuprofen; Home Treatments- clear liquids only 1-2 days. Compression prn; return here if the recommended treatment, does not improve the symptoms; Recommended follow up- return prn  Flint Melter, MD 03/21/13 2045

## 2013-03-21 NOTE — ED Notes (Signed)
Gauze rolled & placed to right & left side of mouth, Instructed to bite down to stop bleeding. Family reports she "kept telling him to bite down all night". Gave ibuprofen 800mg  once yesterday at 1500

## 2013-03-21 NOTE — ED Notes (Signed)
Had all of his teeth extracted yesterday & c/o continued bleeding.

## 2013-03-24 ENCOUNTER — Encounter (HOSPITAL_COMMUNITY): Payer: Self-pay | Admitting: Dentistry

## 2013-03-26 IMAGING — CT CT ABD-PELV W/ CM
4 of 8 series · 13 of 46 positions shown, 19 images · IV contrast (READICAT/WATER & [ID] OMNI 300)
Comparison: None.

CLINICAL DATA: Abdominal pain, weight loss

CT ABDOMEN AND PELVIS WITH CONTRAST
TECHNIQUE: Multidetector CT imaging of the abdomen and pelvis was
performed following the standard protocol during bolus
administration of intravenous contrast.
Contrast: 100mL OMNIPAQUE IOHEXOL 300 MG/ML  SOLN

[Series 3: abd/pelvis with · axial · 0.70mm/px · z∈[-356,-102]mm · 4 of 87 slices shown, 9 images]
[im 18/87  soft-tissue]
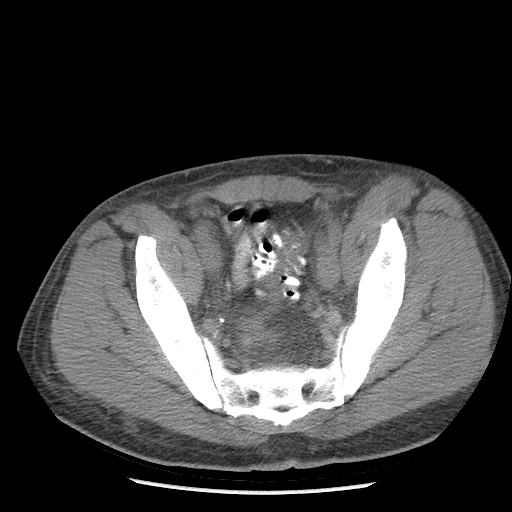
[im 18/87  lung]
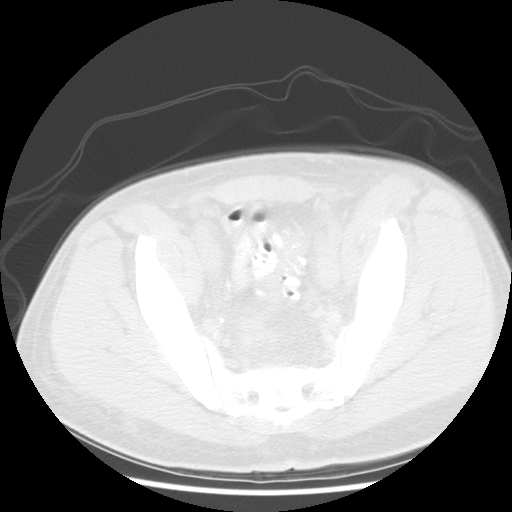
[im 18/87  bone]
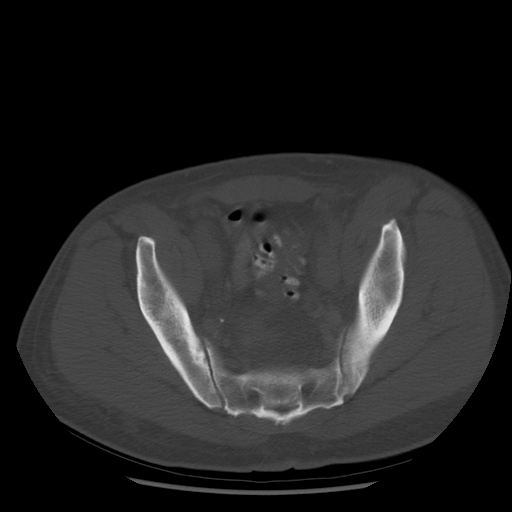
[im 35/87  soft-tissue]
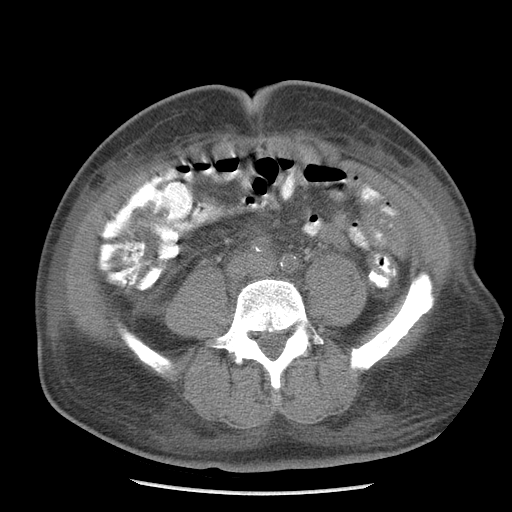
[im 35/87  lung]
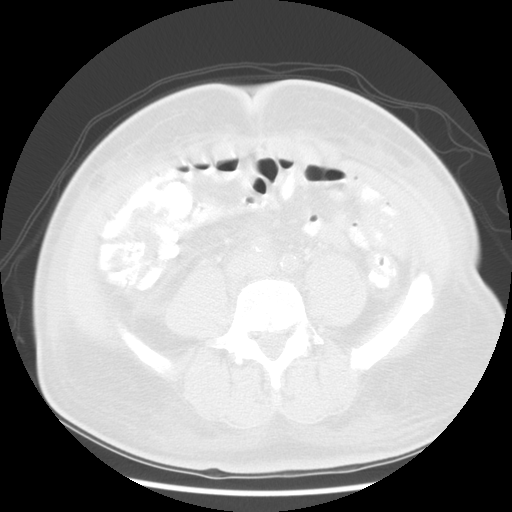
[im 52/87  soft-tissue]
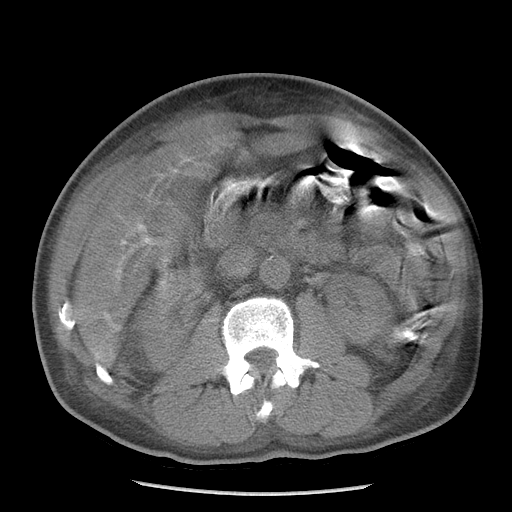
[im 52/87  lung]
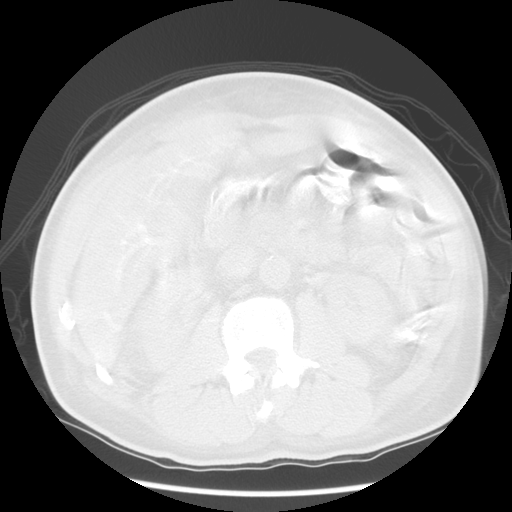
[im 69/87  soft-tissue]
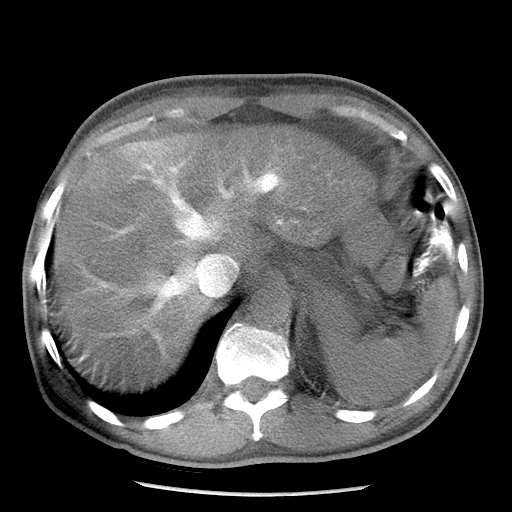
[im 69/87  lung]
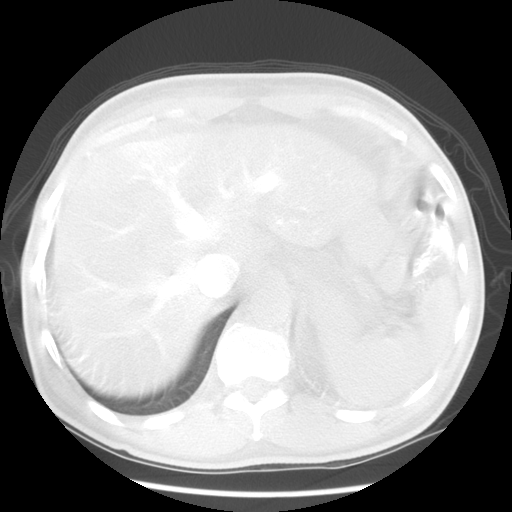

[Series 5: delay/repeat · axial · delayed · 0.70mm/px · z∈[-356,-96]mm · 4 of 86 slices shown]
[im 18/86  soft-tissue]
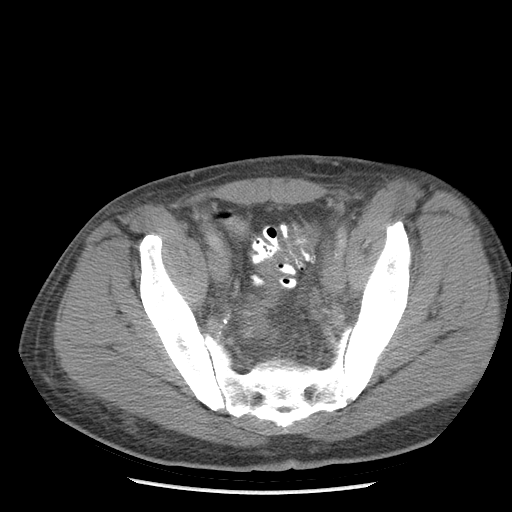
[im 35/86  soft-tissue]
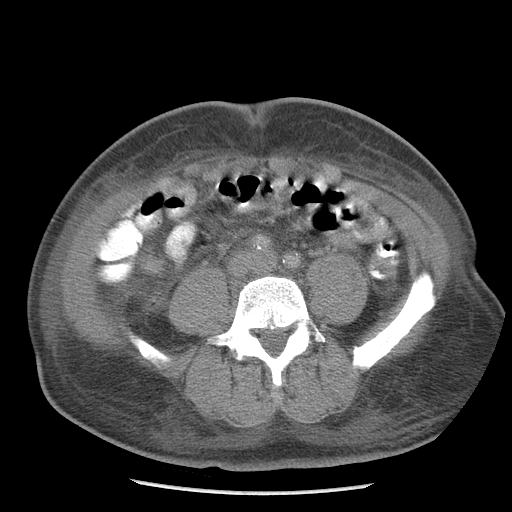
[im 52/86  soft-tissue]
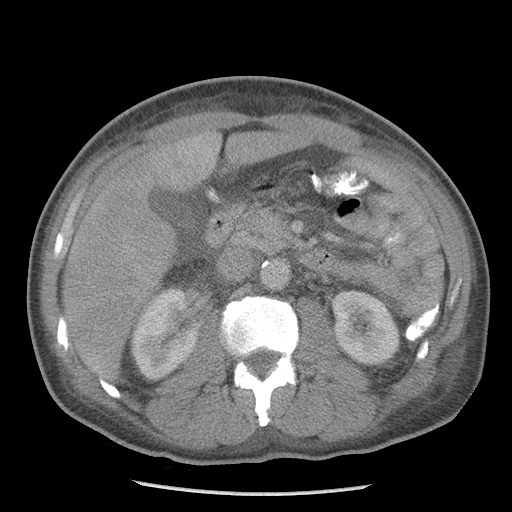
[im 69/86  soft-tissue]
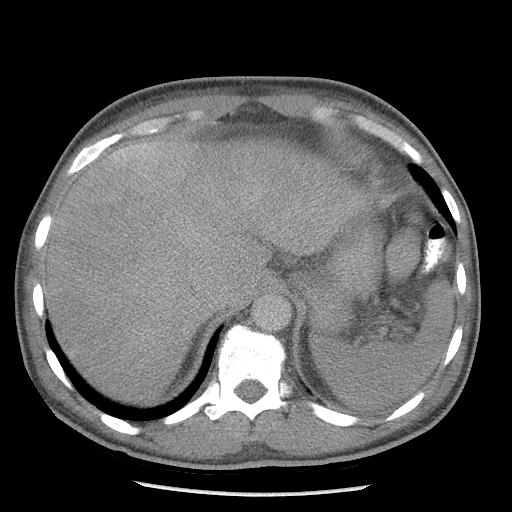

[Series 600: cor · coronal · 0.86mm/px · 3 of 120 slices shown, 4 images]
[im 40/120  soft-tissue]
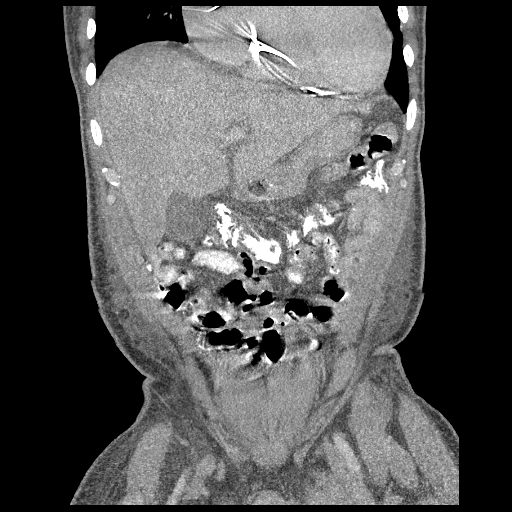
[im 53/120  soft-tissue]
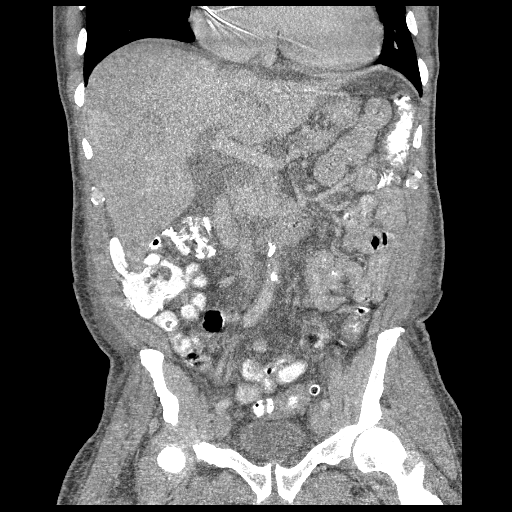
[im 53/120  bone]
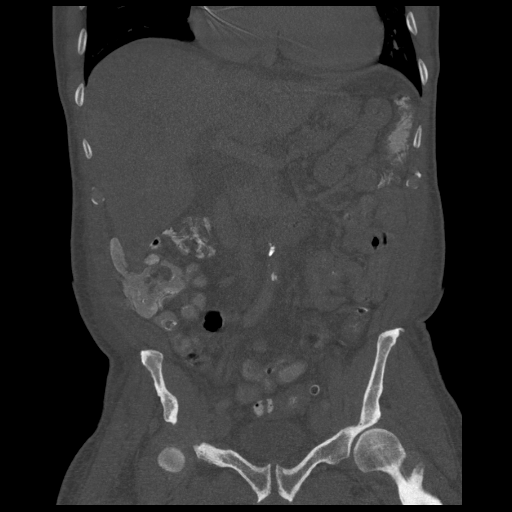
[im 67/120  soft-tissue]
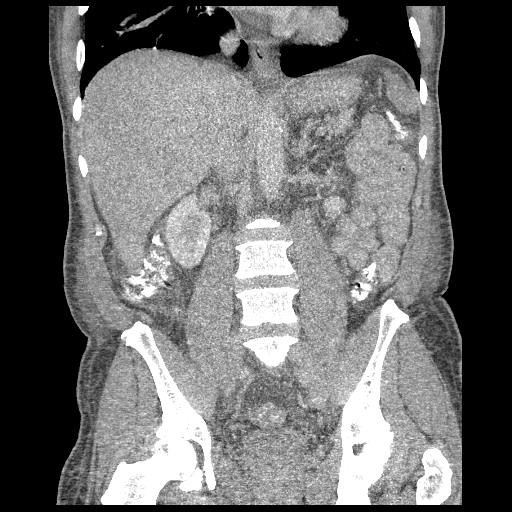

[Series 602: sagittal body · sagittal · 0.84mm/px · 2 of 145 slices shown]
[im 17/145  soft-tissue]
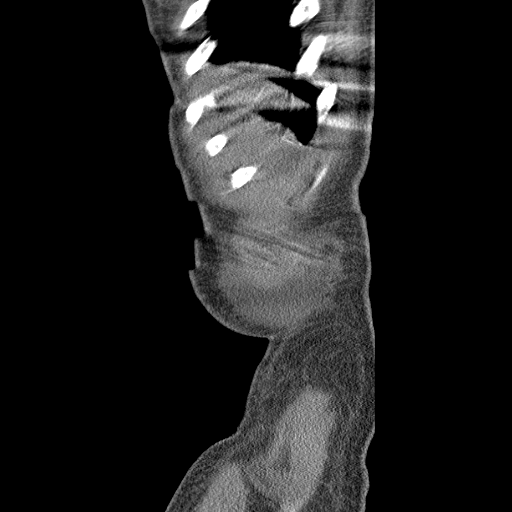
[im 33/145  soft-tissue]
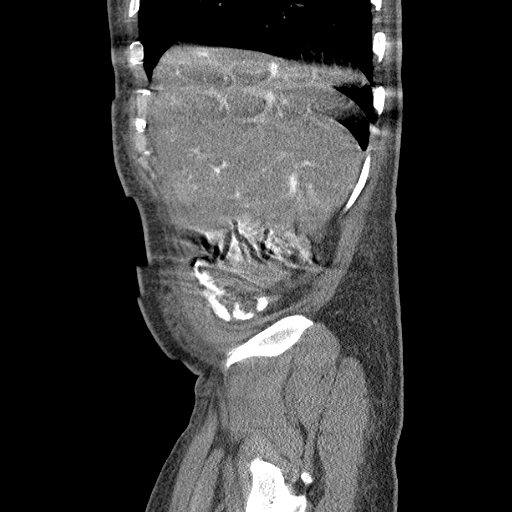

[13 of 46 positions shown; findings below may reference images not displayed]

FINDINGS: Initial images were motion degraded (series 3).  Repeated
delayed imaging was performed (series 5).

Lung bases are clear.

Cardiomegaly.

Heterogeneous hepatic perfusion, nonspecific.

Spleen, pancreas, and adrenal glands are within normal limits.

Gallbladder is contracted with associated gallbladder wall
thickening/edema.

Kidneys are within normal limits.  No hydronephrosis.

No evidence of bowel obstruction.  Although suboptimally visualized
due to motion, the appendix appears normal.

Atherosclerotic calcifications of the abdominal aorta and branch
vessels.

Small volume abdominopelvic ascites.

No suspicious abdominopelvic lymphadenopathy.

Prostate is mildly enlarged, measuring 5.0 cm.

Bladder is underdistended.

Mild degenerative changes of the visualized thoracolumbar spine.
IMPRESSION: Motion degraded images.

Heterogeneous perfusion of the liver with small volume
abdominopelvic ascites.

Contracted gallbladder with gallbladder wall thickening/edema.

While nonspecific, these findings raise the possibility of a right
upper quadrant inflammatory process such as hepatitis.  Correlate
with LFTs.

If a primary gallbladder process is suspected, consider RUQ
ultrasound or hepatobiliary nuclear medicine scan for further
evaluation.

## 2013-03-30 ENCOUNTER — Ambulatory Visit (HOSPITAL_COMMUNITY): Payer: Self-pay | Admitting: Dentistry

## 2013-03-30 ENCOUNTER — Encounter (HOSPITAL_COMMUNITY): Payer: Self-pay | Admitting: Dentistry

## 2013-03-30 VITALS — BP 126/73 | HR 65 | Temp 98.2°F

## 2013-03-30 DIAGNOSIS — K08409 Partial loss of teeth, unspecified cause, unspecified class: Secondary | ICD-10-CM

## 2013-03-30 DIAGNOSIS — K08109 Complete loss of teeth, unspecified cause, unspecified class: Secondary | ICD-10-CM

## 2013-03-30 DIAGNOSIS — Z0189 Encounter for other specified special examinations: Secondary | ICD-10-CM

## 2013-03-30 DIAGNOSIS — I340 Nonrheumatic mitral (valve) insufficiency: Secondary | ICD-10-CM

## 2013-03-30 NOTE — Patient Instructions (Signed)
PLAN: 1. Continue salt water rinses as needed to aid healing. 2. Followup with primary dentist of choice for fabrication of upper and lower complete dentures after adequate healing and once medically cleared by the heart surgeon. 3. Patient is now cleared for heart valve surgery.  [name], DDS

## 2013-03-30 NOTE — Progress Notes (Signed)
POST OPERATIVE NOTE:  03/30/2013 Russell Martin [id number]  VITALS: BP 126/73  Pulse 65  Temp(Src) 98.2 F (36.8 C)  Patient is status post multiple extractions with alveoloplasty on 03/20/2013. The patient now presents for evaluation of healing and suture removal as indicated.  SUBJECTIVE: Patient denies significant discomfort. Patient does have several sutures remaining. Patient indicates that he did have some postoperative bleeding on the day following the extractions then presented fever 0 for evaluation. The patient indicates that the bleeding was stopped with the use of gauze and pressure. Patient has not any problems since then. Patient indicates that he did not have to use any of the Percocet prescribed.  EXAM: There is no sign of infection, heme, or ooze. Sutures are loosely intact. Patient is now edentulous. Generalized primary closure is noted.  Oral hygiene is good and patient instructed to brush his tongue twice daily.  Procedure: 30 second chlorhexidine gluconate rinse. Sutures removed without complication.  ASSESSMENT: Post operative course is consistent with dental procedures performed in the OR.  PLAN: 1. Continue salt water rinses as needed to aid healing. 2. Followup with primary dentist of choice for fabrication of upper and lower complete dentures after adequate healing and once medically cleared by the heart surgeon. 3. Patient is now cleared for heart valve surgery.   [name], DDS

## 2013-04-02 ENCOUNTER — Other Ambulatory Visit (HOSPITAL_COMMUNITY): Payer: Self-pay | Admitting: Adult Health

## 2013-04-02 ENCOUNTER — Telehealth (HOSPITAL_COMMUNITY): Payer: Self-pay | Admitting: *Deleted

## 2013-04-02 ENCOUNTER — Encounter (HOSPITAL_COMMUNITY): Payer: Self-pay | Admitting: *Deleted

## 2013-04-02 DIAGNOSIS — I340 Nonrheumatic mitral (valve) insufficiency: Secondary | ICD-10-CM

## 2013-04-02 NOTE — Telephone Encounter (Signed)
Per Dr Gala Romney pt needs TEE to eval mitral regurg, Dr Barry Dienes is evaluating pt for surgery, have spoken with pt and TEE sch for 7/31 at 1pm pt aware and instructions reviewed with him via phone

## 2013-04-09 ENCOUNTER — Ambulatory Visit (HOSPITAL_COMMUNITY)
Admission: RE | Admit: 2013-04-09 | Discharge: 2013-04-09 | Disposition: A | Discharge status: Home / Self Care | Payer: Federal, State, Local not specified - PPO | Source: Ambulatory Visit | Attending: Internal Medicine | Admitting: Internal Medicine

## 2013-04-09 ENCOUNTER — Encounter (HOSPITAL_COMMUNITY)
Admission: RE | Disposition: A | Discharge status: Home / Self Care | Payer: Self-pay | Source: Ambulatory Visit | Attending: Internal Medicine

## 2013-04-09 ENCOUNTER — Encounter (HOSPITAL_COMMUNITY): Payer: Self-pay | Admitting: *Deleted

## 2013-04-09 DIAGNOSIS — I079 Rheumatic tricuspid valve disease, unspecified: Secondary | ICD-10-CM | POA: Insufficient documentation

## 2013-04-09 DIAGNOSIS — I270 Primary pulmonary hypertension: Secondary | ICD-10-CM | POA: Insufficient documentation

## 2013-04-09 DIAGNOSIS — I340 Nonrheumatic mitral (valve) insufficiency: Secondary | ICD-10-CM

## 2013-04-09 DIAGNOSIS — Z7982 Long term (current) use of aspirin: Secondary | ICD-10-CM | POA: Insufficient documentation

## 2013-04-09 DIAGNOSIS — Z79899 Other long term (current) drug therapy: Secondary | ICD-10-CM | POA: Insufficient documentation

## 2013-04-09 DIAGNOSIS — I080 Rheumatic disorders of both mitral and aortic valves: Secondary | ICD-10-CM | POA: Insufficient documentation

## 2013-04-09 DIAGNOSIS — J811 Chronic pulmonary edema: Secondary | ICD-10-CM | POA: Insufficient documentation

## 2013-04-09 DIAGNOSIS — Z87891 Personal history of nicotine dependence: Secondary | ICD-10-CM | POA: Insufficient documentation

## 2013-04-09 DIAGNOSIS — I700 Atherosclerosis of aorta: Secondary | ICD-10-CM | POA: Insufficient documentation

## 2013-04-09 DIAGNOSIS — I059 Rheumatic mitral valve disease, unspecified: Secondary | ICD-10-CM

## 2013-04-09 DIAGNOSIS — Z950 Presence of cardiac pacemaker: Secondary | ICD-10-CM | POA: Insufficient documentation

## 2013-04-09 DIAGNOSIS — I442 Atrioventricular block, complete: Secondary | ICD-10-CM | POA: Insufficient documentation

## 2013-04-09 DIAGNOSIS — E785 Hyperlipidemia, unspecified: Secondary | ICD-10-CM | POA: Insufficient documentation

## 2013-04-09 DIAGNOSIS — I5042 Chronic combined systolic (congestive) and diastolic (congestive) heart failure: Secondary | ICD-10-CM | POA: Insufficient documentation

## 2013-04-09 DIAGNOSIS — I10 Essential (primary) hypertension: Secondary | ICD-10-CM | POA: Insufficient documentation

## 2013-04-09 DIAGNOSIS — I428 Other cardiomyopathies: Secondary | ICD-10-CM | POA: Insufficient documentation

## 2013-04-09 DIAGNOSIS — I509 Heart failure, unspecified: Secondary | ICD-10-CM | POA: Insufficient documentation

## 2013-04-09 HISTORY — PX: TEE WITHOUT CARDIOVERSION: SHX5443

## 2013-04-09 SURGERY — ECHOCARDIOGRAM, TRANSESOPHAGEAL
Anesthesia: Moderate Sedation

## 2013-04-09 MED ORDER — LIDOCAINE VISCOUS 2 % MT SOLN
OROMUCOSAL | Status: DC | PRN
  Administered 2013-04-09: 15 mL via OROMUCOSAL

## 2013-04-09 MED ORDER — SODIUM CHLORIDE 0.9 % IV SOLN
INTRAVENOUS | Status: DC
  Administered 2013-04-09: 500 mL via INTRAVENOUS

## 2013-04-09 MED ORDER — FENTANYL CITRATE 0.05 MG/ML IJ SOLN
INTRAMUSCULAR | Status: DC | PRN
  Administered 2013-04-09 (×2): 25 ug via INTRAVENOUS

## 2013-04-09 MED ORDER — MIDAZOLAM HCL 5 MG/ML IJ SOLN
INTRAMUSCULAR | Status: AC
  Filled 2013-04-09: qty 3

## 2013-04-09 MED ORDER — MIDAZOLAM HCL 10 MG/2ML IJ SOLN
INTRAMUSCULAR | Status: DC | PRN
  Administered 2013-04-09 (×2): 2 mg via INTRAVENOUS
  Administered 2013-04-09: 1 mg via INTRAVENOUS
  Administered 2013-04-09: 2 mg via INTRAVENOUS

## 2013-04-09 MED ORDER — FENTANYL CITRATE 0.05 MG/ML IJ SOLN
INTRAMUSCULAR | Status: AC
  Filled 2013-04-09: qty 4

## 2013-04-09 MED ORDER — LIDOCAINE VISCOUS 2 % MT SOLN
OROMUCOSAL | Status: AC
  Filled 2013-04-09: qty 15

## 2013-04-09 NOTE — H&P (View-Only) (Signed)
[address]       [name] [address]             [phone]     CARDIOTHORACIC SURGERY CONSULTATION REPORT  Referring Provider is Bensimhon, Bevelyn Buckles, MD PCP is Cain Saupe, MD  Chief Complaint  Patient presents with  . Mitral Regurgitation    and Tricuspid Vave Regurg....eval and treat...ECHO 01/27/13.....last heart CATH 09/19/12  . Congestive Heart Failure    HPI:  Patient is a 62 year old African American male with long-standing history of dilated nonischemic cardiomyopathy and chronic combined systolic and diastolic congestive heart failure. The patient states that he first presented in 2001 with complete heart block and underwent placement of a permanent pacemaker. Until recently he had been followed by Dr. Alanda Amass. In 2012 he began to develop progressive symptoms of congestive heart failure.  Symptoms gradually progressed until this past December when he want up in the emergency department with class IV congestive heart failure pulmonary edema. He responded quickly to diuretic therapy. He ultimately was referred to Dr. Ladona Ridgel for biventricular pacemaker upgrade which was performed in March of this year.  With biventricular pacing the patient's symptoms of congestive heart failure has stabilized nicely, but followup echocardiogram performed in may revealed the presence of severe mitral regurgitation and severe tricuspid regurgitation.  The patient recently was referred to the congestive heart failure clinic where he was evaluated by Dr. Gala Romney on 03/03/2013.  Based upon review of the patient's most recent echocardiogram, Dr. Gala Romney felt that the patient might benefit from elective surgical intervention for mitral and tricuspid regurgitation. The patient has been referred for surgical consultation.  At present the patient reports stable symptoms of exertional shortness of breath which typically only occur with moderate physical activity, functional class  II. He denies resting shortness of breath, PND, orthopnea, or lower extremity edema. He states that these symptoms have all remained reasonably stable and improved since he began biventricular pacing last March. He has never had any chest pain or chest tightness. He has not had any tachypalpitations or dizzy spells.  Past Medical History  Diagnosis Date  . CHF (congestive heart failure)     a. NICM EF 35% - no CAD by cath 09/2012. b. s/p upgrade to Bi-V pacemaker 11/2012 (did not get ICD as Dr. Ladona Ridgel expects his EF will improve with bi-V pacing).  . Hyperlipidemia   . Hypertension     pt denies HTN  . CHB (complete heart block)     a. Pacer initially placed ~2002.     Past Surgical History  Procedure Laterality Date  . Pacemaker placement      No family history on file.  History   Social History  . Marital Status: Married    Spouse Name: N/A    Number of Children: N/A  . Years of Education: N/A   Occupational History  . Not on file.   Social History Main Topics  . Smoking status: Former Games developer  . Smokeless tobacco: Never Used  . Alcohol Use: No  . Drug Use: No  . Sexually Active: Yes   Other Topics Concern  . Not on file   Social History Narrative  . No narrative on file    Current Outpatient Prescriptions  Medication Sig Dispense Refill  . allopurinol (ZYLOPRIM) 100 MG tablet Take 100 mg by mouth daily.      [name] aspirin EC 81 MG tablet Take 81 mg by mouth daily.      [name]  carvedilol (COREG) 12.5 MG tablet Take 12.5 mg by mouth daily.      [name] DIGOX 125 MCG tablet daily      . furosemide (LASIX) 20 MG tablet Take 20 mg by mouth 2 (two) times daily.      [name] losartan (COZAAR) 25 MG tablet Take 1 tablet (25 mg total) by mouth daily.  90 tablet  3  . Multiple Vitamin (MULTIVITAMIN WITH MINERALS) TABS Take 1 tablet by mouth daily.      . niacin 500 MG tablet Take 1 tablet (500 mg total) by mouth daily with breakfast.  30 tablet  10  . simvastatin (ZOCOR) 80 MG tablet Take 80  mg by mouth at bedtime.       No current facility-administered medications for this visit.    No Known Allergies    Review of Systems:   General:  normal appetite, normal energy, no weight gain, no weight loss, no fever  Cardiac:  no chest pain with exertion, no chest pain at rest, + SOB with moderate exertion, no resting SOB, no PND, no orthopnea, no palpitations, no arrhythmia, no atrial fibrillation, no LE edema, no dizzy spells, no syncope  Respiratory:  + exertional shortness of breath, no home oxygen, no productive cough, occasional dry cough, no bronchitis, no wheezing, no hemoptysis, no asthma, no pain with inspiration or cough, no sleep apnea, no CPAP at night  GI:   no difficulty swallowing, no reflux, no frequent heartburn, no hiatal hernia, no abdominal pain, no constipation, no diarrhea, no hematochezia, no hematemesis, no melena  GU:   no dysuria,  + frequency, no urinary tract infection, no hematuria, no enlarged prostate, no kidney stones, mild kidney disease  Vascular:  no pain suggestive of claudication, + pain in feet, no leg cramps, no varicose veins, no DVT, no non-healing foot ulcer  Neuro:   no stroke, no TIA's, no seizures, no headaches, no temporary blindness one eye,  no slurred speech, no peripheral neuropathy, no chronic pain, no instability of gait, no memory/cognitive dysfunction  Musculoskeletal: no arthritis, no joint swelling, no myalgias, minor difficulty walking, normal mobility   Skin:   no rash, no itching, no skin infections, no pressure sores or ulcerations  Psych:   no anxiety, no depression, no nervousness, no unusual recent stress  Eyes:   no blurry vision, no floaters, no recent vision changes, does not wears glasses or contacts  ENT:   no hearing loss, + loose or painful teeth, no dentures, last saw dentist many years ago, teeth in poor condition  Hematologic:  no easy bruising, no abnormal bleeding, no clotting disorder, no frequent  epistaxis  Endocrine:  no diabetes, does not check CBG's at home     Physical Exam:   BP 129/74  Pulse 65  Resp 16  Ht 5' 10.5" (1.791 m)  Wt 176 lb 8 oz (80.06 kg)  BMI 24.96 kg/m2  SpO2 98%  General:    well-appearing  HEENT:  Unremarkable   Neck:   no JVD, no bruits, no adenopathy   Chest:   clear to auscultation, symmetrical breath sounds, no wheezes, no rhonchi   CV:   RRR, grade IV/VI holosystolic murmur   Abdomen:  soft, non-tender, no masses   Extremities:  warm, well-perfused, pulses palpable in groin, no LE edema  Rectal/GU  Deferred  Neuro:   Grossly non-focal and symmetrical throughout  Skin:   Clean and dry, no rashes, no breakdown   Diagnostic Tests:  Transthoracic  Echocardiography  Patient: Mohanad, Carsten MR #: [id number] Study Date: 01/27/2013 Gender: M Age: 21 Height: 177.8cm Weight: 85.7kg BSA: 2.74m^2 Pt. Status: Room:  Cheron Schaumann, MD [facility] PERFORMING Susa Griffins, MD [facility] REFERRING Susa Griffins, MD [facility] ATTENDING Rennis Golden, Iantha Fallen REFERRING Diablo, Iantha Fallen SONOGRAPHER Clearence Ped, RCS cc:  ------------------------------------------------------------ LV EF: 40% - 45%  ------------------------------------------------------------ Indications: 428.0 CHF.  ------------------------------------------------------------ History: PMH: Dyspnea. Primary pulmonary hypertension.  ------------------------------------------------------------ Study Conclusions  - Left ventricle: The cavity size was moderately dilated. There was moderate concentric hypertrophy. Systolic function was mildly to moderately reduced. The estimated ejection fraction was in the range of 40% to 45%. Moderate hypokinesis of the anteroseptal myocardium. - Aortic valve: Mild regurgitation. - Mitral valve: There was malcoaptation of the valve leaflets. Severe regurgitation directed eccentrically and posteriorly. - Left atrium: The appendage was  moderately to severely dilated. - Right ventricle: The cavity size was mildly dilated. Systolic pressure was increased. - Right atrium: The appendage was mildly dilated. - Atrial septum: No defect or patent foramen ovale was identified. - Tricuspid valve: Moderate regurgitation. - Pulmonary arteries: PA peak pressure: 58mm Hg (S). Impressions:  - EF improved s/p Bi-V pacing, Severe MR persists. NSR with Bi-V paced rhythm. The right ventricular systolic pressure was increased consistent with moderate pulmonary hypertension.  ------------------------------------------------------------ Labs, prior tests, procedures, and surgery: Permanent pacemaker system implantation.  Transthoracic echocardiography. M-mode, complete 2D, spectral Doppler, and color Doppler. Height: Height: 177.8cm. Height: 70in. Weight: Weight: 85.7kg. Weight: 188.6lb. Body mass index: BMI: 27.1kg/m^2. Body surface area: BSA: 2.69m^2. Blood pressure: 110/70. Patient status: Outpatient. Location: Echo laboratory.  ------------------------------------------------------------  ------------------------------------------------------------ Left ventricle: The cavity size was moderately dilated. There was moderate concentric hypertrophy. Systolic function was mildly to moderately reduced. The estimated ejection fraction wasapproximately 45% . Regional wall motion abnormalities: Moderate hypokinesis of the anteroseptal myocardium. No significant intra-ventricular dyssynchrony,  ------------------------------------------------------------ Aortic valve: Trileaflet. Doppler: There was no stenosis. Mild regurgitation. VTI ratio of LVOT to aortic valve: 0.47. Peak velocity ratio of LVOT to aortic valve: 0.47. Mean gradient: 5mm Hg (S).  ------------------------------------------------------------ Aorta: The aorta was normal, not dilated, and  non-diseased.  ------------------------------------------------------------ Mitral valve: No echocardiographic evidence for prolapse. There was malcoaptation of the valve leaflets. Doppler: Severe regurgitation with atrial reversal, directed eccentrically and posteriorly in modt views. Some views appeared central. Valve area by pressure half-time: 2.97cm^2. Indexed valve area by pressure half-time: 1.46cm^2/m^2. Mean gradient: 4mm Hg (D). Peak gradient: 12mm Hg (D).  ------------------------------------------------------------ Left atrium: LA volume/ BSA = 38.7 ml/m2 The appendage was moderately to severely dilated.  ------------------------------------------------------------ Atrial septum: No defect or patent foramen ovale was identified.  ------------------------------------------------------------ Right ventricle: The cavity size was mildly dilated. Pacer wire or catheter noted in right ventricle. Systolic pressure was increased.  ------------------------------------------------------------ Pulmonic valve: The valve appears to be grossly normal. Doppler: Trivial regurgitation.  ------------------------------------------------------------ Tricuspid valve: Structurally normal valve. Leaflet separation was normal. Doppler: Transvalvular velocity was within the normal range. Moderate regurgitation.  ------------------------------------------------------------ Right atrium: Pacer wire or catheter noted in right atrium. The appendage was mildly dilated.  ------------------------------------------------------------ Pericardium: The pericardium was normal in appearance.  ------------------------------------------------------------ Systemic veins: Inferior vena cava: The vessel was dilated; the respirophasic diameter changes were blunted (< 50%); findings are consistent with elevated central venous pressure. Diameter:  22mm.  ------------------------------------------------------------ Post procedure conclusions Ascending Aorta:  - The aorta was normal, not dilated, and non-diseased.  ------------------------------------------------------------  2D measurements Normal Doppler measurements Normal IVC Main pulmonary Diam 22 mm ------ artery Left ventricle Pressure,  58 mm Hg =30 LVID ED, 63.2 mm 43-52 S chord, Left ventricle PLAX Ea, lat 9.1 cm/s ------ LVID ES, 47.5 mm 23-38 ann, tiss chord, DP PLAX E/Ea, lat 14.9 ------ FS, chord, 25 % >29 ann, tiss 5 PLAX DP LVPW, ED 13.1 mm ------ Ea, med 9.1 cm/s ------ IVS/LVPW 0.87 <1.3 ann, tiss ratio, ED DP Vol ED, 213 ml ------ E/Ea, med 14.9 ------ MOD1 ann, tiss 5 Vol ES, 100 ml ------ DP MOD1 LVOT EF, MOD1 53 % ------ Peak vel, 71.4 cm/s ------ Vol index, 104 ml/m^2 ------ S ED, MOD1 VTI, S 10.6 cm ------ Vol index, 49 ml/m^2 ------ Aortic valve ES, MOD1 Peak vel, 151 cm/s ------ Vol ED, 232 ml ------ S MOD2 Mean vel, 100 cm/s ------ Vol ES, 109 ml ------ S MOD2 VTI, S 22.5 cm ------ EF, MOD2 53 % ------ Mean 5 mm Hg ------ Stroke 123 ml ------ gradient, vol, MOD2 S Vol index, 114 ml/m^2 ------ VTI ratio 0.47 ------ ED, MOD2 LVOT/AV Vol index, 53 ml/m^2 ------ Peak vel 0.47 ------ ES, MOD2 ratio, Stroke 60.3 ml/m^2 ------ LVOT/AV index, Regurg PHT 351 ms ------ MOD2 Mitral valve Ventricular septum Peak E vel 136 cm/s ------ IVS, ED 11.4 mm ------ Peak A vel 50.8 cm/s ------ Aorta Mean vel, 85.5 cm/s ------ Root diam, 28 mm ------ D ED Decelerati 173 ms 150-23 Left atrium on time 0 AP dim 57 mm ------ Pressure 74 ms ------ AP dim 2.79 cm/m^2 <2.2 half-time index Mean 4 mm Hg ------ Right ventricle gradient, RVID ED, 37.6 mm 19-38 D PLAX Peak 12 mm Hg ------ gradient, D Peak E/A 2.7 ------ ratio Area (PHT) 2.97 cm^2 ------ Area index 1.46 cm^2/m ------ (PHT) ^2 Annulus 35.8 cm ------ VTI Regurg 30.8 cm/s ------ alias  vel, PISA Max regurg 624 cm/s ------ vel Regurg VTI 181 cm ------ ERO, PISA 0.31 cm^2 ------ Regurg 56 ml ------ vol, PISA Tricuspid valve Regurg 345 cm/s ------ peak vel Peak RV-RA 48 mm Hg ------ gradient, S Systemic veins Estimated 10 mm Hg ------ CVP Right ventricle Pressure, 58 mm Hg <30 S Sa vel, 16.9 cm/s ------ lat ann, tiss DP  ------------------------------------------------------------ Prepared and Electronically Authenticated by  Susa Griffins, MD [facility] [id number]:[id number]        CARDIAC CATHETERIZATION   History obtained from chart review and the patient.  PROCEDURE DESCRIPTION:  The patient was brought to the second floor   Cardiac cath lab in the postabsorptive state. He was  premedicated with . His right groin  was prepped and shaved in usual sterile fashion. Xylocaine 1% was used  for local anesthesia. A 5 French sheath was inserted into the  RFA and 7 [place] into the RFV. CO was determined by the Fick and Thermodilution methods. 5 [place] FL4, FR4, and pigtail catheters were utilized. Hemostasis was obtained by manual compression.  HEMODYNAMICS:  RA: 11  RV: 58-60/10  PA: 60/28; mean 44  PC: 17; v wave 25  AO: 116/68  LV: 116/12  CO: 5.4 (Thermo); 5.3 l/m  CI: 2.6 2.5 l/m/m2  ANGIOGRAPHIC RESULTS:  1. Left main: Nl  2. LAD: Nl  3. Left circumflex: Nl.  4. Right coronary artery: Nl  LV: globally reduced EF 30 -35%  IMPRESSION: Nonischemic Cardiomyopathy  Per Dr. Kandis Cocking recommendation, I have discussed with Dr.Taylor concerning evaluation for Bi-Ventricular upgrade to see if LV function improves, and may ultimately require ICD.  [name], MD, [facility]  09/19/2012  11:20 AM  Impression:  Patient has long-standing history of dilated nonischemic cardiomyopathy with chronic combined systolic and diastolic congestive heart failure. Congestive heart failure progressed to the point of class III to class IV  symptoms by the end of this past year, but have recently improved and stabilized since the patient underwent biventricular pacemaker upgrade to commence biventricular pacing. However, followup echocardiogram demonstrates the presence of severe mitral and tricuspid regurgitation related to pure annular dilatation with type I mitral and tricuspid valve dysfunction.  I agree that it would be reasonable to consider elective mitral and tricuspid valve repair or replacement. The patient may be a good candidate for minimally invasive approach for surgery. The patient is noted to have very poor dentition.   Plan:  I reviewed the indications, risks, and potential benefits of mitral valve and tricuspid valve repair or replacement with the patient and his wife in the office today. Rationale for surgical intervention has been discussed. Surgical alternatives have been discussed.  I favor proceeding with transesophageal echocardiogram to further characterize the severity and functional anatomy of both mitral and tricuspid valve disease. We will refer the patient for dental consultation. I expect that he'll need dental extraction. We'll plan to see the patient back within 3-4 weeks after his TEE has been completed to discuss making plans for surgery in the near future.    Salvatore Decent. Cornelius Moras, MD 03/11/2013 5:08 PM

## 2013-04-09 NOTE — Interval H&P Note (Signed)
History and Physical Interval Note:  04/09/2013 2:57 PM  Russell Martin  has presented today for surgery, with the diagnosis of MITRAL REGURGITATION  The various methods of treatment have been discussed with the patient and family. After consideration of risks, benefits and other options for treatment, the patient has consented to  Procedure(s): TRANSESOPHAGEAL ECHOCARDIOGRAM (TEE) (N/A) as a surgical intervention .  The patient's history has been reviewed, patient examined, no change in status, stable for surgery.  I have reviewed the patient's chart and labs.  Questions were answered to the patient's satisfaction.     Donnamae Muilenburg

## 2013-04-09 NOTE — CV Procedure (Signed)
    TRANSESOPHAGEAL ECHOCARDIOGRAM   NAME:  Russell Martin   MRN: [id number] DOB:  Nov 21, 1950   ADMIT DATE: 04/09/2013  INDICATIONS: Mitral regurgitation   PROCEDURE:   Informed consent was obtained prior to the procedure. The risks, benefits and alternatives for the procedure were discussed and the patient comprehended these risks.  Risks include, but are not limited to, cough, sore throat, vomiting, nausea, somnolence, esophageal and stomach trauma or perforation, bleeding, low blood pressure, aspiration, pneumonia, infection, trauma to the teeth and death.    After a procedural time-out, the patient was given 7 mg versed and 50 mcg fentanyl for moderate sedation.  The oropharynx was anesthetized 10 cc of topical 1% viscous lidocaine.  The transesophageal probe was inserted in the esophagus and stomach without difficulty and multiple views were obtained.    COMPLICATIONS:    There were no immediate complications.  FINDINGS:  LEFT VENTRICLE: EF = 40%. No regional wall motion abnormalities.  RIGHT VENTRICLE: Normal size. Mild to moderate HK. + pacer wire  LEFT ATRIUM: Dilated  LEFT ATRIAL APPENDAGE: Not well visualized  RIGHT ATRIUM: Normal. + pacer wire  AORTIC VALVE:  Trileaflet. Mild AI. No AS  MITRAL VALVE:   Dilated annulus. Severe central MR with flow reversal in pulmonary veins  TRICUSPID VALVE: Moderate TR  PULMONIC VALVE: Grossly normal.  INTERATRIAL SEPTUM: No PFO or ASD.  PERICARDIUM: No effusion  DESCENDING AORTA: Mild plaque

## 2013-04-09 NOTE — Progress Notes (Signed)
  Echocardiogram Echocardiogram Transesophageal has been performed.  Russell Martin 04/09/2013, 4:22 PM

## 2013-04-10 ENCOUNTER — Encounter (HOSPITAL_COMMUNITY): Payer: Self-pay | Admitting: Internal Medicine

## 2013-04-20 ENCOUNTER — Ambulatory Visit (INDEPENDENT_AMBULATORY_CARE_PROVIDER_SITE_OTHER): Payer: Federal, State, Local not specified - PPO | Admitting: Thoracic Surgery (Cardiothoracic Vascular Surgery)

## 2013-04-20 ENCOUNTER — Encounter: Payer: Self-pay | Admitting: Thoracic Surgery (Cardiothoracic Vascular Surgery)

## 2013-04-20 VITALS — BP 125/74 | HR 59 | Resp 16 | Ht 70.5 in | Wt 177.1 lb

## 2013-04-20 DIAGNOSIS — I071 Rheumatic tricuspid insufficiency: Secondary | ICD-10-CM

## 2013-04-20 DIAGNOSIS — I340 Nonrheumatic mitral (valve) insufficiency: Secondary | ICD-10-CM

## 2013-04-20 DIAGNOSIS — I079 Rheumatic tricuspid valve disease, unspecified: Secondary | ICD-10-CM

## 2013-04-20 DIAGNOSIS — I059 Rheumatic mitral valve disease, unspecified: Secondary | ICD-10-CM

## 2013-04-20 DIAGNOSIS — I509 Heart failure, unspecified: Secondary | ICD-10-CM

## 2013-04-20 MED ORDER — AMIODARONE HCL 200 MG PO TABS: 200.0000 mg | ORAL_TABLET | Freq: Two times a day (BID) | ORAL | Status: DC

## 2013-04-20 NOTE — Progress Notes (Signed)
[address]       [name] [address]             [phone]     CARDIOTHORACIC SURGERY OFFICE NOTE  Referring Provider is Bensimhon, Bevelyn Buckles, MD PCP is [name], [name], MD   HPI:  Patient returns for followup of severe mitral regurgitation with history of dilated nonischemic cardiomyopathy and chronic combined systolic and diastolic congestive heart failure. He was originally seen in consultation on 03/11/2013. Since then he underwent dental extraction by Dr. Robin Searing and transesophageal echocardiogram by Dr. Gala Romney.  Transesophageal echocardiogram confirmed the presence of severe mitral regurgitation with a broad central jet and type I mitral valve dysfunction.  There was moderate tricuspid regurgitation and mild aortic insufficiency.  The patient returns to the office today to further discuss possible elective mitral valve and tricuspid valve repair. He reports no new problems over the past few weeks in particular he notes that he still has very symptoms of mild exertional shortness of breath, functional class II at most.   Current Outpatient Prescriptions  Medication Sig Dispense Refill  . allopurinol (ZYLOPRIM) 100 MG tablet Take 100 mg by mouth daily.      [name] aspirin EC 81 MG tablet Take 81 mg by mouth daily.      . carvedilol (COREG) 12.5 MG tablet Take 12.5 mg by mouth daily.      [name] DIGOX 125 MCG tablet Take 0.125 mg by mouth daily. daily      . furosemide (LASIX) 20 MG tablet Take 20 mg by mouth 2 (two) times daily.      [name] losartan (COZAAR) 25 MG tablet Take 1 tablet (25 mg total) by mouth daily.  90 tablet  3  . Multiple Vitamin (MULTIVITAMIN WITH MINERALS) TABS Take 1 tablet by mouth daily.      . niacin 500 MG tablet Take 1 tablet (500 mg total) by mouth daily with breakfast.  30 tablet  10  . simvastatin (ZOCOR) 80 MG tablet Take 80 mg by mouth at bedtime.       No current facility-administered medications for this visit.      Physical  Exam:   BP 125/74  Pulse 59  Resp 16  Ht 5' 10.5" (1.791 m)  Wt 177 lb 0.8 oz (80.309 kg)  BMI 25.04 kg/m2  SpO2 99%  General:  Well-appearing  Chest:   Clear to auscultation  CV:   Regular rate and rhythm with grade 3/6 systolic murmur  Incisions:  n/a  Abdomen:  Soft and nontender  Extremities:  Warm and well-perfused with no lower extremity edema  Diagnostic Tests:  TRANSESOPHAGEAL ECHOCARDIOGRAM   NAME: ILIAN WESSELL MRN: [id number]  DOB: 1951-07-26 ADMIT DATE: 04/09/2013  INDICATIONS: Mitral regurgitation  PROCEDURE:  Informed consent was obtained prior to the procedure. The risks, benefits and alternatives for the procedure were discussed and the patient comprehended these risks. Risks include, but are not limited to, cough, sore throat, vomiting, nausea, somnolence, esophageal and stomach trauma or perforation, bleeding, low blood pressure, aspiration, pneumonia, infection, trauma to the teeth and death.  After a procedural time-out, the patient was given 7 mg versed and 50 mcg fentanyl for moderate sedation. The oropharynx was anesthetized 10 cc of topical 1% viscous lidocaine. The transesophageal probe was inserted in the esophagus and stomach without difficulty and multiple views were obtained.  COMPLICATIONS:  There were no immediate complications.  FINDINGS:  LEFT VENTRICLE: EF = 40%. No regional  wall motion abnormalities.   RIGHT VENTRICLE: Normal size. Mild to moderate HK. + pacer wire  LEFT ATRIUM: Dilated  LEFT ATRIAL APPENDAGE: Not well visualized  RIGHT ATRIUM: Normal. + pacer wire  AORTIC VALVE: Trileaflet. Mild AI. No AS  MITRAL VALVE: Dilated annulus. Severe central MR with flow reversal in pulmonary veins  TRICUSPID VALVE: Moderate TR  PULMONIC VALVE: Grossly normal.  INTERATRIAL SEPTUM: No PFO or ASD.  PERICARDIUM: No effusion  DESCENDING AORTA: Mild plaque          Impression:  Patient has long-standing history of dilated nonischemic  cardiomyopathy with chronic combined systolic and diastolic congestive heart failure. Congestive heart failure progressed to the point of class III to class IV symptoms by the end of this past year, but have recently improved and stabilized since the patient underwent biventricular pacemaker upgrade to commence biventricular pacing. At present he remains remarkably stable with functionally class II symptoms that most.  However, followup echocardiogram demonstrates the presence of severe mitral and tricuspid regurgitation related to pure annular dilatation with type I mitral and tricuspid valve dysfunction. Alternatives include continued medical therapy with close followup versus proceeding with elective mitral and tricuspid valve repair or replacement. The patient may be a good candidate for minimally invasive approach for surgery.    Plan:  I have again reviewed the indications, risks, and potential benefits of elective mitral and tricuspid valve repair or replacement with the patient and his wife in the office today. Alternative treatment strategies been discussed in detail including continued medical therapy for the time being versus proceeding with elective surgery.  The likelihood of successful and durable valve repair has been discussed with particular reference to the findings of their recent echocardiogram.  Based upon these findings and previous experience, I have quoted them a greater than 80 percent likelihood of successful valve repair.  In the unlikely event that their valve cannot be successfully repaired, we discussed the possibility of replacing the mitral valve using a mechanical prosthesis with the attendant need for long-term anticoagulation versus the alternative of replacing it using a bioprosthetic tissue valve with its potential for late structural valve deterioration and failure, depending upon the patient's longevity.  The patient specifically requests that if the mitral valve must be  replaced that it be done using a bioprosthetic tissue valve.  Alternative surgical approaches have been discussed, including a comparison between conventional sternotomy and minimally-invasive techniques.  The relative risks and benefits of each have been reviewed as they pertain to the patient's specific circumstances, and all of their questions have been addressed.  The patient is eager to proceed with surgery in the near future while he remains clinically stable. He is interested in minimally invasive approach for surgery feasible. We will obtain CT angiogram of the chest abdomen and pelvis to evaluate arterial access for surgery. We will tentatively plan to proceed with surgery on Wednesday, August 20.  The patient has been given a prescription for amiodarone to begin one week prior to surgery to decrease his risk of perioperative atrial arrhythmias. He has been instructed to stop taking Zocor temporarily while he is on amiodarone.   Salvatore Decent. Cornelius Moras, MD 04/20/2013 5:59 PM

## 2013-04-20 NOTE — Patient Instructions (Addendum)
Begin amiodarone 1 week prior to surgery and stop taking Zocor (simvastatin).  Continue all other medications without change through the night before surgery.  On the morning of surgery take only Coreg (carvedilol) with a sip of water.

## 2013-04-21 ENCOUNTER — Encounter (HOSPITAL_COMMUNITY): Payer: Self-pay | Admitting: Pharmacy Technician

## 2013-04-21 ENCOUNTER — Other Ambulatory Visit: Payer: Self-pay | Admitting: *Deleted

## 2013-04-21 ENCOUNTER — Other Ambulatory Visit: Payer: Self-pay | Admitting: Thoracic Surgery (Cardiothoracic Vascular Surgery)

## 2013-04-21 DIAGNOSIS — I340 Nonrheumatic mitral (valve) insufficiency: Secondary | ICD-10-CM

## 2013-04-21 DIAGNOSIS — I059 Rheumatic mitral valve disease, unspecified: Secondary | ICD-10-CM

## 2013-04-21 DIAGNOSIS — I5042 Chronic combined systolic (congestive) and diastolic (congestive) heart failure: Secondary | ICD-10-CM

## 2013-04-21 DIAGNOSIS — R519 Headache, unspecified: Secondary | ICD-10-CM

## 2013-04-21 DIAGNOSIS — I071 Rheumatic tricuspid insufficiency: Secondary | ICD-10-CM

## 2013-04-21 DIAGNOSIS — I719 Aortic aneurysm of unspecified site, without rupture: Secondary | ICD-10-CM

## 2013-04-23 ENCOUNTER — Ambulatory Visit
Admission: RE | Admit: 2013-04-23 | Discharge: 2013-04-23 | Disposition: A | Discharge status: Home / Self Care | Payer: Federal, State, Local not specified - PPO | Source: Ambulatory Visit | Attending: Thoracic Surgery (Cardiothoracic Vascular Surgery) | Admitting: Thoracic Surgery (Cardiothoracic Vascular Surgery)

## 2013-04-23 DIAGNOSIS — R519 Headache, unspecified: Secondary | ICD-10-CM

## 2013-04-23 DIAGNOSIS — I340 Nonrheumatic mitral (valve) insufficiency: Secondary | ICD-10-CM

## 2013-04-23 DIAGNOSIS — I5042 Chronic combined systolic (congestive) and diastolic (congestive) heart failure: Secondary | ICD-10-CM

## 2013-04-23 DIAGNOSIS — I719 Aortic aneurysm of unspecified site, without rupture: Secondary | ICD-10-CM

## 2013-04-23 MED ORDER — IOHEXOL 350 MG/ML SOLN: 100.0000 mL | Freq: Once | INTRAVENOUS | Status: AC | PRN

## 2013-04-27 ENCOUNTER — Encounter (HOSPITAL_COMMUNITY): Payer: Federal, State, Local not specified - PPO

## 2013-04-27 ENCOUNTER — Encounter (HOSPITAL_COMMUNITY): Payer: Self-pay

## 2013-04-27 ENCOUNTER — Encounter: Payer: Self-pay | Admitting: Thoracic Surgery (Cardiothoracic Vascular Surgery)

## 2013-04-27 ENCOUNTER — Ambulatory Visit (HOSPITAL_COMMUNITY)
Admission: RE | Admit: 2013-04-27 | Discharge: 2013-04-27 | Disposition: A | Discharge status: Home / Self Care | Payer: Federal, State, Local not specified - PPO | Source: Ambulatory Visit | Attending: Thoracic Surgery (Cardiothoracic Vascular Surgery) | Admitting: Thoracic Surgery (Cardiothoracic Vascular Surgery)

## 2013-04-27 ENCOUNTER — Encounter (HOSPITAL_COMMUNITY)
Admission: RE | Admit: 2013-04-27 | Discharge: 2013-04-27 | Disposition: A | Discharge status: Home / Self Care | Payer: Federal, State, Local not specified - PPO | Source: Ambulatory Visit | Attending: Thoracic Surgery (Cardiothoracic Vascular Surgery) | Admitting: Thoracic Surgery (Cardiothoracic Vascular Surgery)

## 2013-04-27 VITALS — BP 144/75 | HR 60 | Temp 99.3°F | Resp 18 | Ht 70.0 in | Wt 182.8 lb

## 2013-04-27 DIAGNOSIS — Z01810 Encounter for preprocedural cardiovascular examination: Secondary | ICD-10-CM | POA: Insufficient documentation

## 2013-04-27 DIAGNOSIS — R9431 Abnormal electrocardiogram [ECG] [EKG]: Secondary | ICD-10-CM | POA: Insufficient documentation

## 2013-04-27 DIAGNOSIS — I071 Rheumatic tricuspid insufficiency: Secondary | ICD-10-CM

## 2013-04-27 DIAGNOSIS — I059 Rheumatic mitral valve disease, unspecified: Secondary | ICD-10-CM

## 2013-04-27 DIAGNOSIS — Z950 Presence of cardiac pacemaker: Secondary | ICD-10-CM | POA: Insufficient documentation

## 2013-04-27 DIAGNOSIS — I517 Cardiomegaly: Secondary | ICD-10-CM | POA: Insufficient documentation

## 2013-04-27 DIAGNOSIS — I10 Essential (primary) hypertension: Secondary | ICD-10-CM | POA: Insufficient documentation

## 2013-04-27 DIAGNOSIS — Z01812 Encounter for preprocedural laboratory examination: Secondary | ICD-10-CM | POA: Insufficient documentation

## 2013-04-27 DIAGNOSIS — I509 Heart failure, unspecified: Secondary | ICD-10-CM | POA: Insufficient documentation

## 2013-04-27 DIAGNOSIS — Z01818 Encounter for other preprocedural examination: Secondary | ICD-10-CM | POA: Insufficient documentation

## 2013-04-27 LAB — SURGICAL PCR SCREEN
MRSA, PCR: NEGATIVE
Staphylococcus aureus: NEGATIVE

## 2013-04-27 LAB — BLOOD GAS, ARTERIAL
Acid-Base Excess: 1.6 mmol/L (ref 0.0–2.0)
Drawn by: 344381
O2 Saturation: 98.9 %
TCO2: 26.1 mmol/L (ref 0–100)
pCO2 arterial: 35.2 mmHg (ref 35.0–45.0)
pO2, Arterial: 101 mmHg — ABNORMAL HIGH (ref 80.0–100.0)

## 2013-04-27 LAB — COMPREHENSIVE METABOLIC PANEL
AST: 29 U/L (ref 0–37)
BUN: 20 mg/dL (ref 6–23)
CO2: 20 mEq/L (ref 19–32)
Calcium: 9.7 mg/dL (ref 8.4–10.5)
Chloride: 102 mEq/L (ref 96–112)
Creatinine, Ser: 1.29 mg/dL (ref 0.50–1.35)
GFR calc Af Amer: 68 mL/min — ABNORMAL LOW (ref >90)
GFR calc non Af Amer: 59 mL/min — ABNORMAL LOW (ref >90)
Glucose, Bld: 111 mg/dL — ABNORMAL HIGH (ref 70–99)
Total Bilirubin: 0.5 mg/dL (ref 0.3–1.2)

## 2013-04-27 LAB — CBC
HCT: 37.8 % — ABNORMAL LOW (ref 39.0–52.0)
Hemoglobin: 12.5 g/dL — ABNORMAL LOW (ref 13.0–17.0)
MCH: 27.1 pg (ref 26.0–34.0)
MCV: 82 fL (ref 78.0–100.0)
Platelets: 161 10*3/uL (ref 150–400)
RBC: 4.61 MIL/uL (ref 4.22–5.81)
WBC: 6.5 10*3/uL (ref 4.0–10.5)

## 2013-04-27 LAB — PULMONARY FUNCTION TEST

## 2013-04-27 LAB — URINALYSIS, ROUTINE W REFLEX MICROSCOPIC
Bilirubin Urine: NEGATIVE
Hgb urine dipstick: NEGATIVE
Nitrite: NEGATIVE
Specific Gravity, Urine: 1.015 (ref 1.005–1.030)
Urobilinogen, UA: 1 mg/dL (ref 0.0–1.0)
pH: 6.5 (ref 5.0–8.0)

## 2013-04-27 LAB — ABO/RH: ABO/RH(D): O POS

## 2013-04-27 LAB — PROTIME-INR: Prothrombin Time: 13.9 seconds (ref 11.6–15.2)

## 2013-04-27 LAB — TYPE AND SCREEN: Antibody Screen: NEGATIVE

## 2013-04-27 LAB — HEMOGLOBIN A1C: Mean Plasma Glucose: 108 mg/dL (ref <117)

## 2013-04-27 LAB — APTT: aPTT: 29 seconds (ref 24–37)

## 2013-04-27 NOTE — Progress Notes (Addendum)
Patient has had dopplers & PFT's done today. He told me that he had a sleep study done [facility] --I requested those today.  I have faxed Pacer form to [place].  DA

## 2013-04-27 NOTE — Progress Notes (Signed)
Pre-op Cardiac Surgery  Carotid Findings:  1-39% ICA stenosis.  Vertebral artery flow is antegrade.  Upper Extremity Right Left  Brachial Pressures 154T 143T  Radial Waveforms T T  Ulnar Waveforms T T  Palmar Arch (Allen's Test) WNL Doppler signal obliterates with radial compression and remains normal with ulnar compression   Findings:      Lower  Extremity Right Left  Dorsalis Pedis    Anterior Tibial    Posterior Tibial    Ankle/Brachial Indices      Findings:

## 2013-04-27 NOTE — Pre-Procedure Instructions (Signed)
Russell Martin  04/27/2013   Your procedure is scheduled on:  Wednesday, August 20th   Report to [facility] at  6:30 AM.  Call this number if you have problems the morning of surgery: [phone]   Remember:   Do not eat food or drink liquids after midnight Tuesday.   Take these medicines the morning of surgery with A SIP OF WATER: Carvedilol,   Do not wear jewelry.  Do not wear lotions, powders, or colognes. You may NOT wear deodorant.   Men may shave face and neck.   Do not bring valuables to the hospital.  [facility] is not responsible for any belongings or valuables.  Contacts, dentures or bridgework may not be worn into surgery.   Leave suitcase in the car. After surgery it may be brought to your room.  For patients admitted to the hospital, checkout time is 11:00 AM the day of discharge.   Name and phone number of your driver:  Russell Martin -- SPOUSE   Special Instructions: Shower using CHG 2 nights before surgery and the night before surgery.  If you shower the day of surgery use CHG.  Use special wash - you have one bottle of CHG for all showers.  You should use approximately 1/3 of the bottle for each shower.   Please read over the following fact sheets that you were given: Pain Booklet, Coughing and Deep Breathing, Blood Transfusion Information, MRSA Information and Surgical Site Infection Prevention

## 2013-04-28 ENCOUNTER — Encounter (HOSPITAL_COMMUNITY): Payer: Self-pay

## 2013-04-28 ENCOUNTER — Ambulatory Visit (HOSPITAL_BASED_OUTPATIENT_CLINIC_OR_DEPARTMENT_OTHER)
Admission: RE | Admit: 2013-04-28 | Discharge: 2013-04-28 | Disposition: A | Discharge status: Home / Self Care | Payer: Federal, State, Local not specified - PPO | Source: Ambulatory Visit | Attending: Internal Medicine | Admitting: Internal Medicine

## 2013-04-28 VITALS — BP 120/68 | HR 65 | Ht 70.0 in | Wt 183.8 lb

## 2013-04-28 DIAGNOSIS — I340 Nonrheumatic mitral (valve) insufficiency: Secondary | ICD-10-CM

## 2013-04-28 DIAGNOSIS — I071 Rheumatic tricuspid insufficiency: Secondary | ICD-10-CM

## 2013-04-28 DIAGNOSIS — I5022 Chronic systolic (congestive) heart failure: Secondary | ICD-10-CM

## 2013-04-28 DIAGNOSIS — I059 Rheumatic mitral valve disease, unspecified: Secondary | ICD-10-CM

## 2013-04-28 DIAGNOSIS — I079 Rheumatic tricuspid valve disease, unspecified: Secondary | ICD-10-CM

## 2013-04-28 MED ORDER — VANCOMYCIN HCL 1000 MG IV SOLR
INTRAVENOUS | Status: AC
  Administered 2013-04-29: 11:00:00
  Filled 2013-04-28: qty 1000

## 2013-04-28 MED ORDER — SODIUM CHLORIDE 0.9 % IV SOLN
INTRAVENOUS | Status: AC
  Administered 2013-04-29: 69 mL/h via INTRAVENOUS
  Filled 2013-04-28: qty 40

## 2013-04-28 MED ORDER — METOPROLOL TARTRATE 12.5 MG HALF TABLET: 12.5000 mg | ORAL_TABLET | Freq: Once | ORAL | Status: DC

## 2013-04-28 MED ORDER — NITROGLYCERIN IN D5W 200-5 MCG/ML-% IV SOLN
2.0000 ug/min | INTRAVENOUS | Status: AC
  Administered 2013-04-29: 16 ug/min via INTRAVENOUS
  Filled 2013-04-28: qty 250

## 2013-04-28 MED ORDER — CHLORHEXIDINE GLUCONATE 4 % EX LIQD: 30.0000 mL | CUTANEOUS | Status: DC

## 2013-04-28 MED ORDER — EPINEPHRINE HCL 1 MG/ML IJ SOLN
0.5000 ug/min | INTRAVENOUS | Status: DC
  Filled 2013-04-28: qty 4

## 2013-04-28 MED ORDER — POTASSIUM CHLORIDE 2 MEQ/ML IV SOLN
80.0000 meq | INTRAVENOUS | Status: DC
  Filled 2013-04-28: qty 40

## 2013-04-28 MED ORDER — SODIUM CHLORIDE 0.9 % IV SOLN
INTRAVENOUS | Status: DC
  Filled 2013-04-28: qty 30

## 2013-04-28 MED ORDER — DOPAMINE-DEXTROSE 3.2-5 MG/ML-% IV SOLN
2.0000 ug/kg/min | INTRAVENOUS | Status: AC
  Administered 2013-04-29: 3 ug/kg/min via INTRAVENOUS
  Filled 2013-04-28: qty 250

## 2013-04-28 MED ORDER — PHENYLEPHRINE HCL 10 MG/ML IJ SOLN
30.0000 ug/min | INTRAVENOUS | Status: AC
  Administered 2013-04-29: 10 ug/min via INTRAVENOUS
  Filled 2013-04-28: qty 2

## 2013-04-28 MED ORDER — DEXMEDETOMIDINE HCL IN NACL 400 MCG/100ML IV SOLN
0.1000 ug/kg/h | INTRAVENOUS | Status: AC
  Administered 2013-04-29: 0.2 ug/kg/h via INTRAVENOUS
  Filled 2013-04-28: qty 100

## 2013-04-28 MED ORDER — INSULIN REGULAR HUMAN 100 UNIT/ML IJ SOLN
INTRAMUSCULAR | Status: AC
  Administered 2013-04-29: 2.2 [IU]/h via INTRAVENOUS
  Filled 2013-04-28: qty 1

## 2013-04-28 MED ORDER — DEXTROSE 5 % IV SOLN
750.0000 mg | INTRAVENOUS | Status: DC
  Filled 2013-04-28: qty 750

## 2013-04-28 MED ORDER — CEFUROXIME SODIUM 1.5 G IJ SOLR
1.5000 g | INTRAMUSCULAR | Status: AC
  Administered 2013-04-29: .75 g via INTRAVENOUS
  Administered 2013-04-29: 1.5 g via INTRAVENOUS
  Filled 2013-04-28 (×2): qty 1.5

## 2013-04-28 MED ORDER — PLASMA-LYTE 148 IV SOLN
INTRAVENOUS | Status: DC
  Filled 2013-04-28: qty 2.5

## 2013-04-28 MED ORDER — MAGNESIUM SULFATE 50 % IJ SOLN
40.0000 meq | INTRAMUSCULAR | Status: DC
  Filled 2013-04-28: qty 10

## 2013-04-28 MED ORDER — VANCOMYCIN HCL 10 G IV SOLR
1250.0000 mg | INTRAVENOUS | Status: AC
  Administered 2013-04-29: 1250 mg via INTRAVENOUS
  Filled 2013-04-28 (×2): qty 1250

## 2013-04-28 NOTE — Progress Notes (Signed)
Patient ID: [name], male   DOB: 1951/09/03, 62 y.o.   MRN: [id number] EP: Dr Ladona Ridgel PCP: Dr Jillyn Hidden Cardiologist: Dr Cornelius Moras  HPI: Mr Leggette is a 62 year old with PMH of systolic HF due to NICM (EF 35%) s/p BiVICD, complete heart block 2001 S/P permanent pacemaker, gout, HTN, MR, TR, and hyperlipidemia. He was previously followed by Dr. Alanda Amass,  09/2012 LHC/RHC No CAD. RHC RA: 11  RV: 58-60/10  PA: 60/28; mean 44  PCWP: 17; v wave 25  LV: 116/12  CO: 5.4 (Thermo); 5.3 l/m  CI: 2.6 2.5 l/m/m2    01/27/13 ECHO EF 45-45% Severe MR Mod-Severe TR  04/09/13 TEE EF 40% Mod TR Severe central MR  He returns for follow up with his wife. Plan for MVR and TVR tomorrow.  Able to walk 15-20 minutes at a time. Weight at home 177-181 pounds. Denies SOB/PND/Orthopnea. Compliant with medications.  Works full time as a Engineer, materials at [employer].      Past Medical History  Diagnosis Date  . CHF (congestive heart failure)     a. NICM EF 35% - no CAD by cath 09/2012. b. s/p upgrade to Bi-V pacemaker 11/2012 (did not get ICD as Dr. Ladona Ridgel expects his EF will improve with bi-V pacing).  . Hyperlipidemia   . Hypertension     pt denies HTN  . CHB (complete heart block)     a. Pacer initially placed ~2002.   . Mitral valve regurgitation   . Pacemaker     st jude    greg taylor    Current Outpatient Prescriptions  Medication Sig Dispense Refill  . amiodarone (PACERONE) 200 MG tablet Take 200 mg by mouth 2 (two) times daily.      [name] aspirin EC 81 MG tablet Take 81 mg by mouth daily.      . carvedilol (COREG) 12.5 MG tablet Take 12.5 mg by mouth daily.      . digoxin (LANOXIN) 0.125 MG tablet Take 0.125 mg by mouth daily.      . furosemide (LASIX) 20 MG tablet Take 20 mg by mouth 2 (two) times daily.      [name] losartan (COZAAR) 25 MG tablet Take 25 mg by mouth daily.      . Multiple Vitamin (MULTIVITAMIN WITH MINERALS) TABS Take 1 tablet by mouth daily.      . niacin 500 MG tablet Take 500 mg by mouth  daily with breakfast.      . simvastatin (ZOCOR) 80 MG tablet Take 80 mg by mouth at bedtime.       No current facility-administered medications for this encounter.     No Known Allergies  History   Social History  . Marital Status: Married    Spouse Name: N/A    Number of Children: 1  . Years of Education: N/A   Occupational History  .     Social History Main Topics  . Smoking status: Former Games developer  . Smokeless tobacco: Never Used  . Alcohol Use: No  . Drug Use: No  . Sexual Activity: Yes   Other Topics Concern  . Not on file   Social History Narrative   Patient is married. Patient has one grown son that lives in [place].   Patient with a history of smoking but quit approximately 10 years ago. Patient denies use of alcohol or other illicit drugs. Patient has never used smokeless tobacco.    No family history on file.  PHYSICAL EXAM: Filed Vitals:   04/28/13 1002  BP: 120/68  Pulse: 65   General:  Well appearing. No respiratory difficulty HEENT: normal Neck: supple. JVP 7-8 with prominent CV waves. Carotids 2+ bilat; no bruits. No lymphadenopathy or thryomegaly appreciated. Cor: PMI nondisplaced. Regular rate & rhythm. No rubs, gallops. MR 2/6 TR 3/6. Lungs: clear Abdomen: soft, nontender, nondistended. No hepatosplenomegaly. No bruits or masses. Good bowel sounds. Extremities: no cyanosis, clubbing, rash, edema Neuro: alert & oriented x 3, cranial nerves grossly intact. moves all 4 extremities w/o difficulty. Affect pleasant.    Results for orders placed during the hospital encounter of 04/27/13 (from the past 24 hour(s))  URINALYSIS, ROUTINE W REFLEX MICROSCOPIC     Status: None   Collection Time    04/27/13  3:09 PM      Result Value Range   Color, Urine YELLOW  YELLOW   APPearance CLEAR  CLEAR   Specific Gravity, Urine 1.015  1.005 - 1.030   pH 6.5  5.0 - 8.0   Glucose, UA NEGATIVE  NEGATIVE mg/dL   Hgb urine dipstick NEGATIVE  NEGATIVE    Bilirubin Urine NEGATIVE  NEGATIVE   Ketones, ur NEGATIVE  NEGATIVE mg/dL   Protein, ur NEGATIVE  NEGATIVE mg/dL   Urobilinogen, UA 1.0  0.0 - 1.0 mg/dL   Nitrite NEGATIVE  NEGATIVE   Leukocytes, UA NEGATIVE  NEGATIVE  SURGICAL PCR SCREEN     Status: None   Collection Time    04/27/13  3:10 PM      Result Value Range   MRSA, PCR NEGATIVE  NEGATIVE   Staphylococcus aureus NEGATIVE  NEGATIVE  APTT     Status: None   Collection Time    04/27/13  3:17 PM      Result Value Range   aPTT 29  24 - 37 seconds  BLOOD GAS, ARTERIAL     Status: Abnormal   Collection Time    04/27/13  3:17 PM      Result Value Range   FIO2 0.21     pH, Arterial 7.466 (*) 7.350 - 7.450   pCO2 arterial 35.2  35.0 - 45.0 mmHg   pO2, Arterial 101.0 (*) 80.0 - 100.0 mmHg   Bicarbonate 25.0 (*) 20.0 - 24.0 mEq/L   TCO2 26.1  0 - 100 mmol/L   Acid-Base Excess 1.6  0.0 - 2.0 mmol/L   O2 Saturation 98.9     Patient temperature 98.6     Collection site LEFT RADIAL     Drawn by [id number]     Sample type ARTERIAL DRAW     Allens test (pass/fail) PASS  PASS  CBC     Status: Abnormal   Collection Time    04/27/13  3:17 PM      Result Value Range   WBC 6.5  4.0 - 10.5 K/uL   RBC 4.61  4.22 - 5.81 MIL/uL   Hemoglobin 12.5 (*) 13.0 - 17.0 g/dL   HCT 04.5 (*) 40.9 - 81.1 %   MCV 82.0  78.0 - 100.0 fL   MCH 27.1  26.0 - 34.0 pg   MCHC 33.1  30.0 - 36.0 g/dL   RDW 91.4  78.2 - 95.6 %   Platelets 161  150 - 400 K/uL  COMPREHENSIVE METABOLIC PANEL     Status: Abnormal   Collection Time    04/27/13  3:17 PM      Result Value Range   Sodium 135  135 - 145  mEq/L   Potassium 4.2  3.5 - 5.1 mEq/L   Chloride 102  96 - 112 mEq/L   CO2 20  19 - 32 mEq/L   Glucose, Bld 111 (*) 70 - 99 mg/dL   BUN 20  6 - 23 mg/dL   Creatinine, Ser 1.61  0.50 - 1.35 mg/dL   Calcium 9.7  8.4 - 09.6 mg/dL   Total Protein 7.2  6.0 - 8.3 g/dL   Albumin 3.7  3.5 - 5.2 g/dL   AST 29  0 - 37 U/L   ALT 17  0 - 53 U/L   Alkaline Phosphatase 74   39 - 117 U/L   Total Bilirubin 0.5  0.3 - 1.2 mg/dL   GFR calc non Af Amer 59 (*) >90 mL/min   GFR calc Af Amer 68 (*) >90 mL/min  HEMOGLOBIN A1C     Status: None   Collection Time    04/27/13  3:17 PM      Result Value Range   Hemoglobin A1C 5.4  <5.7 %   Mean Plasma Glucose 108  <117 mg/dL  PROTIME-INR     Status: None   Collection Time    04/27/13  3:17 PM      Result Value Range   Prothrombin Time 13.9  11.6 - 15.2 seconds   INR 1.09  0.00 - 1.49  TYPE AND SCREEN     Status: None   Collection Time    04/27/13  3:20 PM      Result Value Range   ABO/RH(D) O POS     Antibody Screen NEG     Sample Expiration 04/30/2013    ABO/RH     Status: None   Collection Time    04/27/13  3:20 PM      Result Value Range   ABO/RH(D) O POS     Dg Chest 2 View  04/27/2013   *RADIOLOGY REPORT*  Clinical Data: 62 year old male preoperative study for mitral valve replacement.  Hypertension, CHF, pacemaker.  CHEST - 2 VIEW  Comparison: Chest CTA 04/23/2013 and earlier.  Findings: Larger lung volumes.  Cardiomegaly stable to decreased since 11/15/2012.  Three lead cardiac pacemaker appears stable.  No pneumothorax or pulmonary edema.  No pleural effusion or consolidation. No acute osseous abnormality identified.  IMPRESSION: Stable cardiomegaly.  No acute cardiopulmonary abnormality.   Original Report Authenticated By: Erskine Speed, M.D.     ASSESSMENT & PLAN:  1. Mitral Regurgitation  Plan for MVR TVR tomorrow by Dr Cornelius Moras  2. Tricuspid Valve Regurgitation Plan for  MVR TVR tomorrow by Dr Cornelius Moras  3. Chronic Systolic Heart Failure NYHA II. Volume status stable. Baseline weight at home 178-181 pounds. Continue current regimen.  Follow up in 2 months with Dr Gala Romney  Patient seen and examined with Tonye Becket, NP. We discussed all aspects of the encounter. I agree with the assessment and plan as stated above.  He is doing well. He has severe valvular disease plan for MVR/TVR tomorrow with Dr.  Cornelius Moras. We reviewed the procedure and reassured him that we will follow him post-op. HF is optimized.   Miyuki Rzasa,MD 10:10 PM

## 2013-04-28 NOTE — Patient Instructions (Addendum)
Follow up in 1 month with Dr Gala Romney

## 2013-04-28 NOTE — Progress Notes (Signed)
Pacer form placed in chart.

## 2013-04-29 ENCOUNTER — Encounter (HOSPITAL_COMMUNITY): Payer: Self-pay | Admitting: *Deleted

## 2013-04-29 ENCOUNTER — Encounter (HOSPITAL_COMMUNITY): Payer: Self-pay | Admitting: Anesthesiology

## 2013-04-29 ENCOUNTER — Encounter (HOSPITAL_COMMUNITY)
Admission: RE | Disposition: A | Discharge status: Home / Self Care | Payer: Self-pay | Source: Ambulatory Visit | Attending: Thoracic Surgery (Cardiothoracic Vascular Surgery)

## 2013-04-29 ENCOUNTER — Inpatient Hospital Stay (HOSPITAL_COMMUNITY)
Admission: RE | Admit: 2013-04-29 | Discharge: 2013-05-04 | DRG: 545 | Disposition: A | Discharge status: Home / Self Care | Payer: Federal, State, Local not specified - PPO | Source: Ambulatory Visit | Attending: Thoracic Surgery (Cardiothoracic Vascular Surgery) | Admitting: Thoracic Surgery (Cardiothoracic Vascular Surgery)

## 2013-04-29 ENCOUNTER — Inpatient Hospital Stay (HOSPITAL_COMMUNITY): Payer: Federal, State, Local not specified - PPO | Admitting: Anesthesiology

## 2013-04-29 ENCOUNTER — Inpatient Hospital Stay (HOSPITAL_COMMUNITY): Payer: Federal, State, Local not specified - PPO

## 2013-04-29 DIAGNOSIS — I5042 Chronic combined systolic (congestive) and diastolic (congestive) heart failure: Secondary | ICD-10-CM | POA: Diagnosis present

## 2013-04-29 DIAGNOSIS — Z87891 Personal history of nicotine dependence: Secondary | ICD-10-CM

## 2013-04-29 DIAGNOSIS — I059 Rheumatic mitral valve disease, unspecified: Secondary | ICD-10-CM

## 2013-04-29 DIAGNOSIS — I071 Rheumatic tricuspid insufficiency: Secondary | ICD-10-CM

## 2013-04-29 DIAGNOSIS — I079 Rheumatic tricuspid valve disease, unspecified: Secondary | ICD-10-CM

## 2013-04-29 DIAGNOSIS — Z9889 Other specified postprocedural states: Secondary | ICD-10-CM

## 2013-04-29 DIAGNOSIS — Z7901 Long term (current) use of anticoagulants: Secondary | ICD-10-CM

## 2013-04-29 DIAGNOSIS — I5022 Chronic systolic (congestive) heart failure: Secondary | ICD-10-CM

## 2013-04-29 DIAGNOSIS — I10 Essential (primary) hypertension: Secondary | ICD-10-CM | POA: Diagnosis present

## 2013-04-29 DIAGNOSIS — J9819 Other pulmonary collapse: Secondary | ICD-10-CM | POA: Diagnosis not present

## 2013-04-29 DIAGNOSIS — M109 Gout, unspecified: Secondary | ICD-10-CM | POA: Diagnosis present

## 2013-04-29 DIAGNOSIS — I509 Heart failure, unspecified: Secondary | ICD-10-CM | POA: Diagnosis present

## 2013-04-29 DIAGNOSIS — E785 Hyperlipidemia, unspecified: Secondary | ICD-10-CM | POA: Diagnosis present

## 2013-04-29 DIAGNOSIS — D62 Acute posthemorrhagic anemia: Secondary | ICD-10-CM | POA: Diagnosis not present

## 2013-04-29 DIAGNOSIS — Z79899 Other long term (current) drug therapy: Secondary | ICD-10-CM

## 2013-04-29 DIAGNOSIS — Z950 Presence of cardiac pacemaker: Secondary | ICD-10-CM

## 2013-04-29 DIAGNOSIS — I428 Other cardiomyopathies: Secondary | ICD-10-CM | POA: Diagnosis present

## 2013-04-29 DIAGNOSIS — Z7982 Long term (current) use of aspirin: Secondary | ICD-10-CM

## 2013-04-29 DIAGNOSIS — I442 Atrioventricular block, complete: Secondary | ICD-10-CM | POA: Diagnosis present

## 2013-04-29 DIAGNOSIS — D6959 Other secondary thrombocytopenia: Secondary | ICD-10-CM | POA: Diagnosis not present

## 2013-04-29 HISTORY — DX: Other specified postprocedural states: Z98.890

## 2013-04-29 HISTORY — PX: MITRAL VALVE REPAIR: SHX2039

## 2013-04-29 HISTORY — PX: MINIMALLY INVASIVE TRICUSPID VALVE REPAIR: SHX5975

## 2013-04-29 HISTORY — PX: INTRAOPERATIVE TRANSESOPHAGEAL ECHOCARDIOGRAM: SHX5062

## 2013-04-29 LAB — CBC
HCT: 33.5 % — ABNORMAL LOW (ref 39.0–52.0)
Platelets: 112 10*3/uL — ABNORMAL LOW (ref 150–400)
Platelets: 98 10*3/uL — ABNORMAL LOW (ref 150–400)
RBC: 4.11 MIL/uL — ABNORMAL LOW (ref 4.22–5.81)
RBC: 3.57 MIL/uL — ABNORMAL LOW (ref 4.22–5.81)
RDW: 14.2 % (ref 11.5–15.5)
WBC: 7.5 10*3/uL (ref 4.0–10.5)
WBC: 12.2 10*3/uL — ABNORMAL HIGH (ref 4.0–10.5)

## 2013-04-29 LAB — POCT I-STAT 3, ART BLOOD GAS (G3+)
Acid-Base Excess: 2 mmol/L (ref 0.0–2.0)
Acid-base deficit: 1 mmol/L (ref 0.0–2.0)
Acid-base deficit: 5 mmol/L — ABNORMAL HIGH (ref 0.0–2.0)
Bicarbonate: 27.2 mEq/L — ABNORMAL HIGH (ref 20.0–24.0)
Bicarbonate: 24 meq/L (ref 20.0–24.0)
Bicarbonate: 22.5 mEq/L (ref 20.0–24.0)
O2 Saturation: 100 %
O2 Saturation: 97 %
O2 Saturation: 94 %
Patient temperature: 97.5
Patient temperature: 98.5
TCO2: 25 mmol/L (ref 0–100)
TCO2: 24 mmol/L (ref 0–100)
pCO2 arterial: 57.7 mmHg (ref 35.0–45.0)
pCO2 arterial: 55.9 mmHg — ABNORMAL HIGH (ref 35.0–45.0)
pCO2 arterial: 40.3 mmHg (ref 35.0–45.0)
pCO2 arterial: 49.6 mmHg — ABNORMAL HIGH (ref 35.0–45.0)
pH, Arterial: 7.327 — ABNORMAL LOW (ref 7.350–7.450)
pH, Arterial: 7.28 — ABNORMAL LOW (ref 7.350–7.450)
pH, Arterial: 7.379 (ref 7.350–7.450)
pH, Arterial: 7.265 — ABNORMAL LOW (ref 7.350–7.450)
pO2, Arterial: 586 mmHg — ABNORMAL HIGH (ref 80.0–100.0)
pO2, Arterial: 406 mmHg — ABNORMAL HIGH (ref 80.0–100.0)
pO2, Arterial: 88 mmHg (ref 80.0–100.0)
pO2, Arterial: 81 mmHg (ref 80.0–100.0)

## 2013-04-29 LAB — POCT I-STAT 4, (NA,K, GLUC, HGB,HCT)
Glucose, Bld: 139 mg/dL — ABNORMAL HIGH (ref 70–99)
Glucose, Bld: 147 mg/dL — ABNORMAL HIGH (ref 70–99)
HCT: 38 % — ABNORMAL LOW (ref 39.0–52.0)
HCT: 28 % — ABNORMAL LOW (ref 39.0–52.0)
HCT: 32 % — ABNORMAL LOW (ref 39.0–52.0)
Hemoglobin: 12.9 g/dL — ABNORMAL LOW (ref 13.0–17.0)
Hemoglobin: 12.9 g/dL — ABNORMAL LOW (ref 13.0–17.0)
Hemoglobin: 9.5 g/dL — ABNORMAL LOW (ref 13.0–17.0)
Hemoglobin: 9.5 g/dL — ABNORMAL LOW (ref 13.0–17.0)
Potassium: 4.6 mEq/L (ref 3.5–5.1)
Potassium: 6 mEq/L — ABNORMAL HIGH (ref 3.5–5.1)
Sodium: 140 mEq/L (ref 135–145)
Sodium: 133 mEq/L — ABNORMAL LOW (ref 135–145)

## 2013-04-29 LAB — APTT: aPTT: 22 seconds — ABNORMAL LOW (ref 24–37)

## 2013-04-29 LAB — GLUCOSE, CAPILLARY

## 2013-04-29 LAB — PROTIME-INR: INR: 1.39 (ref 0.00–1.49)

## 2013-04-29 SURGERY — REPAIR, MITRAL VALVE, MINIMALLY INVASIVE
Anesthesia: General | Site: Chest | Laterality: Right | Wound class: Clean

## 2013-04-29 MED ORDER — MIDAZOLAM HCL 2 MG/2ML IJ SOLN
2.0000 mg | INTRAMUSCULAR | Status: DC | PRN
  Administered 2013-04-29 – 2013-04-30 (×2): 2 mg via INTRAVENOUS
  Filled 2013-04-29 (×2): qty 2

## 2013-04-29 MED ORDER — PHENYLEPHRINE HCL 10 MG/ML IJ SOLN
0.0000 ug/min | INTRAVENOUS | Status: DC
  Filled 2013-04-29: qty 2

## 2013-04-29 MED ORDER — ACETAMINOPHEN 500 MG PO TABS
1000.0000 mg | ORAL_TABLET | Freq: Four times a day (QID) | ORAL | Status: DC
  Administered 2013-04-30 – 2013-05-04 (×15): 1000 mg via ORAL
  Filled 2013-04-29 (×19): qty 2

## 2013-04-29 MED ORDER — 0.9 % SODIUM CHLORIDE (POUR BTL) OPTIME
TOPICAL | Status: DC | PRN
  Administered 2013-04-29: 1000 mL

## 2013-04-29 MED ORDER — ACETAMINOPHEN 160 MG/5ML PO SOLN
1000.0000 mg | Freq: Four times a day (QID) | ORAL | Status: DC
  Administered 2013-04-29: 1000 mg
  Filled 2013-04-29: qty 40.6

## 2013-04-29 MED ORDER — ARTIFICIAL TEARS OP OINT
TOPICAL_OINTMENT | OPHTHALMIC | Status: DC | PRN
  Administered 2013-04-29: 1 via OPHTHALMIC

## 2013-04-29 MED ORDER — MORPHINE SULFATE 2 MG/ML IJ SOLN
2.0000 mg | INTRAMUSCULAR | Status: DC | PRN
  Administered 2013-04-29 – 2013-04-30 (×2): 4 mg via INTRAVENOUS
  Filled 2013-04-29 (×2): qty 2

## 2013-04-29 MED ORDER — VECURONIUM BROMIDE 10 MG IV SOLR
INTRAVENOUS | Status: DC | PRN
  Administered 2013-04-29: 6 mg via INTRAVENOUS
  Administered 2013-04-29: 3 mg via INTRAVENOUS
  Administered 2013-04-29: 4 mg via INTRAVENOUS
  Administered 2013-04-29: 5 mg via INTRAVENOUS
  Administered 2013-04-29: 2 mg via INTRAVENOUS

## 2013-04-29 MED ORDER — METOPROLOL TARTRATE 12.5 MG HALF TABLET
12.5000 mg | ORAL_TABLET | Freq: Two times a day (BID) | ORAL | Status: DC
  Filled 2013-04-29 (×3): qty 1

## 2013-04-29 MED ORDER — SODIUM CHLORIDE 0.9 % IR SOLN
Status: DC | PRN
  Administered 2013-04-29: 3000 mL

## 2013-04-29 MED ORDER — NITROPRUSSIDE SODIUM 25 MG/ML IV SOLN
0.2500 ug/kg/min | INTRAVENOUS | Status: DC
  Administered 2013-04-29: 0.25 ug/kg/min via INTRAVENOUS
  Filled 2013-04-29 (×3): qty 2

## 2013-04-29 MED ORDER — PROTAMINE SULFATE 10 MG/ML IV SOLN
INTRAVENOUS | Status: DC | PRN
  Administered 2013-04-29 (×5): 50 mg via INTRAVENOUS

## 2013-04-29 MED ORDER — SODIUM CHLORIDE 0.9 % IV SOLN
INTRAVENOUS | Status: DC | PRN
  Administered 2013-04-29: 09:00:00 via INTRAVENOUS

## 2013-04-29 MED ORDER — DOCUSATE SODIUM 100 MG PO CAPS
200.0000 mg | ORAL_CAPSULE | Freq: Every day | ORAL | Status: DC
  Administered 2013-04-30 – 2013-05-04 (×4): 200 mg via ORAL
  Filled 2013-04-29 (×5): qty 2

## 2013-04-29 MED ORDER — BUPIVACAINE 0.5 % ON-Q PUMP SINGLE CATH 400 ML
400.0000 mL | INJECTION | Status: DC
  Filled 2013-04-29: qty 400

## 2013-04-29 MED ORDER — VANCOMYCIN HCL IN DEXTROSE 1-5 GM/200ML-% IV SOLN
1000.0000 mg | Freq: Once | INTRAVENOUS | Status: AC
  Administered 2013-04-29: 1000 mg via INTRAVENOUS
  Filled 2013-04-29: qty 200

## 2013-04-29 MED ORDER — LACTATED RINGERS IV SOLN
INTRAVENOUS | Status: DC | PRN
  Administered 2013-04-29: 08:00:00 via INTRAVENOUS

## 2013-04-29 MED ORDER — OXYCODONE HCL 5 MG PO TABS: 5.0000 mg | ORAL_TABLET | ORAL | Status: DC | PRN

## 2013-04-29 MED ORDER — DEXTROSE 5 % IV SOLN
1.5000 g | Freq: Two times a day (BID) | INTRAVENOUS | Status: AC
  Administered 2013-04-29 – 2013-05-01 (×4): 1.5 g via INTRAVENOUS
  Filled 2013-04-29 (×4): qty 1.5

## 2013-04-29 MED ORDER — ASPIRIN 81 MG PO CHEW: 324.0000 mg | CHEWABLE_TABLET | Freq: Every day | ORAL | Status: DC

## 2013-04-29 MED ORDER — FAMOTIDINE IN NACL 20-0.9 MG/50ML-% IV SOLN
20.0000 mg | Freq: Two times a day (BID) | INTRAVENOUS | Status: AC
  Administered 2013-04-29 – 2013-04-30 (×2): 20 mg via INTRAVENOUS
  Filled 2013-04-29 (×2): qty 50

## 2013-04-29 MED ORDER — METOPROLOL TARTRATE 25 MG/10 ML ORAL SUSPENSION
12.5000 mg | Freq: Two times a day (BID) | ORAL | Status: DC
  Administered 2013-04-29: 12.5 mg
  Filled 2013-04-29 (×3): qty 5

## 2013-04-29 MED ORDER — SODIUM CHLORIDE 0.9 % IV SOLN
INTRAVENOUS | Status: DC
  Filled 2013-04-29: qty 1

## 2013-04-29 MED ORDER — BISACODYL 5 MG PO TBEC
10.0000 mg | DELAYED_RELEASE_TABLET | Freq: Every day | ORAL | Status: DC
  Administered 2013-04-30 – 2013-05-04 (×4): 10 mg via ORAL
  Filled 2013-04-29 (×3): qty 2

## 2013-04-29 MED ORDER — DOPAMINE-DEXTROSE 3.2-5 MG/ML-% IV SOLN: 0.0000 ug/kg/min | INTRAVENOUS | Status: DC

## 2013-04-29 MED ORDER — ROCURONIUM BROMIDE 100 MG/10ML IV SOLN
INTRAVENOUS | Status: DC | PRN
  Administered 2013-04-29 (×2): 50 mg via INTRAVENOUS

## 2013-04-29 MED ORDER — MILRINONE IN DEXTROSE 20 MG/100ML IV SOLN
0.1250 ug/kg/min | INTRAVENOUS | Status: DC
  Administered 2013-04-29: .3 ug/kg/min via INTRAVENOUS
  Filled 2013-04-29: qty 100

## 2013-04-29 MED ORDER — INSULIN REGULAR BOLUS VIA INFUSION
0.0000 [IU] | Freq: Three times a day (TID) | INTRAVENOUS | Status: DC
  Filled 2013-04-29: qty 10

## 2013-04-29 MED ORDER — NITROGLYCERIN IN D5W 200-5 MCG/ML-% IV SOLN
0.0000 ug/min | INTRAVENOUS | Status: DC
  Administered 2013-04-29: 100 ug/min via INTRAVENOUS
  Administered 2013-04-30: 20 ug/min via INTRAVENOUS
  Filled 2013-04-29 (×2): qty 250

## 2013-04-29 MED ORDER — BISACODYL 10 MG RE SUPP: 10.0000 mg | Freq: Every day | RECTAL | Status: DC

## 2013-04-29 MED ORDER — SODIUM CHLORIDE 0.9 % IJ SOLN: 3.0000 mL | INTRAMUSCULAR | Status: DC | PRN

## 2013-04-29 MED ORDER — ACETAMINOPHEN 650 MG RE SUPP
650.0000 mg | Freq: Once | RECTAL | Status: AC
  Administered 2013-04-29: 650 mg via RECTAL

## 2013-04-29 MED ORDER — PANTOPRAZOLE SODIUM 40 MG PO TBEC
40.0000 mg | DELAYED_RELEASE_TABLET | Freq: Every day | ORAL | Status: DC
  Administered 2013-05-01 – 2013-05-04 (×3): 40 mg via ORAL
  Filled 2013-04-29 (×3): qty 1

## 2013-04-29 MED ORDER — ONDANSETRON HCL 4 MG/2ML IJ SOLN: 4.0000 mg | Freq: Four times a day (QID) | INTRAMUSCULAR | Status: DC | PRN

## 2013-04-29 MED ORDER — SODIUM CHLORIDE 0.9 % IJ SOLN
3.0000 mL | Freq: Two times a day (BID) | INTRAMUSCULAR | Status: DC
  Administered 2013-04-30 – 2013-05-03 (×5): 3 mL via INTRAVENOUS

## 2013-04-29 MED ORDER — ACETAMINOPHEN 160 MG/5ML PO SOLN: 650.0000 mg | Freq: Once | ORAL | Status: AC

## 2013-04-29 MED ORDER — PHENYLEPHRINE HCL 10 MG/ML IJ SOLN
30.0000 ug/min | INTRAVENOUS | Status: DC
  Filled 2013-04-29: qty 2

## 2013-04-29 MED ORDER — LACTATED RINGERS IV SOLN: INTRAVENOUS | Status: DC

## 2013-04-29 MED ORDER — FENTANYL CITRATE 0.05 MG/ML IJ SOLN
INTRAMUSCULAR | Status: DC | PRN
  Administered 2013-04-29 (×3): 250 ug via INTRAVENOUS
  Administered 2013-04-29 (×2): 50 ug via INTRAVENOUS
  Administered 2013-04-29 (×2): 250 ug via INTRAVENOUS
  Administered 2013-04-29: 100 ug via INTRAVENOUS
  Administered 2013-04-29: 50 ug via INTRAVENOUS
  Administered 2013-04-29: 250 ug via INTRAVENOUS
  Administered 2013-04-29 (×2): 125 ug via INTRAVENOUS
  Administered 2013-04-29: 250 ug via INTRAVENOUS

## 2013-04-29 MED ORDER — POTASSIUM CHLORIDE 10 MEQ/50ML IV SOLN: 10.0000 meq | INTRAVENOUS | Status: AC

## 2013-04-29 MED ORDER — SODIUM CHLORIDE 0.9 % IV SOLN: INTRAVENOUS | Status: DC

## 2013-04-29 MED ORDER — LACTATED RINGERS IV SOLN
500.0000 mL | Freq: Once | INTRAVENOUS | Status: AC | PRN
  Administered 2013-04-29: 500 mL via INTRAVENOUS

## 2013-04-29 MED ORDER — MORPHINE SULFATE 2 MG/ML IJ SOLN
1.0000 mg | INTRAMUSCULAR | Status: AC | PRN
  Administered 2013-04-29: 2 mg via INTRAVENOUS
  Filled 2013-04-29: qty 1

## 2013-04-29 MED ORDER — SODIUM CHLORIDE 0.9 % IV SOLN
INTRAVENOUS | Status: DC
  Filled 2013-04-29: qty 40

## 2013-04-29 MED ORDER — ASPIRIN EC 325 MG PO TBEC
325.0000 mg | DELAYED_RELEASE_TABLET | Freq: Every day | ORAL | Status: DC
  Filled 2013-04-29: qty 1

## 2013-04-29 MED ORDER — METOPROLOL TARTRATE 1 MG/ML IV SOLN
2.5000 mg | INTRAVENOUS | Status: DC | PRN
  Administered 2013-04-29 (×2): 5 mg via INTRAVENOUS

## 2013-04-29 MED ORDER — LABETALOL HCL 5 MG/ML IV SOLN
10.0000 mg | INTRAVENOUS | Status: DC | PRN
  Administered 2013-04-29 – 2013-04-30 (×9): 10 mg via INTRAVENOUS
  Filled 2013-04-29 (×6): qty 4

## 2013-04-29 MED ORDER — DEXMEDETOMIDINE HCL IN NACL 200 MCG/50ML IV SOLN
0.1000 ug/kg/h | INTRAVENOUS | Status: DC
  Administered 2013-04-30: 0.2 ug/kg/h via INTRAVENOUS
  Filled 2013-04-29: qty 50

## 2013-04-29 MED ORDER — ALBUMIN HUMAN 5 % IV SOLN
250.0000 mL | INTRAVENOUS | Status: AC | PRN
  Administered 2013-04-29 (×4): 250 mL via INTRAVENOUS
  Filled 2013-04-29: qty 500

## 2013-04-29 MED ORDER — LACTATED RINGERS IV SOLN
INTRAVENOUS | Status: DC | PRN
  Administered 2013-04-29 (×2): via INTRAVENOUS

## 2013-04-29 MED ORDER — HEPARIN SODIUM (PORCINE) 1000 UNIT/ML IJ SOLN
INTRAMUSCULAR | Status: DC | PRN
  Administered 2013-04-29: 23000 [IU] via INTRAVENOUS

## 2013-04-29 MED ORDER — SODIUM CHLORIDE 0.9 % IV SOLN: INTRAVENOUS | Status: DC | PRN

## 2013-04-29 MED ORDER — SODIUM CHLORIDE 0.9 % IV SOLN: 250.0000 mL | INTRAVENOUS | Status: DC

## 2013-04-29 MED ORDER — MILRINONE IN DEXTROSE 20 MG/100ML IV SOLN
0.0000 ug/kg/min | INTRAVENOUS | Status: DC
  Administered 2013-04-29 – 2013-04-30 (×2): 0.3 ug/kg/min via INTRAVENOUS
  Filled 2013-04-29 (×2): qty 100

## 2013-04-29 MED ORDER — PROPOFOL 10 MG/ML IV BOLUS
INTRAVENOUS | Status: DC | PRN
  Administered 2013-04-29: 80 mg via INTRAVENOUS

## 2013-04-29 MED ORDER — MAGNESIUM SULFATE 40 MG/ML IJ SOLN
4.0000 g | Freq: Once | INTRAMUSCULAR | Status: AC
  Administered 2013-04-29: 4 g via INTRAVENOUS
  Filled 2013-04-29: qty 100

## 2013-04-29 MED ORDER — LIDOCAINE HCL (CARDIAC) 20 MG/ML IV SOLN
INTRAVENOUS | Status: DC | PRN
  Administered 2013-04-29: 40 mg via INTRAVENOUS

## 2013-04-29 MED ORDER — SODIUM CHLORIDE 0.45 % IV SOLN
INTRAVENOUS | Status: DC
  Administered 2013-04-29: 17:00:00 via INTRAVENOUS

## 2013-04-29 MED ORDER — INSULIN ASPART 100 UNIT/ML ~~LOC~~ SOLN
0.0000 [IU] | SUBCUTANEOUS | Status: DC
  Administered 2013-04-30: 2 [IU] via SUBCUTANEOUS

## 2013-04-29 MED ORDER — MIDAZOLAM HCL 5 MG/5ML IJ SOLN
INTRAMUSCULAR | Status: DC | PRN
  Administered 2013-04-29 (×2): 4 mg via INTRAVENOUS
  Administered 2013-04-29 (×3): 2 mg via INTRAVENOUS

## 2013-04-29 MED ORDER — DEXMEDETOMIDINE HCL IN NACL 400 MCG/100ML IV SOLN
0.4000 ug/kg/h | INTRAVENOUS | Status: DC
  Filled 2013-04-29: qty 100

## 2013-04-29 SURGICAL SUPPLY — 125 items
ADAPTER CARDIO PERF ANTE/RETRO (ADAPTER) ×3 IMPLANT
ATTRACTOMAT 16X20 MAGNETIC DRP (DRAPES) ×3 IMPLANT
BAG DECANTER FOR FLEXI CONT (MISCELLANEOUS) ×3 IMPLANT
BENZOIN TINCTURE PRP APPL 2/3 (GAUZE/BANDAGES/DRESSINGS) ×3 IMPLANT
BLADE SURG 11 STRL SS (BLADE) ×3 IMPLANT
CANISTER SUCTION 2500CC (MISCELLANEOUS) ×6 IMPLANT
CANNULA FEM VENOUS REMOTE 22FR (CANNULA) ×3 IMPLANT
CANNULA FEMORAL ART 14 SM (MISCELLANEOUS) ×3 IMPLANT
CANNULA GUNDRY RCSP 15FR (MISCELLANEOUS) ×3 IMPLANT
CANNULA OPTISITE PERFUSION 16F (CANNULA) IMPLANT
CANNULA OPTISITE PERFUSION 18F (CANNULA) ×3 IMPLANT
CARDIAC SUCTION (MISCELLANEOUS) ×3 IMPLANT
CATH KIT ON Q 5IN SLV (PAIN MANAGEMENT) IMPLANT
CATH ROBINSON RED A/P 18FR (CATHETERS) ×3 IMPLANT
CLOTH BEACON ORANGE TIMEOUT ST (SAFETY) ×3 IMPLANT
CONN ST 1/4X3/8  BEN (MISCELLANEOUS) ×2
CONN ST 1/4X3/8 BEN (MISCELLANEOUS) ×4 IMPLANT
CONT SPEC STER OR (MISCELLANEOUS) ×3 IMPLANT
COVER MAYO STAND STRL (DRAPES) ×3 IMPLANT
COVER SURGICAL LIGHT HANDLE (MISCELLANEOUS) ×6 IMPLANT
CRADLE DONUT ADULT HEAD (MISCELLANEOUS) ×3 IMPLANT
DERMABOND ADVANCED (GAUZE/BANDAGES/DRESSINGS) ×2
DERMABOND ADVANCED .7 DNX12 (GAUZE/BANDAGES/DRESSINGS) ×4 IMPLANT
DEVICE PMI PUNCTURE CLOSURE (MISCELLANEOUS) IMPLANT
DEVICE TROCAR PUNCTURE CLOSURE (ENDOMECHANICALS) ×3 IMPLANT
DRAIN CHANNEL 28F RND 3/8 FF (WOUND CARE) ×6 IMPLANT
DRAPE BILATERAL SPLIT (DRAPES) ×3 IMPLANT
DRAPE C-ARM 42X72 X-RAY (DRAPES) ×3 IMPLANT
DRAPE CV SPLIT W-CLR ANES SCRN (DRAPES) ×3 IMPLANT
DRAPE INCISE IOBAN 66X45 STRL (DRAPES) ×6 IMPLANT
DRAPE SLUSH/WARMER DISC (DRAPES) ×3 IMPLANT
DRSG COVADERM 4X8 (GAUZE/BANDAGES/DRESSINGS) ×3 IMPLANT
ELECT BLADE 6.5 EXT (BLADE) ×3 IMPLANT
ELECT REM PT RETURN 9FT ADLT (ELECTROSURGICAL) ×6
ELECTRODE REM PT RTRN 9FT ADLT (ELECTROSURGICAL) ×4 IMPLANT
FEMORAL VENOUS CANN RAP (CANNULA) ×3 IMPLANT
GLOVE BIO SURGEON STRL SZ 6 (GLOVE) ×12 IMPLANT
GLOVE BIO SURGEON STRL SZ 6.5 (GLOVE) ×12 IMPLANT
GLOVE BIOGEL PI IND STRL 6 (GLOVE) ×8 IMPLANT
GLOVE BIOGEL PI IND STRL 6.5 (GLOVE) ×8 IMPLANT
GLOVE BIOGEL PI IND STRL 7.0 (GLOVE) ×8 IMPLANT
GLOVE BIOGEL PI INDICATOR 6 (GLOVE) ×4
GLOVE BIOGEL PI INDICATOR 6.5 (GLOVE) ×4
GLOVE BIOGEL PI INDICATOR 7.0 (GLOVE) ×4
GLOVE ORTHO TXT STRL SZ7.5 (GLOVE) ×9 IMPLANT
GOWN PREVENTION PLUS XLARGE (GOWN DISPOSABLE) ×3 IMPLANT
GOWN STRL NON-REIN LRG LVL3 (GOWN DISPOSABLE) ×12 IMPLANT
GUIDEWIRE ANG ZIPWIRE 038X150 (WIRE) ×3 IMPLANT
INSERT CONFORM CROSS CLAMP 66M (MISCELLANEOUS) IMPLANT
INSERT CONFORM CROSS CLAMP 86M (MISCELLANEOUS) IMPLANT
KIT BASIN OR (CUSTOM PROCEDURE TRAY) ×3 IMPLANT
KIT DILATOR VASC 18G NDL (KITS) ×3 IMPLANT
KIT DRAINAGE VACCUM ASSIST (KITS) ×3 IMPLANT
KIT ROOM TURNOVER OR (KITS) ×3 IMPLANT
KIT SUCTION CATH 14FR (SUCTIONS) ×3 IMPLANT
LEAD PACING MYOCARDI (MISCELLANEOUS) ×3 IMPLANT
LINE VENT (MISCELLANEOUS) ×3 IMPLANT
NEEDLE AORTIC ROOT 14G 7F (CATHETERS) ×3 IMPLANT
NS IRRIG 1000ML POUR BTL (IV SOLUTION) ×15 IMPLANT
PACK OPEN HEART (CUSTOM PROCEDURE TRAY) ×3 IMPLANT
PAD ARMBOARD 7.5X6 YLW CONV (MISCELLANEOUS) ×6 IMPLANT
PAD ELECT DEFIB RADIOL ZOLL (MISCELLANEOUS) ×3 IMPLANT
PATCH CORMATRIX 4CMX7CM (Prosthesis & Implant Heart) ×3 IMPLANT
RETRACTOR PVM SOFT TISSUE M (INSTRUMENTS) ×3 IMPLANT
RETRACTOR TRL SOFT TISSUE LG (INSTRUMENTS) ×3 IMPLANT
RETRACTOR TRM SOFT TISSUE 7.5 (INSTRUMENTS) IMPLANT
RING MITRAL MEMO 3D 28MM SMD28 (Prosthesis & Implant Heart) ×3 IMPLANT
RING TRICUSPID T28 (Prosthesis & Implant Heart) ×3 IMPLANT
SET CANNULATION TOURNIQUET (MISCELLANEOUS) ×3 IMPLANT
SET CARDIOPLEGIA MPS 5001102 (MISCELLANEOUS) ×3 IMPLANT
SET IRRIG TUBING LAPAROSCOPIC (IRRIGATION / IRRIGATOR) ×3 IMPLANT
SOLUTION ANTI FOG 6CC (MISCELLANEOUS) ×3 IMPLANT
SPONGE GAUZE 4X4 12PLY (GAUZE/BANDAGES/DRESSINGS) ×3 IMPLANT
SUCKER WEIGHTED FLEX (MISCELLANEOUS) ×6 IMPLANT
SUT BONE WAX W31G (SUTURE) ×6 IMPLANT
SUT E-PACK MINIMALLY INVASIVE (SUTURE) IMPLANT
SUT ETHIBOND (SUTURE) ×9 IMPLANT
SUT ETHIBOND 2 0 SH (SUTURE) ×3 IMPLANT
SUT ETHIBOND 2 0 V4 (SUTURE) IMPLANT
SUT ETHIBOND 2 0V4 GREEN (SUTURE) IMPLANT
SUT ETHIBOND 2-0 RB-1 WHT (SUTURE) ×6 IMPLANT
SUT ETHIBOND 4 0 TF (SUTURE) IMPLANT
SUT ETHIBOND 5 0 C 1 30 (SUTURE) IMPLANT
SUT ETHIBOND NAB MH 2-0 36IN (SUTURE) ×3 IMPLANT
SUT ETHIBOND X763 2 0 SH 1 (SUTURE) ×3 IMPLANT
SUT GORETEX 6.0 TH-9 30 IN (SUTURE) IMPLANT
SUT GORETEX CV 4 TH 22 36 (SUTURE) ×3 IMPLANT
SUT GORETEX CV-5THC-13 36IN (SUTURE) IMPLANT
SUT GORETEX CV4 TH-18 (SUTURE) ×6 IMPLANT
SUT GORETEX TH-18 36 INCH (SUTURE) IMPLANT
SUT MNCRL AB 3-0 PS2 18 (SUTURE) ×6 IMPLANT
SUT PROLENE 3 0 SH DA (SUTURE) ×15 IMPLANT
SUT PROLENE 3 0 SH1 36 (SUTURE) ×21 IMPLANT
SUT PROLENE 4 0 RB 1 (SUTURE) ×1
SUT PROLENE 4-0 RB1 .5 CRCL 36 (SUTURE) ×2 IMPLANT
SUT PROLENE 5 0 C 1 36 (SUTURE) ×6 IMPLANT
SUT PROLENE 6 0 C 1 30 (SUTURE) ×6 IMPLANT
SUT SILK  1 MH (SUTURE) ×9
SUT SILK 1 MH (SUTURE) ×18 IMPLANT
SUT SILK 1 TIES 10X30 (SUTURE) ×3 IMPLANT
SUT SILK 2 0 SH CR/8 (SUTURE) ×3 IMPLANT
SUT SILK 2 0 TIES 10X30 (SUTURE) ×3 IMPLANT
SUT SILK 2 0SH CR/8 30 (SUTURE) ×6 IMPLANT
SUT SILK 3 0 (SUTURE) ×1
SUT SILK 3 0 SH CR/8 (SUTURE) ×3 IMPLANT
SUT SILK 3 0SH CR/8 30 (SUTURE) ×3 IMPLANT
SUT SILK 3-0 18XBRD TIE 12 (SUTURE) ×2 IMPLANT
SUT TEM PAC WIRE 2 0 SH (SUTURE) ×6 IMPLANT
SUT VIC AB 2-0 CTX 36 (SUTURE) ×6 IMPLANT
SUT VIC AB 2-0 UR6 27 (SUTURE) ×6 IMPLANT
SUT VIC AB 3-0 SH 8-18 (SUTURE) ×9 IMPLANT
SUT VICRYL 2 TP 1 (SUTURE) ×3 IMPLANT
SYRINGE 10CC LL (SYRINGE) ×3 IMPLANT
SYSTEM SAHARA CHEST DRAIN ATS (WOUND CARE) ×3 IMPLANT
TAPE CLOTH SURG 4X10 WHT LF (GAUZE/BANDAGES/DRESSINGS) ×3 IMPLANT
TOWEL OR 17X24 6PK STRL BLUE (TOWEL DISPOSABLE) ×3 IMPLANT
TOWEL OR 17X26 10 PK STRL BLUE (TOWEL DISPOSABLE) ×3 IMPLANT
TRAY FOLEY IC TEMP SENS 14FR (CATHETERS) ×3 IMPLANT
TROCAR XCEL BLADELESS 5X75MML (TROCAR) ×3 IMPLANT
TROCAR XCEL NON-BLD 11X100MML (ENDOMECHANICALS) ×6 IMPLANT
TUBE SUCT INTRACARD DLP 20F (MISCELLANEOUS) ×3 IMPLANT
TUNNELER SHEATH ON-Q 11GX8 (MISCELLANEOUS) IMPLANT
UNDERPAD 30X30 INCONTINENT (UNDERPADS AND DIAPERS) ×3 IMPLANT
WATER STERILE IRR 1000ML POUR (IV SOLUTION) ×6 IMPLANT
WIRE BENTSON .035X145CM (WIRE) ×3 IMPLANT

## 2013-04-29 NOTE — Progress Notes (Signed)
Russell Martin noted to be at 57 with dampened waveform.  MD Cornelius Moras at bedside to assess.  Repositioned swan; unable to float.  Swan placed in right atrium per MD Cornelius Moras.  Per MD Cornelius Moras, stop CO and CI monitoring d/t inaccuracy.  Monitor CVP, maintaining goal CVP of 8-10.  Give 4 albumins, total.  Will continue to monitor.

## 2013-04-29 NOTE — Anesthesia Postprocedure Evaluation (Signed)
  Anesthesia Post-op Note  Patient: Russell Martin  Procedure(s) Performed: Procedure(s): MINIMALLY INVASIVE MITRAL VALVE REPAIR (MVR) (Right) MINIMALLY INVASIVE TRICUSPID VALVE REPAIR (Right) INTRAOPERATIVE TRANSESOPHAGEAL ECHOCARDIOGRAM (N/A)  Patient Location: SICU  Anesthesia Type:General  Level of Consciousness: sedated and unresponsive  Airway and Oxygen Therapy: Patient remains intubated per anesthesia plan  Post-op Pain: none  Post-op Assessment: Post-op Vital signs reviewed, Patient's Cardiovascular Status Stable and Respiratory Function Stable  Post-op Vital Signs: Reviewed and stable  Complications: No apparent anesthesia complications

## 2013-04-29 NOTE — Progress Notes (Signed)
Per cath lab, [employer] representative to see pt tomorr, 04/30/2013, to reprogram PPM.

## 2013-04-29 NOTE — Preoperative (Signed)
Beta Blockers   Reason not to administer Beta Blockers:Coreg this am 04/29/13

## 2013-04-29 NOTE — Progress Notes (Signed)
Patient ID: Russell Martin, male   DOB: [birth year], 62 y.o.   MRN: [id number] EVENING ROUNDS NOTE :     [address]       [name] [address]             [phone]                 Day of Surgery Procedure(s) (LRB): MINIMALLY INVASIVE MITRAL VALVE REPAIR (MVR) (Right) MINIMALLY INVASIVE TRICUSPID VALVE REPAIR (Right) INTRAOPERATIVE TRANSESOPHAGEAL ECHOCARDIOGRAM (N/A)  Total Length of Stay:  LOS: 0 days  BP 103/62  Pulse 79  Temp(Src) 97.5 F (36.4 C) (Oral)  Resp 16  Wt 183 lb (83.008 kg)  BMI 26.26 kg/m2  SpO2 100%  .Intake/Output     08/20 0701 - 08/21 0700   I.V. (mL/kg) 4756.7 (57.3)   Blood 400   NG/GT 30   IV Piggyback 1110   Total Intake(mL/kg) 6296.7 (75.9)   Urine (mL/kg/hr) 3200 (3)   Blood 800 (0.7)   Chest Tube 160 (0.1)   Total Output 4160   Net +2136.7         . sodium chloride 20 mL/hr at 04/29/13 1630  . sodium chloride 20 mL/hr at 04/29/13 1630  . [START ON 04/30/2013] sodium chloride    . dexmedetomidine Stopped (04/29/13 1845)  . DOPamine 3 mcg/kg/min (04/29/13 1630)  . insulin (NOVOLIN-R) infusion 0.4 Units/hr (04/29/13 1900)  . lactated ringers 20 mL/hr at 04/29/13 1630  . milrinone 0.3 mcg/kg/min (04/29/13 1630)  . nitroGLYCERIN 70 mcg/min (04/29/13 1930)  . phenylephrine (NEO-SYNEPHRINE) Adult infusion Stopped (04/29/13 1815)     Lab Results  Component Value Date   WBC 7.5 04/29/2013   HGB 11.3* 04/29/2013   HCT 33.5* 04/29/2013   PLT 112* 04/29/2013   GLUCOSE 136* 04/29/2013   ALT 17 04/27/2013   AST 29 04/27/2013   NA 138 04/29/2013   K 4.1 04/29/2013   CL 102 04/27/2013   CREATININE 1.29 04/27/2013   BUN 20 04/27/2013   CO2 20 04/27/2013   INR 1.39 04/29/2013   HGBA1C 5.4 04/27/2013   Sedated on vent still Hemodynamics stable Chest tube output low  Delight Ovens MD  Beeper [phone] Office [phone] 04/29/2013 7:56 PM

## 2013-04-29 NOTE — Brief Op Note (Addendum)
      [address]       [name] [address]             [phone]     04/29/2013  1:38 PM  PATIENT:  Russell Martin  62 y.o. male  PRE-OPERATIVE DIAGNOSIS:  MR TR  POST-OPERATIVE DIAGNOSIS:  MR TR  PROCEDURE:  Procedure(s): MINIMALLY INVASIVE MITRAL VALVE REPAIR (MVR)# 28 MEMO 3D RING ANNULOPLASTY MINIMALLY INVASIVE TRICUSPID VALVE REPAIR # 28 MC3 RING ANNULOPLASTY INTRAOPERATIVE TRANSESOPHAGEAL ECHOCARDIOGRAM  SURGEON:    [name], MD  ASSISTANTS:  Rowe Clack, PA-C  ANESTHESIA:   Rosezella Florida, MD  CROSSCLAMP TIME:   111'  CARDIOPULMONARY BYPASS TIME: 167'  FINDINGS:  Dilated non-ischemic cardiomyopathy  Type I mitral valve dysfunction with severe (4+) mitral regurgitation  Type I tricuspid valve dysfunction with moderate (3+) tricuspid regurgitation  No residual mitral regurgitation following successful valve repair  Trace residual tricuspid regurgitation following successful valve repair  COMPLICATIONS: none  PRE-OPERATIVE WEIGHT: 83kg  PATIENT DISPOSITION:   TO SICU IN STABLE CONDITION  [name] H 04/29/2013 3:16 PM

## 2013-04-29 NOTE — Anesthesia Preprocedure Evaluation (Addendum)
Anesthesia Evaluation  Patient identified by MRN, date of birth, ID band Patient awake    Reviewed: Allergy & Precautions, H&P , NPO status , Patient's Chart, lab work & pertinent test results, reviewed documented beta blocker date and time   Airway Mallampati: I TM Distance: >3 FB Neck ROM: Full    Dental no notable dental hx. (+) Edentulous Upper, Edentulous Lower and Dental Advisory Given   Pulmonary neg pulmonary ROS,  breath sounds clear to auscultation  Pulmonary exam normal       Cardiovascular hypertension, On Medications and On Home Beta Blockers +CHF + dysrhythmias + pacemaker Rhythm:Regular Rate:Normal + Systolic murmurs    Neuro/Psych negative neurological ROS  negative psych ROS   GI/Hepatic negative GI ROS, Neg liver ROS,   Endo/Other  negative endocrine ROS  Renal/GU negative Renal ROS  negative genitourinary   Musculoskeletal   Abdominal   Peds  Hematology negative hematology ROS (+)   Anesthesia Other Findings   Reproductive/Obstetrics negative OB ROS                           Anesthesia Physical Anesthesia Plan  ASA: IV  Anesthesia Plan: General   Post-op Pain Management:    Induction: Intravenous  Airway Management Planned: Oral ETT  Additional Equipment: Arterial line, CVP, PA Cath, 3D TEE and Ultrasound Guidance Line Placement  Intra-op Plan:   Post-operative Plan: Post-operative intubation/ventilation  Informed Consent: I have reviewed the patients History and Physical, chart, labs and discussed the procedure including the risks, benefits and alternatives for the proposed anesthesia with the patient or authorized representative who has indicated his/her understanding and acceptance.   Dental advisory given  Plan Discussed with: CRNA  Anesthesia Plan Comments:         Anesthesia Quick Evaluation

## 2013-04-29 NOTE — OR Nursing (Signed)
First call to SICU. Wynona Canes)

## 2013-04-29 NOTE — Op Note (Addendum)
CARDIOTHORACIC SURGERY OPERATIVE NOTE  Date of Procedure:  04/29/2013  Preoperative Diagnosis:   Severe Mitral Regurgitation  Moderate Tricuspid Regurgitation  Postoperative Diagnosis: Same  Procedure:    Minimally-Invasive Mitral Valve Repair   Sorin Memo 3D Ring Annuloplasty (size 28mm, catalog # [id number], serial # [id number])  Minimally-Invasive Tricuspid Valve Repair   Edwards mc3 Ring Annuloplasty (size 28mm, model # [id number], serial # [id number])   Surgeon: Salvatore Decent. Cornelius Moras, MD  Assistant: Rowe Clack, PA-C  Anesthesia: Rosezella Florida, MD  Operative Findings: Dilated non-ischemic cardiomyopathy  Type I mitral valve dysfunction with severe (4+) mitral regurgitation  Type I tricuspid valve dysfunction with moderate (3+) tricuspid regurgitation  No residual mitral regurgitation following successful valve repair  Trace residual tricuspid regurgitation following successful valve repair                BRIEF CLINICAL NOTE AND INDICATIONS FOR SURGERY  Patient is a 62 year old African American male with long-standing history of dilated nonischemic cardiomyopathy and chronic combined systolic and diastolic congestive heart failure. The patient states that he first presented in 2001 with complete heart block and underwent placement of a permanent pacemaker. Until recently he had been followed by Dr. Alanda Amass. In 2012 he began to develop progressive symptoms of congestive heart failure. Symptoms gradually progressed until this past December when he want up in the emergency department with class IV congestive heart failure pulmonary edema. He responded quickly to diuretic therapy. He ultimately was referred to Dr. Ladona Ridgel for biventricular pacemaker upgrade which was performed in March of this year. With biventricular pacing the patient's symptoms of congestive heart failure has stabilized nicely, but followup echocardiogram performed in may revealed the presence of severe mitral  regurgitation and severe tricuspid regurgitation. The patient recently was referred to the congestive heart failure clinic where he was evaluated by Dr. Gala Romney on 03/03/2013. Based upon review of the patient's most recent echocardiogram, Dr. Gala Romney felt that the patient might benefit from elective surgical intervention for mitral and tricuspid regurgitation. The patient was referred for surgical consultation. Patient returns for followup of severe mitral regurgitation with history of dilated nonischemic cardiomyopathy and chronic combined systolic and diastolic congestive heart failure. He was originally seen in consultation on 03/11/2013. Since then he underwent dental extraction by Dr. Robin Searing and transesophageal echocardiogram by Dr. Gala Romney. Transesophageal echocardiogram confirmed the presence of severe mitral regurgitation with a broad central jet and type I mitral valve dysfunction. There was moderate tricuspid regurgitation and mild aortic insufficiency.  The patient has been seen in consultation and counseled at length regarding the indications, risks and potential benefits of surgery.  All questions have been answered, and the patient provides full informed consent for the operation as described.     DETAILS OF THE OPERATIVE PROCEDURE  Preparation:  The patient is brought to the operating room on the above mentioned date and central monitoring was established by the anesthesia team including placement of Swan-Ganz catheter through the left internal jugular vein.  However, the Swan-Ganz catheter could not be floated through the right atrium, so it was pulled out to be replaced at the end of the procedure.  A radial arterial line is placed. The patient is placed in the supine position on the operating table.  Intravenous antibiotics are administered. General endotracheal anesthesia is induced uneventfully. The patient is initially intubated using a dual lumen endotracheal tube.  A Foley  catheter is placed.  Baseline transesophageal echocardiogram was performed.  Findings were notable for global LV  dysfunction with severe LV chamber enlargement.  There was type I mitral valve dysfunction and severe mitral regurgitation.  The mitral annulus was dilated.  There was moderate RV chamber enlargement and tricuspid annulus dilation to 5 cm.  There was type I tricuspid valve dysfunction with moderate tricuspid regurgitation.  There was mild (1+) central aortic insufficiency.  A soft roll is placed behind the patient's left scapula and the neck gently extended and turned to the left.   The patient's right neck, chest, abdomen, both groins, and both lower extremities are prepared and draped in a sterile manner. A time out procedure is performed.  Surgical Approach:  A right miniature anterolateral thoracotomy incision is performed. The incision is placed just lateral to and superior to the right nipple. The pectoralis major muscle is retracted medially and completely preserved. The right pleural space is entered through the 3rd intercostal space. A soft tissue retractor is placed.  Two 11 mm ports are placed through separate stab incisions inferiorly. The right pleural space is insufflated continuously with carbon dioxide gas through the posterior port during the remainder of the operation.  A pledgeted sutures placed through the dome of the right hemidiaphragm and retracted inferiorly to facilitate exposure.  A longitudinal incision is made in the pericardium 3 cm anterior to the phrenic nerve and silk traction sutures are placed on either side of the incision for exposure.   Extracorporeal Cardiopulmonary Bypass and Myocardial Protection:  A small incision is made in the right inguinal crease and the anterior surface of the right common femoral artery and right common femoral vein are identified.  The patient is placed in Trendelenburg position. The right internal jugular vein is cannulated  with Seldinger technique and a guidewire advanced into the right atrium. The patient is heparinized systemically. The right internal jugular vein is cannulated with a 14 [place] pediatric femoral venous cannula. Pursestring sutures are placed on the anterior surface of the right common femoral vein and right common femoral artery. The right common femoral vein is cannulated with the Seldinger technique and a guidewire is advanced under transesophageal echocardiogram guidance through the right atrium. The femoral vein is cannulated with a long 22 French femoral venous cannula. The right common femoral artery is cannulated with Seldinger technique and a flexible guidewire is advanced until it can be appreciated intraluminally in the descending thoracic aorta on transesophageal echocardiogram. The femoral artery is cannulated with an 18 French femoral arterial cannula.  Adequate heparinization is verified.     The entire pre-bypass portion of the operation was notable for stable hemodynamics.  Cardiopulmonary bypass was begun.  Vacuum assist venous drainage is utilized. The incision in the pericardium is extended in both directions. Venous drainage and exposure are notably excellent.  An antegrade cardioplegia cannula is placed in the ascending aorta.  Umbilical tapes are placed around the superior and inferior vena cavae.  The patient is cooled to 28C systemic temperature.  The aortic cross clamp is applied and cold blood cardioplegia is delivered initially in an antegrade fashion through the aortic root.  The initial cardioplegic arrest is rapid with early diastolic arrest.  Repeat doses of cardioplegia are administered intermittently every 20 to 30 minutes throughout the entire cross clamp portion of the operation through the aortic root in order to maintain completely flat electrocardiogram.  Myocardial protection was felt to be excellent.   Mitral Valve Repair:  A left atriotomy incision was performed  through the interatrial groove and extended partially across the back  wall of the left atrium after opening the oblique sinus inferiorly.  The mitral valve is exposed using a self-retaining retractor.  The mitral valve was inspected and notable for normal leaflet anatomy and mobility.  There was severe annular dilatation.  Interrupted 2-0 Ethibond horizontal mattress sutures are placed circumferentially around the entire mitral valve annulus.  The valve was tested with saline and appeared competent even without ring annuloplasty complete. The valve was sized to a 28 mm annuloplasty ring, based upon the transverse distance between the left and right commissures and the height of the anterior leaflet, corresponding to a size just slightly larger than the overall surface area of the anterior leaflet.  A Sorin Memo 3D annuloplasty ring (size 28mm, catalog [id number], serial [id number]) was secured in place uneventfully. The valve was tested with saline and appeared competent.   The valve is again tested with saline and appears to be perfectly competent with a broad symmetrical line of coaptation of the anterior and posterior leaflet. There is no residual leak. There was a broad, symmetrical line of coaptation of the anterior and posterior leaflet which was confirmed using the blue ink test. The atriotomy was closed using a 2-layer closure of running 3-0 Prolene suture after placing a sump drain across the mitral valve to serve as a left ventricular vent.      Tricuspid Valve Repair:  The inferior vena cava cannula was pulled down until the tip was just below the junction between the right atrium and the inferior vena cava. An oblique incision is made in the right atrium. Traction sutures are placed to facilitate exposure of the tricuspid valve. The tricuspid valve is inspected carefully. The tricuspid valve leaflets appear normal with normal mobility and no sign of any fibrosis or thickening. Care is taken to avoid  moving the 3 indwelling pacemaker leads.  There are no adhesions between the ventricular pacemaker lead and the tricuspid valve.  Ring annuloplasty is performed using interrupted 2-0 Ethibond horizontal mattress sutures placed circumferentially around the tricuspid annulus with exception of the area immediately below the triangle of Koch. The valve was sized to accept a 28mm annuloplasty ring based upon the overall surface area of the combined anterior and posterior leaflets. An [facility] 3 annuloplasty ring (size 28mm, model #[id number], serial [id number]) is implanted uneventfully. After completion of the annuloplasty the valve was tested with saline and appears to be competent. Rewarming is begun.     Procedure Completion:  One final dose of warm retrograde "hot shot" cardioplegia was administered through the aortic root.  The aortic cross clamp was removed after a total cross clamp time of 111 minutes.  The right atriotomy incision is closed using a 2 layer closure of running 4-0 Prolene suture.  Epicardial pacing wires are fixed to the inferior wall of the right ventricule and to the right atrial appendage. The patient is rewarmed to 37C temperature. The left ventricular vent is removed.  The patient is ventilated and flow volumes turndown while the mitral valve repair is inspected using transesophageal echocardiogram. The valve repair appears intact with no residual leak. The antegrade cardioplegia cannula is now removed. The patient is weaned and disconnected from cardiopulmonary bypass.  The patient's rhythm at separation from bypass was AV paced.  The patient was weaned from bypass on low dose milrinone and dopamine infusions. Total cardiopulmonary bypass time for the operation was 167 minutes.  Followup transesophageal echocardiogram performed after separation from bypass revealed a well-seated annuloplasty ring in the mitral  position with a normal functioning mitral valve. There was no residual leak.   Left ventricular function was unchanged from preoperatively.  The mean gradient across the mitral valve was estimated to be 4 mmHg.  There was a well-seated annuloplasty ring in the tricuspid position with normal functioning valve.  There was trace central regurgitation.  The femoral arterial and venous cannulae were removed uneventfully. There was a palpable pulse in the distal right common femoral artery after removal of the cannula. Protamine was administered to reverse the anticoagulation. The right internal jugular cannula was removed and manual pressure held on the neck for 15 minutes.  Single lung ventilation was begun. The atriotomy closure was inspected for hemostasis. The pericardial sac was drained using a 28 French Bard drain placed through the anterior port incision.  The pericardium was closed using a patch of core matrix bovine submucosal tissue patch. The right pleural space is irrigated with saline solution and inspected for hemostasis. The right pleural space was drained using a 28 French Bard drain placed through the posterior port incision. The miniature thoracotomy incision was closed in multiple layers in routine fashion. The right groin incision was inspected for hemostasis and closed in multiple layers in routine fashion.  The post-bypass portion of the operation was notable for stable rhythm and hemodynamics.  No blood products were administered during the operation.   Disposition:  The patient tolerated the procedure well.  The patient was reintubated using a single lumen endotracheal tube and a new Swan-Ganz catheter placed via the right internal jugular vein.  The patient was subsequently transported to the surgical intensive care unit in stable condition. There were no intraoperative complications. All sponge instrument and needle counts are verified correct at completion of the operation.     Salvatore Decent. Cornelius Moras MD 04/29/2013 3:29 PM

## 2013-04-29 NOTE — H&P (Addendum)
[address]       [place] [address]             [phone]          CARDIOTHORACIC SURGERY HISTORY AND PHYSICAL EXAM  Referring Provider is Bensimhon, Bevelyn Buckles, MD  PCP is Cain Saupe, MD   Chief Complaint   Patient presents with   .  Mitral Regurgitation     and Tricuspid Vave Regurg....eval and treat...ECHO 01/27/13.....last heart CATH 09/19/12   .  Congestive Heart Failure     HPI:   Patient is a 62 year old African American male with long-standing history of dilated nonischemic cardiomyopathy and chronic combined systolic and diastolic congestive heart failure. The patient states that he first presented in 2001 with complete heart block and underwent placement of a permanent pacemaker. Until recently he had been followed by Dr. Alanda Amass. In 2012 he began to develop progressive symptoms of congestive heart failure. Symptoms gradually progressed until this past December when he want up in the emergency department with class IV congestive heart failure pulmonary edema. He responded quickly to diuretic therapy. He ultimately was referred to Dr. Ladona Ridgel for biventricular pacemaker upgrade which was performed in March of this year. With biventricular pacing the patient's symptoms of congestive heart failure has stabilized nicely, but followup echocardiogram performed in may revealed the presence of severe mitral regurgitation and severe tricuspid regurgitation. The patient recently was referred to the congestive heart failure clinic where he was evaluated by Dr. Gala Romney on 03/03/2013. Based upon review of the patient's most recent echocardiogram, Dr. Gala Romney felt that the patient might benefit from elective surgical intervention for mitral and tricuspid regurgitation. The patient was referred for surgical consultation. Patient returns for followup of severe mitral regurgitation with history of dilated nonischemic cardiomyopathy and chronic combined systolic and  diastolic congestive heart failure. He was originally seen in consultation on 03/11/2013. Since then he underwent dental extraction by Dr. Robin Searing and transesophageal echocardiogram by Dr. Gala Romney. Transesophageal echocardiogram confirmed the presence of severe mitral regurgitation with a broad central jet and type I mitral valve dysfunction. There was moderate tricuspid regurgitation and mild aortic insufficiency.   At present the patient reports stable symptoms of exertional shortness of breath which typically only occur with moderate physical activity, functional class II. He denies resting shortness of breath, PND, orthopnea, or lower extremity edema. He states that these symptoms have all remained reasonably stable and improved since he began biventricular pacing last March. He has never had any chest pain or chest tightness. He has not had any tachypalpitations or dizzy spells.   Past Medical History  Diagnosis Date  . CHF (congestive heart failure)     a. NICM EF 35% - no CAD by cath 09/2012. b. s/p upgrade to Bi-V pacemaker 11/2012 (did not get ICD as Dr. Ladona Ridgel expects his EF will improve with bi-V pacing).  . Hyperlipidemia   . Hypertension     pt denies HTN  . CHB (complete heart block)     a. Pacer initially placed ~2002.   . Mitral valve regurgitation   . Pacemaker     st jude    greg taylor    Past Surgical History  Procedure Laterality Date  . Pacemaker placement    . Cardiac catheterization      clean  . Multiple extractions with alveoloplasty N/A 03/20/2013    Procedure: Extraction of tooth #'s 1,6,7,8,9,10,16,17,20,21,22,23,24,25,26,27,28, 29 with alveoloplasty;  Surgeon: [name],  DDS;  Location: [facility] OR;  Service: Oral Surgery;  Laterality: N/A;  . Tee without cardioversion N/A 04/09/2013    Procedure: TRANSESOPHAGEAL ECHOCARDIOGRAM (TEE);  Surgeon: Dolores Patty, MD;  Location: [facility] ENDOSCOPY;  Service: Cardiovascular;  Laterality: N/A;    History reviewed.  No pertinent family history.  Social History History  Substance Use Topics  . Smoking status: Former Games developer  . Smokeless tobacco: Never Used  . Alcohol Use: No    Prior to Admission medications   Medication Sig Start Date End Date Taking? Authorizing Provider  amiodarone (PACERONE) 200 MG tablet Take 200 mg by mouth 2 (two) times daily.   Yes Historical Provider, MD  aspirin EC 81 MG tablet Take 81 mg by mouth daily.   Yes Historical Provider, MD  carvedilol (COREG) 12.5 MG tablet Take 12.5 mg by mouth daily.   Yes Historical Provider, MD  digoxin (LANOXIN) 0.125 MG tablet Take 0.125 mg by mouth daily.   Yes Historical Provider, MD  furosemide (LASIX) 20 MG tablet Take 20 mg by mouth 2 (two) times daily.   Yes Historical Provider, MD  losartan (COZAAR) 25 MG tablet Take 25 mg by mouth daily.   Yes Historical Provider, MD  Multiple Vitamin (MULTIVITAMIN WITH MINERALS) TABS Take 1 tablet by mouth daily.   Yes Historical Provider, MD  niacin 500 MG tablet Take 500 mg by mouth daily with breakfast.   Yes Historical Provider, MD  simvastatin (ZOCOR) 80 MG tablet Take 80 mg by mouth at bedtime.   Yes Historical Provider, MD    Review of Systems:              General:                      normal appetite, normal energy, no weight gain, no weight loss, no fever             Cardiac:                      no chest pain with exertion, no chest pain at rest, + SOB with moderate exertion, no resting SOB, no PND, no orthopnea, no palpitations, no arrhythmia, no atrial fibrillation, no LE edema, no dizzy spells, no syncope             Respiratory:                + exertional shortness of breath, no home oxygen, no productive cough, occasional dry cough, no bronchitis, no wheezing, no hemoptysis, no asthma, no pain with inspiration or cough, no sleep apnea, no CPAP at night             GI:                                no difficulty swallowing, no reflux, no frequent heartburn, no hiatal hernia, no  abdominal pain, no constipation, no diarrhea, no hematochezia, no hematemesis, no melena             GU:                              no dysuria,  + frequency, no urinary tract infection, no hematuria, no enlarged prostate, no kidney stones, mild kidney disease             Vascular:  no pain suggestive of claudication, + pain in feet, no leg cramps, no varicose veins, no DVT, no non-healing foot ulcer             Neuro:                         no stroke, no TIA's, no seizures, no headaches, no temporary blindness one eye,  no slurred speech, no peripheral neuropathy, no chronic pain, no instability of gait, no memory/cognitive dysfunction             Musculoskeletal:         no arthritis, no joint swelling, no myalgias, minor difficulty walking, normal mobility               Skin:                            no rash, no itching, no skin infections, no pressure sores or ulcerations             Psych:                         no anxiety, no depression, no nervousness, no unusual recent stress             Eyes:                           no blurry vision, no floaters, no recent vision changes, does not wears glasses or contacts             ENT:                            no hearing loss, + loose or painful teeth, no dentures, last saw dentist many years ago, teeth in poor condition             Hematologic:               no easy bruising, no abnormal bleeding, no clotting disorder, no frequent epistaxis             Endocrine:                   no diabetes, does not check CBG's at home                           Physical Exam:              BP 129/74  Pulse 65  Resp 16  Ht 5' 10.5" (1.791 m)  Wt 176 lb 8 oz (80.06 kg)  BMI 24.96 kg/m2  SpO2 98%             General:                        well-appearing             HEENT:                       Unremarkable               Neck:                           no JVD,  no bruits, no adenopathy               Chest:                         clear  to auscultation, symmetrical breath sounds, no wheezes, no rhonchi               CV:                              RRR, grade IV/VI holosystolic murmur               Abdomen:                    soft, non-tender, no masses               Extremities:                 warm, well-perfused, pulses palpable in groin, no LE edema             Rectal/GU                   Deferred             Neuro:                         Grossly non-focal and symmetrical throughout             Skin:                            Clean and dry, no rashes, no breakdown   Diagnostic Tests:    Transthoracic Echocardiography  Patient: Russell, Martin MR #: [id number] Study Date: 01/27/2013 Gender: M Age: 71 Height: 177.8cm Weight: 85.7kg BSA: 2.70m^2 Pt. Status: Room:  Cheron Schaumann, MD [facility] PERFORMING Susa Griffins, MD [facility] REFERRING Susa Griffins, MD [facility] ATTENDING Rennis Golden, Iantha Fallen REFERRING [place], Iantha Fallen SONOGRAPHER Clearence Ped, RCS cc:  ------------------------------------------------------------ LV EF: 40% - 45%  ------------------------------------------------------------ Indications: 428.0 CHF.  ------------------------------------------------------------ History: PMH: Dyspnea. Primary pulmonary hypertension.  ------------------------------------------------------------ Study Conclusions  - Left ventricle: The cavity size was moderately dilated. There was moderate concentric hypertrophy. Systolic function was mildly to moderately reduced. The estimated ejection fraction was in the range of 40% to 45%. Moderate hypokinesis of the anteroseptal myocardium. - Aortic valve: Mild regurgitation. - Mitral valve: There was malcoaptation of the valve leaflets. Severe regurgitation directed eccentrically and posteriorly. - Left atrium: The appendage was moderately to severely dilated. - Right ventricle: The cavity size was mildly dilated. Systolic pressure was increased. - Right  atrium: The appendage was mildly dilated. - Atrial septum: No defect or patent foramen ovale was identified. - Tricuspid valve: Moderate regurgitation. - Pulmonary arteries: PA peak pressure: 58mm Hg (S). Impressions:  - EF improved s/p Bi-V pacing, Severe MR persists. NSR with Bi-V paced rhythm. The right ventricular systolic pressure was increased consistent with moderate pulmonary hypertension.  ------------------------------------------------------------ Labs, prior tests, procedures, and surgery: Permanent pacemaker system implantation.  Transthoracic echocardiography. M-mode, complete 2D, spectral Doppler, and color Doppler. Height: Height: 177.8cm. Height: 70in. Weight: Weight: 85.7kg. Weight: 188.6lb. Body mass index: BMI: 27.1kg/m^2. Body surface area: BSA: 2.42m^2. Blood pressure: 110/70. Patient status: Outpatient. Location: Echo laboratory.  ------------------------------------------------------------  ------------------------------------------------------------ Left ventricle: The cavity size was moderately dilated. There was moderate concentric  hypertrophy. Systolic function was mildly to moderately reduced. The estimated ejection fraction wasapproximately 45% . Regional wall motion abnormalities: Moderate hypokinesis of the anteroseptal myocardium. No significant intra-ventricular dyssynchrony,  ------------------------------------------------------------ Aortic valve: Trileaflet. Doppler: There was no stenosis. Mild regurgitation. VTI ratio of LVOT to aortic valve: 0.47. Peak velocity ratio of LVOT to aortic valve: 0.47. Mean gradient: 5mm Hg (S).  ------------------------------------------------------------ Aorta: The aorta was normal, not dilated, and non-diseased.  ------------------------------------------------------------ Mitral valve: No echocardiographic evidence for prolapse. There was malcoaptation of the valve leaflets. Doppler: Severe  regurgitation with atrial reversal, directed eccentrically and posteriorly in modt views. Some views appeared central. Valve area by pressure half-time: 2.97cm^2. Indexed valve area by pressure half-time: 1.46cm^2/m^2. Mean gradient: 4mm Hg (D). Peak gradient: 12mm Hg (D).  ------------------------------------------------------------ Left atrium: LA volume/ BSA = 38.7 ml/m2 The appendage was moderately to severely dilated.  ------------------------------------------------------------ Atrial septum: No defect or patent foramen ovale was identified.  ------------------------------------------------------------ Right ventricle: The cavity size was mildly dilated. Pacer wire or catheter noted in right ventricle. Systolic pressure was increased.  ------------------------------------------------------------ Pulmonic valve: The valve appears to be grossly normal. Doppler: Trivial regurgitation.  ------------------------------------------------------------ Tricuspid valve: Structurally normal valve. Leaflet separation was normal. Doppler: Transvalvular velocity was within the normal range. Moderate regurgitation.  ------------------------------------------------------------ Right atrium: Pacer wire or catheter noted in right atrium. The appendage was mildly dilated.  ------------------------------------------------------------ Pericardium: The pericardium was normal in appearance.  ------------------------------------------------------------ Systemic veins: Inferior vena cava: The vessel was dilated; the respirophasic diameter changes were blunted (< 50%); findings are consistent with elevated central venous pressure. Diameter: 22mm.  ------------------------------------------------------------ Post procedure conclusions Ascending Aorta:  - The aorta was normal, not dilated, and non-diseased.  ------------------------------------------------------------  2D measurements  Normal Doppler measurements Normal IVC Main pulmonary Diam 22 mm ------ artery Left ventricle Pressure, 58 mm Hg =30 LVID ED, 63.2 mm 43-52 S chord, Left ventricle PLAX Ea, lat 9.1 cm/s ------ LVID ES, 47.5 mm 23-38 ann, tiss chord, DP PLAX E/Ea, lat 14.9 ------ FS, chord, 25 % >29 ann, tiss 5 PLAX DP LVPW, ED 13.1 mm ------ Ea, med 9.1 cm/s ------ IVS/LVPW 0.87 <1.3 ann, tiss ratio, ED DP Vol ED, 213 ml ------ E/Ea, med 14.9 ------ MOD1 ann, tiss 5 Vol ES, 100 ml ------ DP MOD1 LVOT EF, MOD1 53 % ------ Peak vel, 71.4 cm/s ------ Vol index, 104 ml/m^2 ------ S ED, MOD1 VTI, S 10.6 cm ------ Vol index, 49 ml/m^2 ------ Aortic valve ES, MOD1 Peak vel, 151 cm/s ------ Vol ED, 232 ml ------ S MOD2 Mean vel, 100 cm/s ------ Vol ES, 109 ml ------ S MOD2 VTI, S 22.5 cm ------ EF, MOD2 53 % ------ Mean 5 mm Hg ------ Stroke 123 ml ------ gradient, vol, MOD2 S Vol index, 114 ml/m^2 ------ VTI ratio 0.47 ------ ED, MOD2 LVOT/AV Vol index, 53 ml/m^2 ------ Peak vel 0.47 ------ ES, MOD2 ratio, Stroke 60.3 ml/m^2 ------ LVOT/AV index, Regurg PHT 351 ms ------ MOD2 Mitral valve Ventricular septum Peak E vel 136 cm/s ------ IVS, ED 11.4 mm ------ Peak A vel 50.8 cm/s ------ Aorta Mean vel, 85.5 cm/s ------ Root diam, 28 mm ------ D ED Decelerati 173 ms 150-23 Left atrium on time 0 AP dim 57 mm ------ Pressure 74 ms ------ AP dim 2.79 cm/m^2 <2.2 half-time index Mean 4 mm Hg ------ Right ventricle gradient, RVID ED, 37.6 mm 19-38 D PLAX Peak 12 mm Hg ------ gradient, D Peak E/A 2.7 ------ ratio Area (PHT) 2.97 cm^2 ------ Area index  1.46 cm^2/m ------ (PHT) ^2 Annulus 35.8 cm ------ VTI Regurg 30.8 cm/s ------ alias vel, PISA Max regurg 624 cm/s ------ vel Regurg VTI 181 cm ------ ERO, PISA 0.31 cm^2 ------ Regurg 56 ml ------ vol, PISA Tricuspid valve Regurg 345 cm/s ------ peak vel Peak RV-RA 48 mm Hg ------ gradient, S Systemic veins Estimated 10 mm Hg  ------ CVP Right ventricle Pressure, 58 mm Hg <30 S Sa vel, 16.9 cm/s ------ lat ann, tiss DP  ------------------------------------------------------------ Prepared and Electronically Authenticated by  Susa Griffins, MD [facility] 2014-05-20T09:47:[id number]            CARDIAC CATHETERIZATION   History obtained from chart review and the patient.   PROCEDURE DESCRIPTION:   The patient was brought to the second floor   [place] [facility] cath lab in the postabsorptive state. He was   premedicated with . His right groin   was prepped and shaved in usual sterile fashion. Xylocaine 1% was used   for local anesthesia. A 5 French sheath was inserted into the   RFA and 7 [place] into the RFV. CO was determined by the Fick and Thermodilution methods. 5 [place] FL4, FR4, and pigtail catheters were utilized. Hemostasis was obtained by manual compression.   HEMODYNAMICS:   RA: 11   RV: 58-60/10   PA: 60/28; mean 44   PC: 17; v wave 25   AO: 116/68   LV: 116/12   CO: 5.4 (Thermo); 5.3 l/m   CI: 2.6 2.5 l/m/m2   ANGIOGRAPHIC RESULTS:   1. Left main: Nl   2. LAD: Nl   3. Left circumflex: Nl.   4. Right coronary artery: Nl   LV: globally reduced EF 30 -35%   IMPRESSION: Nonischemic Cardiomyopathy   Per Dr. Kandis Cocking recommendation, I have discussed with Dr.Taylor concerning evaluation for Bi-Ventricular upgrade to see if LV function improves, and may ultimately require ICD.   [name], MD, [facility]   09/19/2012   11:20 AM    TRANSESOPHAGEAL ECHOCARDIOGRAM  NAME: Russell [place] MRN: [id number]  DOB: September 23, 1950 ADMIT DATE: 04/09/2013  INDICATIONS: Mitral regurgitation  PROCEDURE:  Informed consent was obtained prior to the procedure. The risks, benefits and alternatives for the procedure were discussed and the patient comprehended these risks. Risks include, but are not limited to, cough, sore throat, vomiting, nausea, somnolence, esophageal and stomach trauma or perforation, bleeding,  low blood pressure, aspiration, pneumonia, infection, trauma to the teeth and death.  After a procedural time-out, the patient was given 7 mg versed and 50 mcg fentanyl for moderate sedation. The oropharynx was anesthetized 10 cc of topical 1% viscous lidocaine. The transesophageal probe was inserted in the esophagus and stomach without difficulty and multiple views were obtained.  COMPLICATIONS:  There were no immediate complications.  FINDINGS:  LEFT VENTRICLE: EF = 40%. No regional wall motion abnormalities.   RIGHT VENTRICLE: Normal size. Mild to moderate HK. + pacer wire  LEFT ATRIUM: Dilated  LEFT ATRIAL APPENDAGE: Not well visualized  RIGHT ATRIUM: Normal. + pacer wire  AORTIC VALVE: Trileaflet. Mild AI. No AS  MITRAL VALVE: Dilated annulus. Severe central MR with flow reversal in pulmonary veins  TRICUSPID VALVE: Moderate TR  PULMONIC VALVE: Grossly normal.  INTERATRIAL SEPTUM: No PFO or ASD.  PERICARDIUM: No effusion  DESCENDING AORTA: Mild plaque    CT ANGIOGRAPHY CHEST, ABDOMEN AND PELVIS  Technique: Multidetector CT imaging through the chest, abdomen and  pelvis was performed using the standard protocol during bolus  administration of  intravenous contrast. Multiplanar reconstructed  images including MIPs were obtained and reviewed to evaluate the  vascular anatomy.  Contrast: 100 ml of Omnipaque 350.  Comparison: CT of the abdomen and pelvis 10/28/2012.  CTA CHEST  Findings:  Mediastinum: The aorta is normal in size measuring approximately  3.4 cm, 3.1 cm and 2.6 cm in diameter in the ascending aorta, arch  and descending aorta respectively. No evidence of thoracic aortic  dissection at this time. There is only minimal atherosclerosis of  the thoracic aorta and great vessels of the mediastinum. No  coronary artery calcifications are identified at this time. Heart  size is enlarged. There is no significant pericardial fluid,  thickening or pericardial calcification.  Left-sided pacemaker  device in place with lead tips terminating in the right ventricular  apex, right atrial appendage, and overlying the lateral wall of the  left ventricle via the coronary sinus and coronary veins. No  pathologically enlarged mediastinal or hilar lymph nodes. The  esophagus is unremarkable in appearance.  Lungs/Pleura: There is a background of mild centrilobular  emphysema, most pronounced in the lung apices. No acute  consolidative airspace disease. No pleural effusions. There are a  few scattered tiny pulmonary nodules, predominately throughout the  periphery of the lungs bilaterally, ranging in size from 1 to 3 mm.  In addition, there is a 4 mm subpleural nodule in the posterior  aspect of the right upper lobe abutting major fissure (image 24  series 5). No larger more suspicious appearing pulmonary nodules  or masses are identified at this time.  Musculoskeletal: There are no aggressive appearing lytic or blastic  lesions noted in the visualized portions of the skeleton.  Review of the MIP images confirms the above findings.  IMPRESSION:  1. No evidence of thoracic aortic aneurysm or dissection. There is  only minimal atherosclerosis of the thoracic aorta and great  vessels.  2. Cardiomegaly.  3. Mild centrilobular emphysema.  4. Multiple tiny pulmonary nodules scattered throughout the lungs  bilaterally, the largest of which measures only 4 mm of the  posterior aspect of the right upper lobe. Given the smoking  related changes in the lungs, the patient is at high risk for  bronchogenic carcinoma, and a follow-up chest CT at 1 year is  recommended. This recommendation follows the consensus statement:  Guidelines for Management of Small Pulmonary Nodules Detected on CT  Scans: A Statement from the Fleischner Society as published in  Radiology 2005; 237:395-400.  CTA ABDOMEN AND PELVIS  Findings:  Abdomen/Pelvis: Moderate atherosclerosis of the abdominal and    pelvic vasculature, without evidence of aneurysm, dissection or  significant aortoiliac occlusive disease. Single renal arteries  bilaterally. The celiac axis, superior mesenteric artery and  inferior mesenteric artery are all widely patent.  The appearance of the liver, gallbladder, pancreas, spleen,  bilateral adrenal glands and bilateral kidneys is unremarkable. No  significant volume of ascites. No pneumoperitoneum. No pathologic  distension of small bowel. No definite pathologic lymphadenopathy  identified within the abdomen or pelvis. Prostate gland and  urinary bladder are unremarkable in appearance.  Musculoskeletal: There are no aggressive appearing lytic or blastic  lesions noted in the visualized portions of the skeleton.  Review of the MIP images confirms the above findings.  IMPRESSION:  1. Although there is moderate atherosclerosis throughout the  abdominal and pelvic vasculature, there is no evidence of aneurysm,  dissection or significant aortoiliac occlusive disease.  Original Report Authenticated By: Trudie Reed, M.D.  Impression:   Patient has long-standing history of dilated nonischemic cardiomyopathy with chronic combined systolic and diastolic congestive heart failure. Congestive heart failure progressed to the point of class III to class IV symptoms by the end of this past year, but have recently improved and stabilized since the patient underwent biventricular pacemaker upgrade to commence biventricular pacing. At present he remains remarkably stable with functionally class II symptoms that most. However, followup echocardiogram demonstrates the presence of severe mitral and tricuspid regurgitation related to pure annular dilatation with type I mitral and tricuspid valve dysfunction. Alternatives include continued medical therapy with close followup versus proceeding with elective mitral and tricuspid valve repair or replacement. The patient appears to be a  good candidate for minimally invasive approach for surgery.    Plan:   I have reviewed the indications, risks, and potential benefits of elective mitral and tricuspid valve repair or replacement with the patient and his wife in the office today. Alternative treatment strategies been discussed in detail including continued medical therapy for the time being versus proceeding with elective surgery. The likelihood of successful and durable valve repair has been discussed with particular reference to the findings of their recent echocardiogram. Based upon these findings and previous experience, I have quoted them a greater than 80 percent likelihood of successful valve repair. In the unlikely event that their valve cannot be successfully repaired, we discussed the possibility of replacing the mitral valve using a mechanical prosthesis with the attendant need for long-term anticoagulation versus the alternative of replacing it using a bioprosthetic tissue valve with its potential for late structural valve deterioration and failure, depending upon the patient's longevity. The patient specifically requests that if the mitral valve must be replaced that it be done using a bioprosthetic tissue valve. Alternative surgical approaches have been discussed, including a comparison between conventional sternotomy and minimally-invasive techniques. The relative risks and benefits of each have been reviewed as they pertain to the patient's specific circumstances, and all of their questions have been addressed. The patient is eager to proceed with surgery in the near future while he remains clinically stable. He is interested in minimally invasive approach for surgery feasible.  The patient has been given a prescription for amiodarone to begin one week prior to surgery to decrease his risk of perioperative atrial arrhythmias. He has been instructed to stop taking Zocor temporarily while he is on amiodarone.     Salvatore Decent.  Cornelius Moras, MD

## 2013-04-29 NOTE — Progress Notes (Signed)
  Echocardiogram Echocardiogram Transesophageal has been performed.  Georgian Co 04/29/2013, 10:46 AM

## 2013-04-29 NOTE — Transfer of Care (Signed)
Immediate Anesthesia Transfer of Care Note  Patient: Russell Martin  Procedure(s) Performed: Procedure(s): MINIMALLY INVASIVE MITRAL VALVE REPAIR (MVR) (Right) MINIMALLY INVASIVE TRICUSPID VALVE REPAIR (Right) INTRAOPERATIVE TRANSESOPHAGEAL ECHOCARDIOGRAM (N/A)  Patient Location: SICU  Anesthesia Type:General  Level of Consciousness: sedated unresponsive  Airway & Oxygen Therapy: Patient remains intubated per anesthesia plan and Patient placed on Ventilator (see vital sign flow sheet for setting)  Post-op Assessment: Report given to PACU RN and Post -op Vital signs reviewed and stable  Post vital signs: Reviewed and stable  Complications: No apparent anesthesia complications

## 2013-04-30 ENCOUNTER — Inpatient Hospital Stay (HOSPITAL_COMMUNITY): Payer: Federal, State, Local not specified - PPO

## 2013-04-30 DIAGNOSIS — I359 Nonrheumatic aortic valve disorder, unspecified: Secondary | ICD-10-CM

## 2013-04-30 LAB — POCT I-STAT 3, ART BLOOD GAS (G3+)
Acid-base deficit: 1 mmol/L (ref 0.0–2.0)
Acid-base deficit: 1 mmol/L (ref 0.0–2.0)
Acid-base deficit: 1 mmol/L (ref 0.0–2.0)
Acid-base deficit: 2 mmol/L (ref 0.0–2.0)
Bicarbonate: 25.4 mEq/L — ABNORMAL HIGH (ref 20.0–24.0)
Bicarbonate: 24.1 mEq/L — ABNORMAL HIGH (ref 20.0–24.0)
Bicarbonate: 23.5 mEq/L (ref 20.0–24.0)
O2 Saturation: 91 %
O2 Saturation: 99 %
O2 Saturation: 95 %
Patient temperature: 35.6
Patient temperature: 97.4
Patient temperature: 101.3
Patient temperature: 101.3
TCO2: 27 mmol/L (ref 0–100)
TCO2: 25 mmol/L (ref 0–100)
TCO2: 25 mmol/L (ref 0–100)
pCO2 arterial: 43.8 mmHg (ref 35.0–45.0)
pCO2 arterial: 45.4 mmHg — ABNORMAL HIGH (ref 35.0–45.0)
pH, Arterial: 7.355 (ref 7.350–7.450)
pH, Arterial: 7.329 — ABNORMAL LOW (ref 7.350–7.450)
pO2, Arterial: 85 mmHg (ref 80.0–100.0)
pO2, Arterial: 166 mmHg — ABNORMAL HIGH (ref 80.0–100.0)
pO2, Arterial: 85 mmHg (ref 80.0–100.0)

## 2013-04-30 LAB — GLUCOSE, CAPILLARY
Glucose-Capillary: 117 mg/dL — ABNORMAL HIGH (ref 70–99)
Glucose-Capillary: 118 mg/dL — ABNORMAL HIGH (ref 70–99)
Glucose-Capillary: 133 mg/dL — ABNORMAL HIGH (ref 70–99)
Glucose-Capillary: 141 mg/dL — ABNORMAL HIGH (ref 70–99)
Glucose-Capillary: 72 mg/dL (ref 70–99)
Glucose-Capillary: 87 mg/dL (ref 70–99)

## 2013-04-30 LAB — POCT I-STAT, CHEM 8
BUN: 14 mg/dL (ref 6–23)
Calcium, Ion: 1.22 mmol/L (ref 1.13–1.30)
Creatinine, Ser: 1.3 mg/dL (ref 0.50–1.35)
Creatinine, Ser: 1.4 mg/dL — ABNORMAL HIGH (ref 0.50–1.35)
Glucose, Bld: 162 mg/dL — ABNORMAL HIGH (ref 70–99)
Glucose, Bld: 138 mg/dL — ABNORMAL HIGH (ref 70–99)
Hemoglobin: 10.5 g/dL — ABNORMAL LOW (ref 13.0–17.0)
Hemoglobin: 10.9 g/dL — ABNORMAL LOW (ref 13.0–17.0)
Potassium: 4.5 mEq/L (ref 3.5–5.1)
Sodium: 142 mEq/L (ref 135–145)
TCO2: 22 mmol/L (ref 0–100)
TCO2: 25 mmol/L (ref 0–100)

## 2013-04-30 LAB — MAGNESIUM
Magnesium: 2.1 mg/dL (ref 1.5–2.5)
Magnesium: 1.9 mg/dL (ref 1.5–2.5)
Magnesium: 2 mg/dL (ref 1.5–2.5)

## 2013-04-30 LAB — CBC
HCT: 30.5 % — ABNORMAL LOW (ref 39.0–52.0)
Hemoglobin: 9.1 g/dL — ABNORMAL LOW (ref 13.0–17.0)
Hemoglobin: 10 g/dL — ABNORMAL LOW (ref 13.0–17.0)
MCH: 27.2 pg (ref 26.0–34.0)
MCHC: 33.3 g/dL (ref 30.0–36.0)
MCHC: 32.8 g/dL (ref 30.0–36.0)
MCV: 83.1 fL (ref 78.0–100.0)
Platelets: 113 10*3/uL — ABNORMAL LOW (ref 150–400)
RBC: 3.67 MIL/uL — ABNORMAL LOW (ref 4.22–5.81)
RDW: 14.5 % (ref 11.5–15.5)
RDW: 14.7 % (ref 11.5–15.5)
WBC: 16.6 10*3/uL — ABNORMAL HIGH (ref 4.0–10.5)

## 2013-04-30 LAB — CREATININE, SERUM
Creatinine, Ser: 1.27 mg/dL (ref 0.50–1.35)
GFR calc Af Amer: 69 mL/min — ABNORMAL LOW (ref >90)
GFR calc Af Amer: 61 mL/min — ABNORMAL LOW (ref >90)
GFR calc non Af Amer: 60 mL/min — ABNORMAL LOW (ref >90)

## 2013-04-30 LAB — BASIC METABOLIC PANEL
GFR calc Af Amer: 60 mL/min — ABNORMAL LOW (ref >90)
GFR calc non Af Amer: 52 mL/min — ABNORMAL LOW (ref >90)
Potassium: 4.3 mEq/L (ref 3.5–5.1)
Sodium: 141 mEq/L (ref 135–145)

## 2013-04-30 LAB — POCT I-STAT 4, (NA,K, GLUC, HGB,HCT)
Potassium: 4.2 mEq/L (ref 3.5–5.1)
Sodium: 141 mEq/L (ref 135–145)

## 2013-04-30 MED ORDER — WARFARIN SODIUM 2.5 MG PO TABS
2.5000 mg | ORAL_TABLET | Freq: Every day | ORAL | Status: DC
  Administered 2013-04-30 – 2013-05-01 (×2): 2.5 mg via ORAL
  Filled 2013-04-30 (×3): qty 1

## 2013-04-30 MED ORDER — COUMADIN BOOK
Freq: Once | Status: DC
  Filled 2013-04-30: qty 1

## 2013-04-30 MED ORDER — ATORVASTATIN CALCIUM 40 MG PO TABS
40.0000 mg | ORAL_TABLET | Freq: Every day | ORAL | Status: DC
  Administered 2013-04-30 – 2013-05-03 (×4): 40 mg via ORAL
  Filled 2013-04-30 (×5): qty 1

## 2013-04-30 MED ORDER — WARFARIN VIDEO
Freq: Once | Status: AC
  Administered 2013-05-01: 13:00:00

## 2013-04-30 MED ORDER — MORPHINE SULFATE 2 MG/ML IJ SOLN
2.0000 mg | INTRAMUSCULAR | Status: DC | PRN
  Administered 2013-04-30 (×3): 2 mg via INTRAVENOUS
  Filled 2013-04-30 (×4): qty 1

## 2013-04-30 MED ORDER — INSULIN ASPART 100 UNIT/ML ~~LOC~~ SOLN: 0.0000 [IU] | SUBCUTANEOUS | Status: DC

## 2013-04-30 MED ORDER — LACTATED RINGERS IV SOLN
INTRAVENOUS | Status: DC
  Administered 2013-05-01: 06:00:00 via INTRAVENOUS

## 2013-04-30 MED ORDER — ADULT MULTIVITAMIN W/MINERALS CH
1.0000 | ORAL_TABLET | Freq: Every day | ORAL | Status: DC
  Administered 2013-04-30 – 2013-05-04 (×5): 1 via ORAL
  Filled 2013-04-30 (×5): qty 1

## 2013-04-30 MED ORDER — INSULIN ASPART 100 UNIT/ML ~~LOC~~ SOLN
0.0000 [IU] | SUBCUTANEOUS | Status: DC
  Administered 2013-04-30 – 2013-05-01 (×4): 2 [IU] via SUBCUTANEOUS

## 2013-04-30 MED ORDER — NIACIN 500 MG PO TABS
500.0000 mg | ORAL_TABLET | Freq: Every day | ORAL | Status: DC
  Administered 2013-04-30 – 2013-05-04 (×4): 500 mg via ORAL
  Filled 2013-04-30 (×6): qty 1

## 2013-04-30 MED ORDER — FUROSEMIDE 10 MG/ML IJ SOLN
20.0000 mg | Freq: Four times a day (QID) | INTRAMUSCULAR | Status: AC
  Administered 2013-04-30 (×3): 20 mg via INTRAVENOUS
  Filled 2013-04-30 (×3): qty 2

## 2013-04-30 MED ORDER — CARVEDILOL 12.5 MG PO TABS
12.5000 mg | ORAL_TABLET | Freq: Every day | ORAL | Status: DC
  Administered 2013-04-30 – 2013-05-04 (×5): 12.5 mg via ORAL
  Filled 2013-04-30 (×5): qty 1

## 2013-04-30 MED ORDER — DIGOXIN 125 MCG PO TABS
0.1250 mg | ORAL_TABLET | Freq: Every day | ORAL | Status: DC
  Administered 2013-04-30 – 2013-05-04 (×5): 0.125 mg via ORAL
  Filled 2013-04-30 (×5): qty 1

## 2013-04-30 MED ORDER — AMIODARONE HCL 200 MG PO TABS
200.0000 mg | ORAL_TABLET | Freq: Two times a day (BID) | ORAL | Status: DC
  Administered 2013-04-30 – 2013-05-04 (×9): 200 mg via ORAL
  Filled 2013-04-30 (×10): qty 1

## 2013-04-30 MED ORDER — ASPIRIN EC 81 MG PO TBEC
81.0000 mg | DELAYED_RELEASE_TABLET | Freq: Every day | ORAL | Status: DC
  Administered 2013-04-30 – 2013-05-04 (×5): 81 mg via ORAL
  Filled 2013-04-30 (×5): qty 1

## 2013-04-30 MED ORDER — WARFARIN - PHYSICIAN DOSING INPATIENT
Freq: Every day | Status: DC
  Administered 2013-05-01: 18:00:00

## 2013-04-30 MED FILL — Sodium Bicarbonate IV Soln 8.4%: INTRAVENOUS | Qty: 50 | Status: AC

## 2013-04-30 MED FILL — Lidocaine HCl IV Inj 20 MG/ML: INTRAVENOUS | Qty: 5 | Status: AC

## 2013-04-30 MED FILL — Mannitol IV Soln 20%: INTRAVENOUS | Qty: 500 | Status: AC

## 2013-04-30 MED FILL — Electrolyte-R (PH 7.4) Solution: INTRAVENOUS | Qty: 1000 | Status: AC

## 2013-04-30 MED FILL — Sodium Chloride IV Soln 0.9%: INTRAVENOUS | Qty: 1000 | Status: AC

## 2013-04-30 MED FILL — Heparin Sodium (Porcine) Inj 1000 Unit/ML: INTRAMUSCULAR | Qty: 20 | Status: AC

## 2013-04-30 MED FILL — Sodium Chloride Irrigation Soln 0.9%: Qty: 3000 | Status: AC

## 2013-04-30 NOTE — Progress Notes (Signed)
Patient off all antihypertensives with BP within parameters, will attempt to wean to extubate.

## 2013-04-30 NOTE — Procedures (Signed)
Extubation Procedure Note  Patient Details:   Name: Russell Martin DOB: 1951-07-23 MRN: [id number]   Airway Documentation:     Evaluation  O2 sats: stable throughout Complications: No apparent complications Patient did tolerate procedure well. Bilateral Breath Sounds: Clear;Diminished Suctioning: Airway Yes  Patient extubated to 4LNC.  Patient orally and ETT suctioned prior to extubation.  Pt introduced to IS/deep breathing and cough post-extubation.  No stridor noted post-extubation.  Positive cuff leak noted.  NIF -40 VC 1.5L  Malachi Paradise 04/30/2013, 4:34 AM

## 2013-04-30 NOTE — Plan of Care (Signed)
Problem: Phase I Progression Outcomes Goal: Initial discharge plan identified Outcome: Completed/Met Date Met:  04/30/13 Home with wife

## 2013-04-30 NOTE — Progress Notes (Signed)
Echo Lab  2D Echocardiogram completed.  Asheton Viramontes L Josseline Reddin, RDCS 04/30/2013 1:07 PM

## 2013-04-30 NOTE — Progress Notes (Signed)
      [address]       [name] [address]             [phone]        CARDIOTHORACIC SURGERY PROGRESS NOTE   R1 Day Post-Op Procedure(s) (LRB): MINIMALLY INVASIVE MITRAL VALVE REPAIR (MVR) (Right) MINIMALLY INVASIVE TRICUSPID VALVE REPAIR (Right) INTRAOPERATIVE TRANSESOPHAGEAL ECHOCARDIOGRAM (N/A)  Subjective: Feels remarkably well.  No pain.  No SOB.  No nausea.  Objective: Vital signs: BP Readings from Last 1 Encounters:  04/30/13 139/50   Pulse Readings from Last 1 Encounters:  04/30/13 77   Resp Readings from Last 1 Encounters:  04/30/13 18   Temp Readings from Last 1 Encounters:  04/30/13 101.3 F (38.5 C) Oral    Hemodynamics: PAP: (9-22)/(5-15) 9/7 mmHg CVP:  [6 mmHg-13 mmHg] 11 mmHg CO:  [4.9 L/min] 4.9 L/min CI:  [2.4 L/min/m2] 2.4 L/min/m2  Physical Exam:  Rhythm:   Sinus w/ V-pacing  Breath sounds: clar  Heart sounds:  RRR w/ II/VI systolic murmur  Incisions:  Dressings dry, intact  Abdomen:  Soft, non-distended, non-tender  Extremities:  Warm, well-perfused    Intake/Output from previous day: 08/20 0701 - 08/21 0700 In: 7353.3 [I.V.:5513.3; Blood:400; NG/GT:30; IV Piggyback:1410] Out: 6075 [Urine:4775; Blood:800; Chest Tube:500] Intake/Output this shift:    Lab Results:  Recent Labs  04/29/13 2230 04/29/13 2318 04/30/13 0441  WBC 12.2*  --  12.4*  HGB 9.9* 10.5* 9.1*  HCT 29.2* 31.0* 27.3*  PLT 98*  --  113*   BMET:  Recent Labs  04/27/13 1517  04/29/13 2318 04/30/13 0441  NA 135  < > 141 141  K 4.2  < > 4.5 4.3  CL 102  --  107 109  CO2 20  --   --  24  GLUCOSE 111*  < > 162* 139*  BUN 20  --  14 14  CREATININE 1.29  < > 1.30 1.43*  CALCIUM 9.7  --   --  8.5  < > = values in this interval not displayed.  CBG (last 3)   Recent Labs  04/29/13 2102 04/29/13 2349 04/30/13 0442  GLUCAP 117* 141* 118*   ABG    Component Value Date/Time   PHART 7.329* 04/30/2013 0520   HCO3 23.5 04/30/2013 0520     TCO2 25 04/30/2013 0520   ACIDBASEDEF 2.0 04/30/2013 0520   O2SAT 95.0 04/30/2013 0520   CXR: Mild atelectasis and CHF  Assessment/Plan: S/P Procedure(s) (LRB): MINIMALLY INVASIVE MITRAL VALVE REPAIR (MVR) (Right) MINIMALLY INVASIVE TRICUSPID VALVE REPAIR (Right) INTRAOPERATIVE TRANSESOPHAGEAL ECHOCARDIOGRAM (N/A)  Doing well POD1 Maintaining NSR/paced rhythm  Hypertensive earlier, now stable off all drips except low dose milrinone Expected post op acute blood loss anemia, mild, stable Expected post op volume excess, mild, diuresing Post op thrombocytopenia, mild   Mobilize  Diuresis  Reprogram BiV pacer  Start coumadin slowly  Check ECHO  OWEN,CLARENCE H 04/30/2013 7:38 AM

## 2013-04-30 NOTE — Progress Notes (Signed)
MD notified at 2200 of systolic BP >200.  Orders received for nipride drip.  Drip received and started at 2300, pt BP sustained greater than 200 prior to starting the drip despite maximum dose of nitroglycerin drip and frequent labetalol doses.  Pt continued to have significant hypertension on both the nitroglycerin and nipride drips so the precedex drip was restarted along with prn pain and sedation medication.   Pt BP significantly improved after adequate sedation achieved.  Will continue to closely monitor patient's blood pressure and assess for appropriateness for extubation.

## 2013-05-01 ENCOUNTER — Inpatient Hospital Stay (HOSPITAL_COMMUNITY): Payer: Federal, State, Local not specified - PPO

## 2013-05-01 ENCOUNTER — Encounter (HOSPITAL_COMMUNITY): Payer: Self-pay | Admitting: Thoracic Surgery (Cardiothoracic Vascular Surgery)

## 2013-05-01 DIAGNOSIS — I059 Rheumatic mitral valve disease, unspecified: Principal | ICD-10-CM

## 2013-05-01 DIAGNOSIS — I5022 Chronic systolic (congestive) heart failure: Secondary | ICD-10-CM

## 2013-05-01 DIAGNOSIS — I079 Rheumatic tricuspid valve disease, unspecified: Secondary | ICD-10-CM

## 2013-05-01 DIAGNOSIS — Z9889 Other specified postprocedural states: Secondary | ICD-10-CM

## 2013-05-01 LAB — GLUCOSE, CAPILLARY
Glucose-Capillary: 125 mg/dL — ABNORMAL HIGH (ref 70–99)
Glucose-Capillary: 121 mg/dL — ABNORMAL HIGH (ref 70–99)
Glucose-Capillary: 107 mg/dL — ABNORMAL HIGH (ref 70–99)

## 2013-05-01 LAB — BASIC METABOLIC PANEL
BUN: 14 mg/dL (ref 6–23)
Chloride: 103 mEq/L (ref 96–112)
Creatinine, Ser: 1.31 mg/dL (ref 0.50–1.35)
GFR calc Af Amer: 67 mL/min — ABNORMAL LOW (ref >90)
GFR calc non Af Amer: 58 mL/min — ABNORMAL LOW (ref >90)
Glucose, Bld: 137 mg/dL — ABNORMAL HIGH (ref 70–99)
Potassium: 3.8 mEq/L (ref 3.5–5.1)

## 2013-05-01 LAB — CBC
HCT: 27.5 % — ABNORMAL LOW (ref 39.0–52.0)
Hemoglobin: 9.2 g/dL — ABNORMAL LOW (ref 13.0–17.0)
MCH: 27.5 pg (ref 26.0–34.0)
MCHC: 33.5 g/dL (ref 30.0–36.0)
RDW: 14.6 % (ref 11.5–15.5)

## 2013-05-01 MED ORDER — POTASSIUM CHLORIDE 10 MEQ/50ML IV SOLN
INTRAVENOUS | Status: AC
  Filled 2013-05-01: qty 150

## 2013-05-01 MED ORDER — FUROSEMIDE 10 MG/ML IJ SOLN
20.0000 mg | Freq: Four times a day (QID) | INTRAMUSCULAR | Status: AC
  Administered 2013-05-01 (×3): 20 mg via INTRAVENOUS
  Filled 2013-05-01: qty 2

## 2013-05-01 MED ORDER — FUROSEMIDE 10 MG/ML IJ SOLN
INTRAMUSCULAR | Status: AC
  Filled 2013-05-01: qty 4

## 2013-05-01 MED ORDER — POTASSIUM CHLORIDE CRYS ER 20 MEQ PO TBCR
20.0000 meq | EXTENDED_RELEASE_TABLET | Freq: Two times a day (BID) | ORAL | Status: DC
  Filled 2013-05-01 (×2): qty 1

## 2013-05-01 MED ORDER — LOSARTAN POTASSIUM 25 MG PO TABS
25.0000 mg | ORAL_TABLET | Freq: Every day | ORAL | Status: DC
  Administered 2013-05-01 – 2013-05-02 (×2): 25 mg via ORAL
  Filled 2013-05-01 (×3): qty 1

## 2013-05-01 MED ORDER — FUROSEMIDE 10 MG/ML IJ SOLN
INTRAMUSCULAR | Status: AC
  Filled 2013-05-01: qty 2

## 2013-05-01 MED ORDER — TRAMADOL HCL 50 MG PO TABS: 50.0000 mg | ORAL_TABLET | ORAL | Status: DC | PRN

## 2013-05-01 MED ORDER — SODIUM CHLORIDE 0.9 % IJ SOLN
3.0000 mL | Freq: Two times a day (BID) | INTRAMUSCULAR | Status: DC
  Administered 2013-05-01: 3 mL via INTRAVENOUS

## 2013-05-01 MED ORDER — POTASSIUM CHLORIDE 10 MEQ/50ML IV SOLN
10.0000 meq | INTRAVENOUS | Status: AC
  Administered 2013-05-01 (×3): 10 meq via INTRAVENOUS

## 2013-05-01 MED ORDER — MOVING RIGHT ALONG BOOK
Freq: Once | Status: AC
  Administered 2013-05-01: 08:00:00
  Filled 2013-05-01: qty 1

## 2013-05-01 MED ORDER — SODIUM CHLORIDE 0.9 % IJ SOLN: 3.0000 mL | INTRAMUSCULAR | Status: DC | PRN

## 2013-05-01 MED ORDER — SODIUM CHLORIDE 0.9 % IV SOLN: 250.0000 mL | INTRAVENOUS | Status: DC | PRN

## 2013-05-01 MED ORDER — FUROSEMIDE 40 MG PO TABS
40.0000 mg | ORAL_TABLET | Freq: Two times a day (BID) | ORAL | Status: DC
  Administered 2013-05-02: 40 mg via ORAL
  Filled 2013-05-01 (×3): qty 1

## 2013-05-01 NOTE — Progress Notes (Addendum)
[address]       [place] [address]             [phone]      2 Days Post-Op Procedure(s) (LRB): MINIMALLY INVASIVE MITRAL VALVE REPAIR (MVR) (Right) MINIMALLY INVASIVE TRICUSPID VALVE REPAIR (Right) INTRAOPERATIVE TRANSESOPHAGEAL ECHOCARDIOGRAM (N/A)  Subjective:  Patient is without complaints this morning.  + Ambulation, No BM  Objective: Vital signs in last 24 hours: Temp:  [98.1 F (36.7 C)-100.5 F (38.1 C)] 99.4 F (37.4 C) (08/22 0400) Pulse Rate:  [59-80] 71 (08/22 0700) Cardiac Rhythm:  [-] A-V Sequential paced (08/22 0700) Resp:  [0-32] 0 (08/22 0700) BP: (113-180)/(52-90) 145/72 mmHg (08/22 0700) SpO2:  [91 %-100 %] 95 % (08/22 0700) Arterial Line BP: (133)/(53) 133/53 mmHg (08/21 0800) FiO2 (%):  [2 %] 2 % (08/22 0400) Weight:  [190 lb 11.2 oz (86.5 kg)] 190 lb 11.2 oz (86.5 kg) (08/22 0500)  Hemodynamic parameters for last 24 hours: CVP:  [6 mmHg] 6 mmHg  Intake/Output from previous day: 08/21 0701 - 08/22 0700 In: 1729.8 [P.O.:920; I.V.:709.8; IV Piggyback:100] Out: 2536 [UYQIH:4742; Chest Tube:500]  General appearance: alert, cooperative and no distress Heart: regular rate and rhythm Lungs: diminished breath sounds bibasilar Abdomen: soft, non-tender; bowel sounds normal; no masses,  no organomegaly Extremities: edema trace Wound: clean and dry  Lab Results:  Recent Labs  04/30/13 1600 04/30/13 1621 05/01/13 0422  WBC 16.6*  --  16.9*  HGB 10.0* 10.9* 9.2*  HCT 30.5* 32.0* 27.5*  PLT 113*  --  106*   BMET:  Recent Labs  04/30/13 0441  04/30/13 1621 05/01/13 0422  NA 141  --  142 138  K 4.3  --  3.9 3.8  CL 109  --  105 103  CO2 24  --   --  30  GLUCOSE 139*  --  138* 137*  BUN 14  --  14 14  CREATININE 1.43*  < > 1.40* 1.31  CALCIUM 8.5  --   --  8.5  < > = values in this interval not displayed.  PT/INR:  Recent Labs  05/01/13 0422  LABPROT 17.9*  INR 1.52*   ABG    Component Value Date/Time     PHART 7.329* 04/30/2013 0520   HCO3 23.5 04/30/2013 0520   TCO2 25 04/30/2013 1621   ACIDBASEDEF 2.0 04/30/2013 0520   O2SAT 95.0 04/30/2013 0520   CBG (last 3)   Recent Labs  04/30/13 1928 04/30/13 2351 05/01/13 0402  GLUCAP 146* 93 125*    Transthoracic Echocardiography  Patient: Russell Martin, Russell Martin MR #: [id number] Study Date: 04/30/2013 Gender: M Age: 62 Height: Weight: 89.1kg BSA: Pt. Status: Room: 2S12C  PERFORMING Maryjean Ka Rutherford Limerick MD SONOGRAPHER [facility], RDCS cc:  ------------------------------------------------------------ LV EF: 50% - 55%  ------------------------------------------------------------ Indications: Murmur 785.2.  ------------------------------------------------------------ History: PMH: Congestive heart failure. Risk factors: Hypertension. Diabetes mellitus.  ------------------------------------------------------------ Study Conclusions  - Left ventricle: The cavity size was mildly dilated. Wall thickness was increased in a pattern of mild LVH. Systolic function was normal. The estimated ejection fraction was in the range of 50% to 55%. There is akinesis of the basal posterior wall. - Aortic valve: Mild regurgitation. - Mitral valve: Prior procedures included surgical repair. Valve area by pressure half-time: 2.18cm^2. - Left atrium: The atrium was mildly dilated. - Tricuspid valve: Prior procedures included surgical repair. - Pulmonary arteries: Systolic pressure was mildly increased. - Pericardium, extracardiac: There was  a left pleural effusion. Impressions:  - Previous mitral and tricuspid valve repair; no significant MR/MS; trace TR; mildly elevated LVOT gradient of 2.5 m/s (no SAM noted and aortic valve opens well).  ------------------------------------------------------------ Labs, prior tests, procedures, and surgery: Valve surgery (current admission, April 29, 2013). Mitral valve  repair.  Valve surgery (current admission, April 29, 2013). Tricuspid valve repair. Transthoracic echocardiography. M-mode, complete 2D, spectral Doppler, and color Doppler. Weight: Weight: 89.1kg. Weight: 196lb. Patient status: Inpatient. Location: ICU/CCU  ------------------------------------------------------------  ------------------------------------------------------------ Left ventricle: The cavity size was mildly dilated. Wall thickness was increased in a pattern of mild LVH. Systolic function was normal. The estimated ejection fraction was in the range of 50% to 55%. Regional wall motion abnormalities: There is akinesis of the basal posterior wall.  ------------------------------------------------------------ Aortic valve: Trileaflet; normal thickness leaflets. Mobility was not restricted. Doppler: There was no stenosis. Mild regurgitation. Mean gradient: 12mm Hg (S). Peak gradient: 25mm Hg (S).  ------------------------------------------------------------ Aorta: Aortic root: The aortic root was normal in size.  ------------------------------------------------------------ Mitral valve: Prior procedures included surgical repair. Mobility was not restricted. Doppler: Transvalvular velocity was within the normal range. There was no evidence for stenosis. No regurgitation. Valve area by pressure half-time: 2.18cm^2.  ------------------------------------------------------------ Left atrium: The atrium was mildly dilated.  ------------------------------------------------------------ Right ventricle: The cavity size was normal. Pacer wire or catheter noted in right ventricle. Systolic function was normal.  ------------------------------------------------------------ Pulmonic valve: Doppler: Transvalvular velocity was within the normal range. There was no evidence for stenosis. Trivial  regurgitation.  ------------------------------------------------------------ Tricuspid valve: Prior procedures included surgical repair. Doppler: Transvalvular velocity was within the normal range. Trivial regurgitation.  ------------------------------------------------------------ Pulmonary artery: Systolic pressure was mildly increased.  ------------------------------------------------------------ Right atrium: The atrium was normal in size. Pacer wire or catheter noted in right atrium.  ------------------------------------------------------------ Pericardium: There was no pericardial effusion.  ------------------------------------------------------------ Systemic veins: Inferior vena cava: The vessel was normal in size.  ------------------------------------------------------------ Pleura: There was a left pleural effusion.  ------------------------------------------------------------  2D measurements Normal Doppler measurements Normal Left ventricle Aortic valve LVID ED, 53.9 mm 43-52 Peak vel, S 248 cm/s ------ chord, PLAX Mean vel, S 152 cm/s ------ LVID ES, 47.4 mm 23-38 VTI, S 37.8 cm ------ chord, PLAX Mean 12 mm ------ FS, chord, 12 % >29 gradient, S Hg PLAX Peak 25 mm ------ LVPW, ED 11 mm ------ gradient, S Hg IVS/LVPW 1.2 <1.3 Regurg PHT 669 ms ------ ratio, ED Mitral valve Ventricular septum Pressure 101 ms ------ IVS, ED 13.2 mm ------ half-time Aorta Area (PHT) 2.18 cm^2 ------ Root diam, 32 mm ------ Tricuspid valve ED Regurg peak 276 cm/s ------ Left atrium vel AP dim 45 mm ------ Peak RV-RA 30 mm ------ gradient, S Hg Max regurg 276 cm/s ------ vel  ------------------------------------------------------------ Prepared and Electronically Authenticated by  Olga Millers 2014-08-21T17:02:[id number]     Assessment/Plan: S/P Procedure(s) (LRB): MINIMALLY INVASIVE MITRAL VALVE REPAIR (MVR) (Right) MINIMALLY INVASIVE TRICUSPID VALVE REPAIR  (Right) INTRAOPERATIVE TRANSESOPHAGEAL ECHOCARDIOGRAM (N/A)  1. CV- NSR, paced remains hypertensive on NTG, Amiodarone, Digoxin, may benefit from restarting home Cozaar 2. Pulm- + bilateral atelectasis, encouraged use of IS 3. Renal- creatinine at 1.31, appears to be at baseline, continue Lasix 4. INR 1.52- will continue Coumadin at 2.5 mg daily 5. Expected Acute Blood Loss Anemia- mild Hgb stable at 9.1 6. CBGs controlled, not a diabetic will continue SSIP for now 7. Dispo- leave chest tubes in today, patient is stable transfer to 2000   LOS: 2 days    [place], [place]  05/01/2013   I have seen and examined the patient and agree with the assessment and plan as outlined.  Doing remarkably well. ECHO looks good.  Systolic murmur due to LVOT turbulunce and high flow.  Maintaining NSR w/ biV pacing.  Restart Cozaar.  D/C pacing wires.  Leave chest tubes one more day.  Transfer step down.  Possible d/c home Sunday or Monday.    OWEN,CLARENCE H 05/01/2013 8:02 AM

## 2013-05-01 NOTE — Consult Note (Signed)
Advanced Heart Failure Team Consult Note  Referring Physician: Dr. Cornelius Moras Primary Physician: Dr. Jillyn Hidden Primary Cardiologist:  Dr. Gala Romney EP: Dr. Ladona Ridgel  Reason for Consultation: HF  HPI:    Mr Province is a 62 year old with PMH of systolic HF due to NICM (EF 35%) s/p BiVICD, complete heart block 2001 S/P permanent pacemaker, gout, HTN and hyperlipidemia.  01/27/13 ECHO EF 45-45% Severe MR Mod-Severe TR  He is s/p MV repair and TV repair by Dr. Cornelius Moras on 04/29/13. Doing great and ambulating around the halls with no SOB, DOE or orthopnea. Weight down 6 lbs from yesterday and off milrinone.   04/30/13 ECHO EF 50-55% with no significant MR and trace TR, mildly elevated LVOT gradient of 2.5.  Review of Systems: [y] = yes, [ ]  = no   General: Weight gain [Y ]; Weight loss [ ] ; Anorexia [ ] ; Fatigue [ ] ; Fever [ ] ; Chills [ ] ; Weakness [ ]   Cardiac: Chest pain/pressure [ ] ; Resting SOB Klaus.Mock ]; Exertional SOB Klaus.Mock ]; Myer Peer ]; Pedal Edema [ ] ; Palpitations [ ] ; Syncope [ ] ; Presyncope [ ] ; Paroxysmal nocturnal dyspnea[ ]   Pulmonary: Cough [ ] ; Wheezing[ ] ; Hemoptysis[ ] ; Sputum [ ] ; Snoring [ ]   GI: Vomiting[ ] ; Dysphagia[ ] ; Melena[ ] ; Hematochezia [ ] ; Heartburn[ ] ; Abdominal pain [ ] ; Constipation [ ] ; Diarrhea [ ] ; BRBPR [ ]   GU: Hematuria[ ] ; Dysuria [ ] ; Nocturia[ ]   Vascular: Pain in legs with walking [ ] ; Pain in feet with lying flat [ ] ; Non-healing sores [ ] ; Stroke [ ] ; TIA [ ] ; Slurred speech [ ] ;  Neuro: Headaches[ ] ; Vertigo[ ] ; Seizures[ ] ; Paresthesias[ ] ;Blurred vision [ ] ; Diplopia [ ] ; Vision changes [ ]   Ortho/Skin: Arthritis [ ] ; Joint pain [ ] ; Muscle pain [ ] ; Joint swelling [ ] ; Back Pain [ ] ; Rash [ ]   Psych: Depression[ ] ; Anxiety[ ]   Heme: Bleeding problems [ ] ; Clotting disorders [ ] ; Anemia [ ]   Endocrine: Diabetes [ ] ; Thyroid dysfunction[ ]   Home Medications Prior to Admission medications   Medication Sig Start Date End Date Taking? Authorizing Provider   amiodarone (PACERONE) 200 MG tablet Take 200 mg by mouth 2 (two) times daily.   Yes Historical Provider, MD  aspirin EC 81 MG tablet Take 81 mg by mouth daily.   Yes Historical Provider, MD  carvedilol (COREG) 12.5 MG tablet Take 12.5 mg by mouth daily.   Yes Historical Provider, MD  digoxin (LANOXIN) 0.125 MG tablet Take 0.125 mg by mouth daily.   Yes Historical Provider, MD  furosemide (LASIX) 20 MG tablet Take 20 mg by mouth 2 (two) times daily.   Yes Historical Provider, MD  losartan (COZAAR) 25 MG tablet Take 25 mg by mouth daily.   Yes Historical Provider, MD  Multiple Vitamin (MULTIVITAMIN WITH MINERALS) TABS Take 1 tablet by mouth daily.   Yes Historical Provider, MD  niacin 500 MG tablet Take 500 mg by mouth daily with breakfast.   Yes Historical Provider, MD  simvastatin (ZOCOR) 80 MG tablet Take 80 mg by mouth at bedtime.   Yes Historical Provider, MD    Past Medical History: Past Medical History  Diagnosis Date  . CHF (congestive heart failure)     a. NICM EF 35% - no CAD by cath 09/2012. b. s/p upgrade to Bi-V pacemaker 11/2012 (did not get ICD as Dr. Ladona Ridgel expects his EF will improve with bi-V pacing).  . Hyperlipidemia   .  Hypertension     pt denies HTN  . CHB (complete heart block)     a. Pacer initially placed ~2002.   . Mitral valve regurgitation   . Pacemaker     st jude    greg taylor  . S/P mitral valve repair 04/29/2013    28mm Sorin Memo 3D ring annuloplasty via right mini thoracotomy  . S/P tricuspid valve repair 04/29/2013    28 mm Edwards mc3 ring annuloplasty via right mini thoracotomy    Past Surgical History: Past Surgical History  Procedure Laterality Date  . Pacemaker placement    . Cardiac catheterization      clean  . Multiple extractions with alveoloplasty N/A 03/20/2013    Procedure: Extraction of tooth #'s 1,6,7,8,9,10,16,17,20,21,22,23,24,25,26,27,28, 29 with alveoloplasty;  Surgeon: [name], DDS;  Location: [facility] OR;  Service: Oral  Surgery;  Laterality: N/A;  . Tee without cardioversion N/A 04/09/2013    Procedure: TRANSESOPHAGEAL ECHOCARDIOGRAM (TEE);  Surgeon: Dolores Patty, MD;  Location: [facility] ENDOSCOPY;  Service: Cardiovascular;  Laterality: N/A;    Family History: History reviewed. No pertinent family history.  Social History: History   Social History  . Marital Status: Married    Spouse Name: N/A    Number of Children: 1  . Years of Education: N/A   Occupational History  .     Social History Main Topics  . Smoking status: Former Games developer  . Smokeless tobacco: Never Used  . Alcohol Use: No  . Drug Use: No  . Sexual Activity: Yes   Other Topics Concern  . None   Social History Narrative   Patient is married. Patient has one grown son that lives in [place].   Patient with a history of smoking but quit approximately 10 years ago. Patient denies use of alcohol or other illicit drugs. Patient has never used smokeless tobacco.    Allergies:  No Known Allergies  Objective:    Vital Signs:   Temp:  [98.1 F (36.7 C)-100.5 F (38.1 C)] 99.4 F (37.4 C) (08/22 0400) Pulse Rate:  [59-80] 71 (08/22 0700) Resp:  [0-32] 0 (08/22 0700) BP: (113-180)/(52-90) 145/72 mmHg (08/22 0700) SpO2:  [91 %-100 %] 95 % (08/22 0700) Arterial Line BP: (133)/(53) 133/53 mmHg (08/21 0800) FiO2 (%):  [2 %] 2 % (08/22 0400) Weight:  [190 lb 11.2 oz (86.5 kg)] 190 lb 11.2 oz (86.5 kg) (08/22 0500)    Weight change: Filed Weights   04/28/13 1300 04/30/13 0500 05/01/13 0500  Weight: 183 lb (83.008 kg) 196 lb 3.4 oz (89 kg) 190 lb 11.2 oz (86.5 kg)    Intake/Output:   Intake/Output Summary (Last 24 hours) at 05/01/13 0744 Last data filed at 05/01/13 0700  Gross per 24 hour  Intake 1729.75 ml  Output   4615 ml  Net -2885.25 ml     Physical Exam: General:  Well appearing. No resp difficulty, sitting in chair HEENT: normal Neck: supple. JVP 7 . Carotids 2+ bilat; no bruits. No lymphadenopathy or  thryomegaly appreciated. RIJ Cor: PMI nondisplaced. Regular rate & rhythm. No rubs, gallops or murmurs.  Lungs: clear Abdomen: soft, nontender, nondistended. No hepatosplenomegaly. No bruits or masses. Good bowel sounds. Extremities: no cyanosis, clubbing, rash, edema Neuro: alert & orientedx3, cranial nerves grossly intact. moves all 4 extremities w/o difficulty. Affect pleasant  Telemetry: AV paced 75  Labs: Basic Metabolic Panel:  Recent Labs Lab 04/27/13 1517  04/29/13 1639 04/29/13 2230 04/29/13 2318 04/30/13 0441 04/30/13 1600 04/30/13  1621 05/01/13 0422  NA 135  < > 141  --  141 141  --  142 138  K 4.2  < > 4.2  --  4.5 4.3  --  3.9 3.8  CL 102  --   --   --  107 109  --  105 103  CO2 20  --   --   --   --  24  --   --  30  GLUCOSE 111*  < > 100*  --  162* 139*  --  138* 137*  BUN 20  --   --   --  14 14  --  14 14  CREATININE 1.29  --   --  1.27 1.30 1.43* 1.41* 1.40* 1.31  CALCIUM 9.7  --   --   --   --  8.5  --   --  8.5  MG  --   --   --  2.1  --  1.9 2.0  --   --   < > = values in this interval not displayed.  Liver Function Tests:  Recent Labs Lab 04/27/13 1517  AST 29  ALT 17  ALKPHOS 74  BILITOT 0.5  PROT 7.2  ALBUMIN 3.7   No results found for this basename: LIPASE, AMYLASE,  in the last 168 hours No results found for this basename: AMMONIA,  in the last 168 hours  CBC:  Recent Labs Lab 04/29/13 1615  04/29/13 2230 04/29/13 2318 04/30/13 0441 04/30/13 1600 04/30/13 1621 05/01/13 0422  WBC 7.5  --  12.2*  --  12.4* 16.6*  --  16.9*  HGB 11.3*  < > 9.9* 10.5* 9.1* 10.0* 10.9* 9.2*  HCT 33.5*  < > 29.2* 31.0* 27.3* 30.5* 32.0* 27.5*  MCV 81.5  --  81.8  --  82.2 83.1  --  82.3  PLT 112*  --  98*  --  113* 113*  --  106*  < > = values in this interval not displayed.  Cardiac Enzymes: No results found for this basename: CKTOTAL, CKMB, CKMBINDEX, TROPONINI,  in the last 168 hours  BNP: BNP (last 3 results)  Recent Labs  08/26/12 0228   PROBNP 896.6*    CBG:  Recent Labs Lab 04/30/13 1200 04/30/13 1536 04/30/13 1928 04/30/13 2351 05/01/13 0402  GLUCAP 133* 117* 146* 93 125*    Coagulation Studies:  Recent Labs  04/29/13 1615 05/01/13 0422  LABPROT 16.7* 17.9*  INR 1.39 1.52*    Other results:  Imaging: Dg Chest Port 1 View  05/01/2013   *RADIOLOGY REPORT*  Clinical Data: Status post cardiac surgery.  PORTABLE CHEST - 1 VIEW  Comparison: 04/30/2013  Findings: The pacing device is again seen.  A Cordis sheath is seen although the central catheter has been removed.  A right-sided thoracostomy catheter is noted both superiorly and inferiorly.  No pneumothorax is noted.  The left lung is clear.  Right basilar atelectasis is noted.  IMPRESSION: Slight increase in right basilar atelectasis.  Remainder of the exam is stable.   Original Report Authenticated By: Alcide Clever, M.D.   Dg Chest Portable 1 View In Am  04/30/2013   *RADIOLOGY REPORT*  Clinical Data: Postop CABG  PORTABLE CHEST - 1 VIEW  Comparison: 04/29/2013  Findings: Right jugular central venous catheter tip in the right innominate vein.  Left jugular catheter tip in the left innominate vein.  Right chest tube in place.  Negative for pneumothorax.  Progression of bibasilar  airspace disease which may be atelectasis or infiltrate.  No significant effusion.  IMPRESSION: Progression of bibasilar airspace disease.  No pneumothorax.   Original Report Authenticated By: Janeece Riggers, M.D.   Dg Chest Portable 1 View  04/29/2013   *RADIOLOGY REPORT*  Clinical Data: Postop cardiac surgery  PORTABLE CHEST - 1 VIEW  Comparison: Portable exam 1633 hours compared to 04/27/2013  Findings: Tip of endotracheal tube projects approximately 6.3 cm above carina. Nasogastric tube extends into stomach. Left subclavian transvenous pacemaker leads project over right atrium, right ventricle, and coronary sinus. External pacing leads and epicardial pacing leads present. Left jugular  central venous catheter tip projects superior to the dorsal margin of the aortic arch question left brachiocephalic vein confluence. Right jugular central venous catheter tip projects over SVC.  Enlargement of cardiac silhouette post mitral valve repair. Patchy right perihilar and basilar infiltrates. Mild peribronchial thickening. No gross pleural effusion or pneumothorax.  IMPRESSION: Postoperative changes as above.   Original Report Authenticated By: Ulyses Southward, M.D.      Medications:     Current Medications: . acetaminophen  1,000 mg Oral Q6H  . amiodarone  200 mg Oral BID  . aspirin EC  81 mg Oral Daily  . atorvastatin  40 mg Oral q1800  . bisacodyl  10 mg Oral Daily   Or  . bisacodyl  10 mg Rectal Daily  . carvedilol  12.5 mg Oral Daily  . coumadin book   Does not apply Once  . digoxin  0.125 mg Oral Daily  . docusate sodium  200 mg Oral Daily  . insulin aspart  0-24 Units Subcutaneous Q4H  . multivitamin with minerals  1 tablet Oral Daily  . niacin  500 mg Oral Q breakfast  . pantoprazole  40 mg Oral Daily  . sodium chloride  3 mL Intravenous Q12H  . warfarin  2.5 mg Oral q1800  . warfarin   Does not apply Once  . Warfarin - Physician Dosing Inpatient   Does not apply q1800     Infusions: . sodium chloride    . lactated ringers 20 mL/hr at 05/01/13 0558  . milrinone Stopped (05/01/13 0500)  . nitroGLYCERIN 10 mcg/min (05/01/13 0700)  . nitroPRUSSide Stopped (04/30/13 0445)       Assessment Plan/Discussion:    1) MR - Doing great s/p MV repair. No SOB, DOE or edema  2) TR - s/p TR repair  3) Chronic systolic HF, EF 55% (04/2013) - NYHA II symptoms and ambulating in the halls with no difficulties. - SBP remains elevated on nitro gtt at 10 mcg/min. Will start ARB back to help with BP. - Volume slightly elevated will give 40 mg IV lasix today.   Length of Stay: 2  Aundria Rud 05/01/2013, 7:44 AM  Advanced Heart Failure Team Pager [phone] (M-F; 7a -  4p)  Please contact [facility] for night-coverage after hours (4p -7a ) and weekends on [email or web address]  Patient seen and examined with Ulla Potash, NP. We discussed all aspects of the encounter. I agree with the assessment and plan as stated above.   Doing very well post-op. Echo looks good. Still 7 pounds up from baseline. Diurese as tolerated. Continue ambulation.  Inessa Wardrop,MD 2:51 PM

## 2013-05-01 NOTE — Progress Notes (Signed)
CARDIAC REHAB PHASE I   PRE:  Rate/Rhythm: 67paced  BP:  Supine: 111/56  Sitting:   Standing:    SaO2: 97%RA  MODE:  Ambulation: 550 ft   POST:  Rate/Rhythm: 73paced  BP:  Supine: 145/64  Sitting:   Standing:    SaO2: 93%RA 1335-1410 Pt walked 550 ft on RA with rolling walker and asst x 1 with steady gait. Tolerated well. Pt tolerated increase in distance well. Wanted to go to bed for nap.  Call bell in reach. Equipment intact.   Luetta Nutting, RN BSN  05/01/2013 2:09 PM

## 2013-05-01 NOTE — Plan of Care (Signed)
Problem: Phase III Progression Outcomes Goal: Time patient transferred to PCTU/Telemetry POD Outcome: Completed/Met Date Met:  05/01/13 Transferred to 1Y78G via wheelchair and propak.  Tolerated transfer well.  Vitals stable upon discharge.   Goal: Discharge plan remains appropriate-arrangements made Outcome: Completed/Met Date Met:  05/01/13 Discharge home with wife

## 2013-05-01 NOTE — Discharge Summary (Signed)
Physician Discharge Summary  Patient ID: Russell Martin MRN: [id number] DOB/AGE: Nov 10, 1950 62 y.o.  Admit date: 04/29/2013 Discharge date: 05/01/2013  Admission Diagnoses:  Patient Active Problem List   Diagnosis Date Noted  . MR (mitral regurgitation) 03/11/2013  . Tricuspid valve regurgitation 03/11/2013  . Mitral regurgitation 03/03/2013  . Chronic systolic heart failure 10/21/2012  . Complete heart block 10/21/2012  . Essential hypertension 10/21/2012  . Pacemaker 10/21/2012   Discharge Diagnoses:   Patient Active Problem List   Diagnosis Date Noted  . S/P mitral valve repair 04/29/2013  . S/P tricuspid valve repair 04/29/2013  . MR (mitral regurgitation) 03/11/2013  . Tricuspid valve regurgitation 03/11/2013  . Mitral regurgitation 03/03/2013  . Chronic systolic heart failure 10/21/2012  . Complete heart block 10/21/2012  . Essential hypertension 10/21/2012  . Pacemaker 10/21/2012   Discharged Condition: good  History Of Present Illness:   Russell Martin is a 62 year old African American male with long-standing history of Dilated nonischemic Cardiomyopathy and chronic combined systolic and diastolic congestive heart failure. The patient states that he first presented in 2001 with complete heart block and underwent placement of a permanent pacemaker. Until recently he had been followed by Dr. Alanda Amass. In 2012 he began to develop progressive symptoms of congestive heart failure. Symptoms gradually progressed until this past December when he presented to the Emergency department with class IV congestive heart failure and pulmonary edema. He responded quickly to diuretic therapy. He ultimately was referred to Dr. Ladona Ridgel for biventricular pacemaker upgrade which was performed in March of this year. With biventricular pacing the patient's symptoms of congestive heart failure had stabilized.  Follow up echocardiogram performed in May revealed the presence of severe mitral regurgitation  and severe tricuspid regurgitation. The patient recently was referred to the congestive heart failure clinic where he was evaluated by Dr. Gala Romney on 03/03/2013. Based upon review of the patient's most recent echocardiogram, Dr. Gala Romney felt that the patient might benefit from elective surgical intervention for mitral and tricuspid regurgitation. The patient was referred for surgical consultation and was evaluated by Dr. Cornelius Moras on March 11, 2013 at which time he agreed the patient would benefit from surgical intervention.  However, at that time he was noted to have poor dentition and would require dental evaluation prior to proceeding with surgery.  After completion of dental work patient again presented for follow up with Dr. Cornelius Moras on 04/20/2013.  At that time the risks and benefits of Minimally invasive Mitral Valve repair were explained to the patient and he was agreeable to proceed.  He was temporarily taken off Zocor and started on Amiodarone for Atrial Fibrillation prophylaxis.   Hospital Course:   The patient presented to [facility] on 04/29/2013.  He was taken to the operating room and underwent Minimally Invasive Mitral Valve Repair utilizing a 28 mm Sorin Memo 3D Ring Annuloplasty, Tricuspid Valve Repair utilizing a 28 mm Edwards MC 3 Ring Annuloplasty.  The patient tolerated the procedure well and was taken to the SICU in stable condition.  During his stay in the ICU the patient was extubated the morning after surgery.  He was weaned off all Cardiac drips as tolerated.  The patient had issues with Hypertension requiring initiation of Nipride drip.  This was weaned down as patient tolerated.  He was maintaining NSR.  His pacing wires were removed without difficulty.  His pacemaker has been interrogated and is functioning properly.  He was transferred to the step down unit in stable condition.  His chest tubes were removed once output had decreased.  The patient continues to progress.  His INR is  trending up and should remain in the therapeutic range of 2.5-3.0.  He is ambulating without difficulty and tolerating a regular diet.  Should no further issues arise we anticipate discharge home in the next 24-48 hours.  He will need to follow up with Dr. Cornelius Moras in 2 weeks with a CXR prior to his appointment.  He will also need to follow up with Dr. Gala Romney in 2-4 weeks.  He will also need to have a PT/INR level drawn 48 hours after discharge   Significant Diagnostic Studies: Echocardiogram  - Left ventricle: Systolic function was normal. The estimated ejection fraction was in the range of 50% to 55%. - Aortic valve: Mild regurgitation originating from the central coaptation point. - Mitral valve: Severe regurgitation directed centrally. - Left atrium: No evidence of thrombus in the atrial cavity or appendage. - Atrial septum: No defect or patent foramen ovale was identified. - Tricuspid valve: Moderate regurgitation directed centrally. - Pulmonic valve: No evidence of vegetation.  Treatments: surgery:   Minimally-Invasive Mitral Valve Repair Sorin Memo 3D Ring Annuloplasty (size 28mm, catalog # [id number], serial # [id number])  Minimally-Invasive Tricuspid Valve Repair Edwards mc3 Ring Annuloplasty (size 28mm, model # [id number], serial # [id number])   Disposition: 01-Home or Self Care  Discharge Medications:    The patient has been discharged on:   1.Beta Blocker:  Yes [   ]                              No   [ x  ]                              If No, reason: No CAD, Heart Block  2.Ace Inhibitor/ARB: Yes [ x  ]                                     No  [    ]                                     If No, reason:  3.Statin:   Yes [ x  ]                  No  [   ]                  If No, reason:  4.Ecasa:  Yes  [  x ]                  No   [   ]                  If No, reason:    Medication List    STOP taking these medications       furosemide 20 MG tablet  Commonly known as:  LASIX       TAKE these medications       amiodarone 200 MG tablet  Commonly known as:  PACERONE  Take 1 tablet (200 mg total) by mouth 2 (two) times daily.     aspirin EC 81 MG tablet  Take  81 mg by mouth daily.     carvedilol 12.5 MG tablet  Commonly known as:  COREG  Take 12.5 mg by mouth daily.     digoxin 0.125 MG tablet  Commonly known as:  LANOXIN  Take 0.125 mg by mouth daily.     losartan 25 MG tablet  Commonly known as:  COZAAR  Take 25 mg by mouth daily.     multivitamin with minerals Tabs tablet  Take 1 tablet by mouth daily.     niacin 500 MG tablet  Take 500 mg by mouth daily with breakfast.     oxyCODONE 5 MG immediate release tablet  Commonly known as:  Oxy IR/ROXICODONE  Take 1-2 tablets (5-10 mg total) by mouth every 4 (four) hours as needed.     simvastatin 80 MG tablet  Commonly known as:  ZOCOR  Take 80 mg by mouth at bedtime.     warfarin 5 MG tablet  Commonly known as:  COUMADIN  Take 1 tablet (5 mg total) by mouth daily at 6 PM.       Follow-up Information   Follow up with [name], MD On 05/18/2013. (Appointment is at 10:00)    Specialty:  Cardiothoracic Surgery   Contact information:   [address] [place] [address] [phone]       Follow up with [facility] On 05/18/2013. (Please get CXR at 9:00)    Contact information:   [place]       Schedule an appointment as soon as possible for a visit with Arvilla Meres, MD. (pleast contact office to set up 2-4 week follow up)    Specialty:  Cardiology   Contact information:   [address] [place] [address] [phone]       Follow up with [facility]. (Please contact office to have PT/INR drawn 48 hours after discharge)    Specialty:  Cardiology   Contact information:   [address], [address] [place] [address] [phone]       Future Appointments Provider Department Dept Phone   05/06/2013 11:00  AM [name], DDS [facility] [phone]   06/01/2013 9:00 AM [facility] [phone]     Signed: Lowella Dandy 05/01/2013, 9:19 AM

## 2013-05-02 LAB — CBC
MCH: 27.4 pg (ref 26.0–34.0)
MCV: 82.5 fL (ref 78.0–100.0)
Platelets: 131 10*3/uL — ABNORMAL LOW (ref 150–400)
RDW: 14.7 % (ref 11.5–15.5)

## 2013-05-02 LAB — GLUCOSE, CAPILLARY
Glucose-Capillary: 100 mg/dL — ABNORMAL HIGH (ref 70–99)
Glucose-Capillary: 122 mg/dL — ABNORMAL HIGH (ref 70–99)

## 2013-05-02 LAB — BASIC METABOLIC PANEL
BUN: 15 mg/dL (ref 6–23)
Calcium: 9 mg/dL (ref 8.4–10.5)
Creatinine, Ser: 1.29 mg/dL (ref 0.50–1.35)
GFR calc Af Amer: 68 mL/min — ABNORMAL LOW (ref >90)

## 2013-05-02 MED ORDER — WARFARIN SODIUM 5 MG PO TABS
5.0000 mg | ORAL_TABLET | Freq: Every day | ORAL | Status: DC
  Administered 2013-05-02 – 2013-05-03 (×2): 5 mg via ORAL
  Filled 2013-05-02 (×3): qty 1

## 2013-05-02 MED ORDER — FUROSEMIDE 40 MG PO TABS
40.0000 mg | ORAL_TABLET | Freq: Every day | ORAL | Status: DC
  Administered 2013-05-03 – 2013-05-04 (×2): 40 mg via ORAL
  Filled 2013-05-02 (×2): qty 1

## 2013-05-02 MED ORDER — POTASSIUM CHLORIDE CRYS ER 20 MEQ PO TBCR
20.0000 meq | EXTENDED_RELEASE_TABLET | Freq: Every day | ORAL | Status: DC
  Administered 2013-05-02 – 2013-05-04 (×3): 20 meq via ORAL
  Filled 2013-05-02 (×2): qty 1

## 2013-05-02 MED ORDER — POTASSIUM CHLORIDE CRYS ER 20 MEQ PO TBCR
20.0000 meq | EXTENDED_RELEASE_TABLET | Freq: Once | ORAL | Status: AC
  Administered 2013-05-02: 20 meq via ORAL

## 2013-05-02 NOTE — Progress Notes (Addendum)
      [address]       [place] [address]             [phone]        3 Days Post-Op Procedure(s) (LRB): MINIMALLY INVASIVE MITRAL VALVE REPAIR (MVR) (Right) MINIMALLY INVASIVE TRICUSPID VALVE REPAIR (Right) INTRAOPERATIVE TRANSESOPHAGEAL ECHOCARDIOGRAM (N/A)  Subjective: Patient without complaints this am.  Objective: Vital signs in last 24 hours: Temp:  [98.9 F (37.2 C)-100.4 F (38 C)] 98.9 F (37.2 C) (08/23 0426) Pulse Rate:  [63-74] 74 (08/23 0426) Cardiac Rhythm:  [-] A-V Sequential paced (08/23 0750) Resp:  [16-22] 20 (08/23 0426) BP: (120-159)/(60-76) 159/76 mmHg (08/23 0426) SpO2:  [95 %-98 %] 98 % (08/23 0426) Weight:  [83.3 kg (183 lb 10.3 oz)-86.5 kg (190 lb 11.2 oz)] 83.3 kg (183 lb 10.3 oz) (08/23 0427)  Pre op weight  83 kg Current Weight  05/02/13 83.3 kg (183 lb 10.3 oz)      Intake/Output from previous day: 08/22 0701 - 08/23 0700 In: 526.8 [P.O.:350; I.V.:26.8; IV Piggyback:150] Out: 4180 [Urine:4050; Chest Tube:130]   Physical Exam:  Cardiovascular: RRR, no murmurs, gallops, or rubs. Pulmonary: Clear to auscultation bilaterally; no rales, wheezes, or rhonchi. Abdomen: Soft, non tender, bowel sounds present. Extremities: No lower extremity edema. Wounds: Clean and dry.  No erythema or signs of infection.  Lab Results: CBC: Recent Labs  05/01/13 0422 05/02/13 0440  WBC 16.9* 14.6*  HGB 9.2* 9.9*  HCT 27.5* 29.8*  PLT 106* 131*   BMET:  Recent Labs  05/01/13 0422 05/02/13 0440  NA 138 139  K 3.8 3.6  CL 103 100  CO2 30 30  GLUCOSE 137* 103*  BUN 14 15  CREATININE 1.31 1.29  CALCIUM 8.5 9.0    PT/INR:  Lab Results  Component Value Date   INR 1.25 05/02/2013   INR 1.52* 05/01/2013   INR 1.39 04/29/2013   ABG:  INR: Will add last result for INR, ABG once components are confirmed Will add last 4 CBG results once components are confirmed  Assessment/Plan:  1. CV - Paced. On Amiodarone 200 bid,  Cozaar 25 daily, Digoxin 0.125 daily, Coreg 12.5 daily, and Coumadin. INR decreased from 1.52 to 1.25. Will increase Coumadin dose. May need to increase Cozaar for better SBP control-monitor.May also need to decrease Amiodarone to 200 daily as HR mostly in 60's of late. 2.  Pulmonary - Chest tubes with 150 cc of output last 24 hours. Will remove EPW and then only anterior tube (pericardial).Encourage incentive spirometer 3. Volume Overload - On Lasix 40 po bid. He is almost at pre op weight so will decrease to daily. 4.  Acute blood loss anemia - H and H stable at 9.9 and 29.8 5.Mild thrombocytopenia-platelets up to 131,000. 6.Supplement potassium  Sameerah Nachtigal MPA-C 05/02/2013,8:49 AM

## 2013-05-02 NOTE — Progress Notes (Signed)
CARDIAC REHAB PHASE I   PRE:  Rate/Rhythm: 74 paced    BP: sitting 158/72    SaO2: 94 RA  MODE:  Ambulation: 850 ft   POST:  Rate/Rhythm: 82 paced    BP: sitting 167/76     SaO2: 92 RA  Tolerated very well with RW. BP slightly high after walk. Return to recliner, pt feels good. Encouraged more walking and IS. 1135-1200   Elissa Lovett [place] CES, ACSM 05/02/2013 11:58 AM

## 2013-05-02 NOTE — Progress Notes (Signed)
EPW removed.  Explained procedure to patient, who tolerated it well.  No arrhythmias noted during removal.  DSD applied over site.  No drainage noted.  Pt understands, via teach-back, that he is on bedrest x 1 hour.

## 2013-05-02 NOTE — Progress Notes (Signed)
Anterior chest tube removed per order and protocol with guidance from ICU RN Melissa.  Pt tolerated very well, no complaints of pain or SOB.  Portable CXR scheduled for tomorrow per PA Doree Fudge.  Will con't plan of care.

## 2013-05-03 ENCOUNTER — Inpatient Hospital Stay (HOSPITAL_COMMUNITY): Payer: Federal, State, Local not specified - PPO

## 2013-05-03 MED ORDER — LOSARTAN POTASSIUM 50 MG PO TABS
50.0000 mg | ORAL_TABLET | Freq: Every day | ORAL | Status: DC
  Administered 2013-05-03: 50 mg via ORAL
  Filled 2013-05-03 (×2): qty 1

## 2013-05-03 NOTE — Progress Notes (Signed)
Right chest tube DC'd per order and unit protocol.  Pt tolerated well.  Occlusive dressing applied.  Pt in bed resting comfortably with wife at bedside and call bell in reach.  Await portable CXR, already contacted.  Will monitor closely.

## 2013-05-03 NOTE — Progress Notes (Addendum)
      [address]       [place] [address]             [phone]        4 Days Post-Op Procedure(s) (LRB): MINIMALLY INVASIVE MITRAL VALVE REPAIR (MVR) (Right) MINIMALLY INVASIVE TRICUSPID VALVE REPAIR (Right) INTRAOPERATIVE TRANSESOPHAGEAL ECHOCARDIOGRAM (N/A)  Subjective: Patient without complaints this am.  Objective: Vital signs in last 24 hours: Temp:  [99.5 F (37.5 C)-100.2 F (37.9 C)] 100 F (37.8 C) (08/24 0420) Pulse Rate:  [68-71] 71 (08/24 0420) Cardiac Rhythm:  [-] A-V Sequential paced (08/24 0826) Resp:  [18-28] 19 (08/24 0420) BP: (125-143)/(56-76) 143/76 mmHg (08/24 0420) SpO2:  [95 %-99 %] 99 % (08/24 0420) Weight:  [82.192 kg (181 lb 3.2 oz)] 82.192 kg (181 lb 3.2 oz) (08/24 0420)  Pre op weight  83 kg Current Weight  05/03/13 82.192 kg (181 lb 3.2 oz)      Intake/Output from previous day: 08/23 0701 - 08/24 0700 In: -  Out: 1900 [Urine:1700; Chest Tube:200]   Physical Exam:  Cardiovascular: RRR, no murmurs, gallops, or rubs. Pulmonary: Clear to auscultation bilaterally; no rales, wheezes, or rhonchi. Abdomen: Soft, non tender, bowel sounds present. Extremities: No lower extremity edema. Wounds: Clean and dry.  No erythema or signs of infection.  Lab Results: CBC:  Recent Labs  05/01/13 0422 05/02/13 0440  WBC 16.9* 14.6*  HGB 9.2* 9.9*  HCT 27.5* 29.8*  PLT 106* 131*   BMET:   Recent Labs  05/01/13 0422 05/02/13 0440  NA 138 139  K 3.8 3.6  CL 103 100  CO2 30 30  GLUCOSE 137* 103*  BUN 14 15  CREATININE 1.31 1.29  CALCIUM 8.5 9.0    PT/INR:  Lab Results  Component Value Date   INR 1.19 05/03/2013   INR 1.25 05/02/2013   INR 1.52* 05/01/2013   ABG:  INR: Will add last result for INR, ABG once components are confirmed Will add last 4 CBG results once components are confirmed  Assessment/Plan:  1. CV - Paced. On Amiodarone 200 bid, Cozaar 25 daily, Digoxin 0.125 daily, Coreg 12.5 daily, and  Coumadin. INR decreased from 1.25 to 1.19. Continue with Coumadin. Will increase Cozaar for better SBP control-monitor. 2.  Pulmonary - Chest tubes recorded as 200 cc of output last 24 hours, but from my mark yesterday it was only 120 cc.CXR this am shows mild right base atelectasis, cardiomegaly, no pneumothorax.Remove remaining chest tube.Encourage incentive spirometer 3. Volume Overload - On Lasix 40 po daily. Is below pre op weight. Will likely not need Lasix upon discharge 4.  Acute blood loss anemia - H and H stable at 9.9 and 29.8 5.Mild thrombocytopenia-platelets up to 131,000. 6.Likely discharge early this week  ZIMMERMAN,DONIELLE MPA-C 05/03/2013,8:56 AM  Remove chest tube Making good progress

## 2013-05-04 ENCOUNTER — Inpatient Hospital Stay (HOSPITAL_COMMUNITY): Payer: Federal, State, Local not specified - PPO

## 2013-05-04 LAB — CBC
HCT: 27.2 % — ABNORMAL LOW (ref 39.0–52.0)
Hemoglobin: 9.1 g/dL — ABNORMAL LOW (ref 13.0–17.0)
MCHC: 33.5 g/dL (ref 30.0–36.0)
RBC: 3.33 MIL/uL — ABNORMAL LOW (ref 4.22–5.81)
WBC: 10.1 10*3/uL (ref 4.0–10.5)

## 2013-05-04 LAB — BASIC METABOLIC PANEL
BUN: 16 mg/dL (ref 6–23)
CO2: 29 mEq/L (ref 19–32)
Chloride: 102 mEq/L (ref 96–112)
Glucose, Bld: 110 mg/dL — ABNORMAL HIGH (ref 70–99)
Potassium: 3.9 mEq/L (ref 3.5–5.1)

## 2013-05-04 LAB — PROTIME-INR: INR: 1.27 (ref 0.00–1.49)

## 2013-05-04 MED ORDER — LOSARTAN POTASSIUM 50 MG PO TABS
50.0000 mg | ORAL_TABLET | Freq: Every day | ORAL | Status: DC
  Administered 2013-05-04: 50 mg via ORAL
  Filled 2013-05-04: qty 1

## 2013-05-04 MED ORDER — AMIODARONE HCL 200 MG PO TABS: 200.0000 mg | ORAL_TABLET | Freq: Two times a day (BID) | ORAL | Status: DC

## 2013-05-04 MED ORDER — LOSARTAN POTASSIUM 50 MG PO TABS
75.0000 mg | ORAL_TABLET | Freq: Every day | ORAL | Status: DC
  Filled 2013-05-04: qty 1

## 2013-05-04 MED ORDER — OXYCODONE HCL 5 MG PO TABS: 5.0000 mg | ORAL_TABLET | ORAL | Status: DC | PRN

## 2013-05-04 MED ORDER — WARFARIN SODIUM 5 MG PO TABS: 5.0000 mg | ORAL_TABLET | Freq: Every day | ORAL | Status: DC

## 2013-05-04 NOTE — Progress Notes (Addendum)
[address]       [place] [address]             [phone]      5 Days Post-Op  Procedure(s) (LRB): MINIMALLY INVASIVE MITRAL VALVE REPAIR (MVR) (Right) MINIMALLY INVASIVE TRICUSPID VALVE REPAIR (Right) INTRAOPERATIVE TRANSESOPHAGEAL ECHOCARDIOGRAM (N/A) Subjective: Feels well  Objective  Telemetry vpaced  Temp:  [98.7 F (37.1 C)-100 F (37.8 C)] 100 F (37.8 C) (08/25 0438) Pulse Rate:  [65-71] 71 (08/25 0438) Resp:  [18-20] 20 (08/25 0438) BP: (106-148)/(61-70) 138/70 mmHg (08/25 0438) SpO2:  [99 %-100 %] 100 % (08/25 0438) Weight:  [181 lb 6.4 oz (82.283 kg)] 181 lb 6.4 oz (82.283 kg) (08/25 0438)   Intake/Output Summary (Last 24 hours) at 05/04/13 0729 Last data filed at 05/03/13 1733  Gross per 24 hour  Intake    240 ml  Output    300 ml  Net    -60 ml       General appearance: alert, cooperative and no distress Heart: regular rate and rhythm and 2/6 systolic murmur Lungs: clear to auscultation bilaterally Abdomen: benign exam Extremities: no edema Wound: incis healing well  Lab Results:  Recent Labs  05/02/13 0440 05/04/13 0500  NA 139 140  K 3.6 3.9  CL 100 102  CO2 30 29  GLUCOSE 103* 110*  BUN 15 16  CREATININE 1.29 1.18  CALCIUM 9.0 9.4   No results found for this basename: AST, ALT, ALKPHOS, BILITOT, PROT, ALBUMIN,  in the last 72 hours No results found for this basename: LIPASE, AMYLASE,  in the last 72 hours  Recent Labs  05/02/13 0440 05/04/13 0500  WBC 14.6* 10.1  HGB 9.9* 9.1*  HCT 29.8* 27.2*  MCV 82.5 81.7  PLT 131* 202   No results found for this basename: CKTOTAL, CKMB, TROPONINI,  in the last 72 hours No components found with this basename: POCBNP,  No results found for this basename: DDIMER,  in the last 72 hours No results found for this basename: HGBA1C,  in the last 72 hours No results found for this basename: CHOL, HDL, LDLCALC, TRIG, CHOLHDL,  in the last 72 hours No results found  for this basename: TSH, T4TOTAL, FREET3, T3FREE, THYROIDAB,  in the last 72 hours No results found for this basename: VITAMINB12, FOLATE, FERRITIN, TIBC, IRON, RETICCTPCT,  in the last 72 hours  Medications: Scheduled . acetaminophen  1,000 mg Oral Q6H  . amiodarone  200 mg Oral BID  . aspirin EC  81 mg Oral Daily  . atorvastatin  40 mg Oral q1800  . bisacodyl  10 mg Oral Daily   Or  . bisacodyl  10 mg Rectal Daily  . carvedilol  12.5 mg Oral Daily  . coumadin book   Does not apply Once  . digoxin  0.125 mg Oral Daily  . docusate sodium  200 mg Oral Daily  . furosemide  40 mg Oral Daily  . losartan  50 mg Oral Daily  . multivitamin with minerals  1 tablet Oral Daily  . niacin  500 mg Oral Q breakfast  . pantoprazole  40 mg Oral Daily  . potassium chloride  20 mEq Oral Daily  . sodium chloride  3 mL Intravenous Q12H  . sodium chloride  3 mL Intravenous Q12H  . warfarin  5 mg Oral q1800  . Warfarin - Physician Dosing Inpatient   Does not apply q1800     Radiology/Studies:  Dg Chest Port 1 View  05/03/2013   *RADIOLOGY REPORT*  Clinical Data: Status post right chest tube removal.  PORTABLE CHEST - 1 VIEW  Comparison: 05/03/2013 at 6:19 a.m.  Findings: Minimal right pneumothorax is noted following chest tube removal. There is some mild hazy right mid lung zone opacity that is likely atelectasis.  Mild stable atelectasis is noted at the lung bases.  The cardiac silhouette is mildly enlarged.  Changes from cardiac surgery and valve replacement are stable.  IMPRESSION: Minimal right pneumothorax is seen following right chest tube removal.  No other change.   Original Report Authenticated By: Amie Portland, M.D.   Dg Chest Port 1 View  05/03/2013   *RADIOLOGY REPORT*  Clinical Data: Chest tubes, post MVR and TVR  PORTABLE CHEST - 1 VIEW  Comparison: Portable exam 0619 hours compared to 05/01/2013  Findings: Left subclavian transvenous pacemaker leads project over right atrium, right ventricle  and coronary sinus. Enlargement of cardiac silhouette post MVR and TVR. Prominent central pulmonary arteries. Right basilar atelectasis. Right thoracostomy tube unchanged. No definite pneumothorax. Remaining lungs clear.  IMPRESSION: Mild right basilar atelectasis. Improved basilar aeration versus previous study.   Original Report Authenticated By: Ulyses Southward, M.D.    INR:1.27 Will add last result for INR, ABG once components are confirmed Will add last 4 CBG results once components are confirmed  Assessment/Plan: S/P Procedure(s) (LRB): MINIMALLY INVASIVE MITRAL VALVE REPAIR (MVR) (Right) MINIMALLY INVASIVE TRICUSPID VALVE REPAIR (Right) INTRAOPERATIVE TRANSESOPHAGEAL ECHOCARDIOGRAM (N/A)  1 Overall doing well, I suspect low grade temp is related to atx, but CXR shows RLL to have a bit more dense infiltrate. Push pulm toilet/rehab as able. No leukocytosis. 2 H/H fairly stable 3 some systolic HTN readings, will go to 75 with cozaar 4 cont coumadin 5 poss home today- will d/w MD    LOS: 5 days    GOLD,WAYNE E 8/25/20147:29 AM  I have seen and examined the patient and agree with the assessment and plan as outlined.  Will d/c home on pre-op dose of Cozaar without increasing it further.  OWEN,CLARENCE H 05/04/2013 8:29 AM

## 2013-05-04 NOTE — Progress Notes (Signed)
CARDIAC REHAB PHASE I   PRE:  Rate/Rhythm: 70 paced    BP: sitting 117/53    SaO2: 100 RA  MODE:  Ambulation: 350 ft   POST:  Rate/Rhythm: 80 paced     BP: sitting 105/52     SaO2: 100 RA  Pt doing well. Walked short distance to make sure of stability without RW. Tolerated very well with quick pace. Pt sts he walked to Main Entrance yesterday. Ed completed. Interested in CRPII and will send referral to [place].  To watch video with wife. [phone]  Elissa Lovett [place] CES, ACSM 05/04/2013 8:44 AM

## 2013-05-05 MED FILL — Sodium Chloride IV Soln 0.9%: INTRAVENOUS | Qty: 1000 | Status: AC

## 2013-05-05 MED FILL — Heparin Sodium (Porcine) Inj 1000 Unit/ML: INTRAMUSCULAR | Qty: 30 | Status: AC

## 2013-05-05 MED FILL — Potassium Chloride Inj 2 mEq/ML: INTRAVENOUS | Qty: 40 | Status: AC

## 2013-05-05 MED FILL — Magnesium Sulfate Inj 50%: INTRAMUSCULAR | Qty: 10 | Status: AC

## 2013-05-06 ENCOUNTER — Ambulatory Visit (INDEPENDENT_AMBULATORY_CARE_PROVIDER_SITE_OTHER): Payer: Federal, State, Local not specified - PPO | Admitting: *Deleted

## 2013-05-06 ENCOUNTER — Encounter (HOSPITAL_COMMUNITY): Payer: Self-pay | Admitting: Dentistry

## 2013-05-06 DIAGNOSIS — I079 Rheumatic tricuspid valve disease, unspecified: Secondary | ICD-10-CM

## 2013-05-06 DIAGNOSIS — I071 Rheumatic tricuspid insufficiency: Secondary | ICD-10-CM

## 2013-05-06 DIAGNOSIS — Z9889 Other specified postprocedural states: Secondary | ICD-10-CM

## 2013-05-06 DIAGNOSIS — I340 Nonrheumatic mitral (valve) insufficiency: Secondary | ICD-10-CM

## 2013-05-06 DIAGNOSIS — I059 Rheumatic mitral valve disease, unspecified: Secondary | ICD-10-CM

## 2013-05-06 LAB — POCT INR: INR: 1.5

## 2013-05-13 ENCOUNTER — Ambulatory Visit (INDEPENDENT_AMBULATORY_CARE_PROVIDER_SITE_OTHER): Payer: Federal, State, Local not specified - PPO | Admitting: *Deleted

## 2013-05-13 DIAGNOSIS — Z9889 Other specified postprocedural states: Secondary | ICD-10-CM

## 2013-05-13 DIAGNOSIS — I340 Nonrheumatic mitral (valve) insufficiency: Secondary | ICD-10-CM

## 2013-05-13 DIAGNOSIS — I071 Rheumatic tricuspid insufficiency: Secondary | ICD-10-CM

## 2013-05-13 DIAGNOSIS — I059 Rheumatic mitral valve disease, unspecified: Secondary | ICD-10-CM

## 2013-05-13 DIAGNOSIS — I079 Rheumatic tricuspid valve disease, unspecified: Secondary | ICD-10-CM

## 2013-05-14 ENCOUNTER — Other Ambulatory Visit: Payer: Self-pay | Admitting: *Deleted

## 2013-05-14 DIAGNOSIS — I059 Rheumatic mitral valve disease, unspecified: Secondary | ICD-10-CM

## 2013-05-18 ENCOUNTER — Ambulatory Visit
Admission: RE | Admit: 2013-05-18 | Discharge: 2013-05-18 | Disposition: A | Discharge status: Home / Self Care | Payer: Federal, State, Local not specified - PPO | Source: Ambulatory Visit | Attending: Thoracic Surgery (Cardiothoracic Vascular Surgery) | Admitting: Thoracic Surgery (Cardiothoracic Vascular Surgery)

## 2013-05-18 ENCOUNTER — Encounter: Payer: Self-pay | Admitting: Thoracic Surgery (Cardiothoracic Vascular Surgery)

## 2013-05-18 ENCOUNTER — Ambulatory Visit (INDEPENDENT_AMBULATORY_CARE_PROVIDER_SITE_OTHER): Payer: Federal, State, Local not specified - PPO | Admitting: Thoracic Surgery (Cardiothoracic Vascular Surgery)

## 2013-05-18 VITALS — BP 124/74 | HR 60 | Resp 20 | Ht 70.5 in | Wt 180.0 lb

## 2013-05-18 DIAGNOSIS — I079 Rheumatic tricuspid valve disease, unspecified: Secondary | ICD-10-CM

## 2013-05-18 DIAGNOSIS — I340 Nonrheumatic mitral (valve) insufficiency: Secondary | ICD-10-CM

## 2013-05-18 DIAGNOSIS — I071 Rheumatic tricuspid insufficiency: Secondary | ICD-10-CM

## 2013-05-18 DIAGNOSIS — I059 Rheumatic mitral valve disease, unspecified: Secondary | ICD-10-CM

## 2013-05-18 DIAGNOSIS — Z9889 Other specified postprocedural states: Secondary | ICD-10-CM

## 2013-05-18 MED ORDER — AMIODARONE HCL 200 MG PO TABS: 200.0000 mg | ORAL_TABLET | Freq: Every day | ORAL | Status: DC

## 2013-05-18 NOTE — Patient Instructions (Signed)
The patient may continue to gradually increase their physical activity as tolerated.  They should refrain from any heavy lifting or strenuous use of their arms and shoulders until at least 8 weeks from the time of their surgery, and they should avoid activities that cause increased pain in their chest on the side of their surgical incision.  Otherwise they may continue to increase their activities without any particular limitations.  The patient is to decrease their dose of Amiodarone to 200 mg daily until their current prescription runs out, then stop taking it altogether.

## 2013-05-18 NOTE — Progress Notes (Signed)
[address]       [place] [address]             [phone]     CARDIOTHORACIC SURGERY OFFICE NOTE  Referring Provider is Bensimhon, Bevelyn Buckles, MD PCP is [name], [name], MD   HPI:  Patient returns for routine follow up s/p minimally invasive mitral and tricuspid valve repair on 04/29/2013 for dilated non-ischemic cardiomyopathy with severe mitral and tricuspid regurgitation.  His postoperative recovery has been uncomplicated.  Since hospital discharge she has continued to do exceptionally well. He never filled his prescription for pain relievers and he reports only very mild soreness in his chest and right groin. He is not taking any type of pain relievers. He denies any shortness of breath. His appetite is good but complains that he still having problems because he cannot chew meat since he underwent dental extraction. Overall he looks terrific. His prothrombin time has been checked in the Coumadin clinic, and most recently his INR was 2.3.   Current Outpatient Prescriptions  Medication Sig Dispense Refill  . amiodarone (PACERONE) 200 MG tablet Take 1 tablet (200 mg total) by mouth 2 (two) times daily.  60 tablet  1  . aspirin EC 81 MG tablet Take 81 mg by mouth daily.      . carvedilol (COREG) 12.5 MG tablet Take 12.5 mg by mouth daily.      . digoxin (LANOXIN) 0.125 MG tablet Take 0.125 mg by mouth daily.      [name] losartan (COZAAR) 25 MG tablet Take 25 mg by mouth daily.      . Multiple Vitamin (MULTIVITAMIN WITH MINERALS) TABS Take 1 tablet by mouth daily.      . niacin 500 MG tablet Take 500 mg by mouth daily with breakfast.      . simvastatin (ZOCOR) 80 MG tablet Take 80 mg by mouth at bedtime.      [name] warfarin (COUMADIN) 5 MG tablet Take 1 tablet (5 mg total) by mouth daily at 6 PM.  100 tablet  1   No current facility-administered medications for this visit.      Physical Exam:   BP 124/74  Pulse 60  Resp 20  Ht 5' 10.5" (1.791 m)  Wt 180 lb (81.647  kg)  BMI 25.45 kg/m2  SpO2 98%  General:  Well-appearing  Chest:   Clear to auscultation  CV:   Regular rate and rhythm with no murmur  Incisions:  Clean and dry and healing nicely  Abdomen:  Soft nontender  Extremities:  Warm and well-perfused  Diagnostic Tests:  *RADIOLOGY REPORT*   Clinical Data: History of mitral regurgitation and prior valve repair   CHEST - 2 VIEW   Comparison: 05/04/2013   Findings: The cardiac shadow is within normal limits.  Postsurgical changes are again seen.  A pacing device is again noted.  The lungs are clear bilaterally.  No acute bony abnormality is seen.   IMPRESSION: No acute abnormality noted.     Original Report Authenticated By: Alcide Clever, M.D.    Impression:  Russell Martin is doing exceptionally well less than 3 weeks following minimally invasive mitral and tricuspid valve repair.  Plan:  I've encouraged Russell Martin to continue to increase his physical activity gradually with his only limitation that he refrain from heavy lifting or strenuous use of his arms or shoulders for another 6 weeks. He may return to work as soon as he feels comfortable with it.  I've instructed him to decrease his dose of amiodarone to 200 mg daily and I would favor stopping amiodarone completely within the next few weeks. Now that he is therapeutic on Coumadin I've instructed him to stop taking aspirin. We have otherwise made no changes in his current medications. We will have him return in 2 months time. He continues to do well he can probably stop Coumadin at that point.   Salvatore Decent. Cornelius Moras, MD 05/18/2013 10:22 AM

## 2013-05-20 ENCOUNTER — Encounter (HOSPITAL_COMMUNITY): Payer: Self-pay | Admitting: Dentistry

## 2013-05-20 ENCOUNTER — Ambulatory Visit (INDEPENDENT_AMBULATORY_CARE_PROVIDER_SITE_OTHER): Payer: Federal, State, Local not specified - PPO | Admitting: *Deleted

## 2013-05-20 ENCOUNTER — Ambulatory Visit (HOSPITAL_COMMUNITY): Payer: Self-pay | Admitting: Dentistry

## 2013-05-20 VITALS — BP 149/74 | HR 62 | Temp 98.0°F

## 2013-05-20 DIAGNOSIS — I079 Rheumatic tricuspid valve disease, unspecified: Secondary | ICD-10-CM

## 2013-05-20 DIAGNOSIS — K08109 Complete loss of teeth, unspecified cause, unspecified class: Secondary | ICD-10-CM

## 2013-05-20 DIAGNOSIS — I059 Rheumatic mitral valve disease, unspecified: Secondary | ICD-10-CM

## 2013-05-20 DIAGNOSIS — Z9889 Other specified postprocedural states: Secondary | ICD-10-CM

## 2013-05-20 DIAGNOSIS — I071 Rheumatic tricuspid insufficiency: Secondary | ICD-10-CM

## 2013-05-20 DIAGNOSIS — Z463 Encounter for fitting and adjustment of dental prosthetic device: Secondary | ICD-10-CM

## 2013-05-20 DIAGNOSIS — I340 Nonrheumatic mitral (valve) insufficiency: Secondary | ICD-10-CM

## 2013-05-20 LAB — POCT INR: INR: 3

## 2013-05-20 NOTE — Progress Notes (Signed)
05/20/2013  Patient:            Russell Martin Date of Birth:  09-18-1950 MRN:                [id number]  BP 149/74  Pulse 62  Temp(Src) 98 F (36.7 C) (Oral)   [name] presents for start of upper and lower denture fabrication. Exam: Patient is edentulous. Discussed procedures involved in upper and lower denture fabrication and prognosis for successful ability to wear dentures. Price for dentures confirmed.  Patient agrees to proceed with upper and lower denture fabrication. Procedure:  Upper and lower denture primary impressions in alginate. Lab pour. To [facility] for upper and lower denture custom tray fabrication. RTC for upper and lower denture final impressions.  [name], DDS

## 2013-05-20 NOTE — Patient Instructions (Signed)
Return to clinic as scheduled for continued upper and lower complete denture fabrication. Russell Martin, DDS 

## 2013-05-27 ENCOUNTER — Ambulatory Visit (HOSPITAL_COMMUNITY): Payer: Self-pay | Admitting: Dentistry

## 2013-05-27 ENCOUNTER — Encounter (HOSPITAL_COMMUNITY): Payer: Self-pay | Admitting: Dentistry

## 2013-05-27 ENCOUNTER — Ambulatory Visit (INDEPENDENT_AMBULATORY_CARE_PROVIDER_SITE_OTHER): Payer: Federal, State, Local not specified - PPO | Admitting: Pharmacist

## 2013-05-27 VITALS — BP 141/73 | HR 60 | Temp 99.0°F

## 2013-05-27 DIAGNOSIS — Z9889 Other specified postprocedural states: Secondary | ICD-10-CM

## 2013-05-27 DIAGNOSIS — K08109 Complete loss of teeth, unspecified cause, unspecified class: Secondary | ICD-10-CM

## 2013-05-27 DIAGNOSIS — I079 Rheumatic tricuspid valve disease, unspecified: Secondary | ICD-10-CM

## 2013-05-27 DIAGNOSIS — I059 Rheumatic mitral valve disease, unspecified: Secondary | ICD-10-CM

## 2013-05-27 DIAGNOSIS — I071 Rheumatic tricuspid insufficiency: Secondary | ICD-10-CM

## 2013-05-27 DIAGNOSIS — Z463 Encounter for fitting and adjustment of dental prosthetic device: Secondary | ICD-10-CM

## 2013-05-27 DIAGNOSIS — I340 Nonrheumatic mitral (valve) insufficiency: Secondary | ICD-10-CM

## 2013-05-27 LAB — POCT INR: INR: 3.8

## 2013-05-27 NOTE — Progress Notes (Signed)
05/27/2013   Patient:            Russell Martin Date of Birth:  1950/10/17 MRN:                [id number]    BP 141/73  Pulse 60  Temp(Src) 99 F (37.2 C) (Oral)  [name] presents for continued upper and lower complete denture fabrication. Procedure:  Upper and lower border molding and final impressions in Aquasil. Patient tolerated procedure well. To [facility] for custom baseplates with rims. Return to clinic for upper and lower complete denture jaw relations.   [name], DDS

## 2013-05-27 NOTE — Patient Instructions (Signed)
Return to clinic as scheduled for continued upper and lower complete denture fabrication. Ronald F. Kulinski, DDS 

## 2013-05-28 ENCOUNTER — Encounter (HOSPITAL_COMMUNITY): Payer: Self-pay | Admitting: Dentistry

## 2013-06-01 ENCOUNTER — Ambulatory Visit (HOSPITAL_COMMUNITY)
Admission: RE | Admit: 2013-06-01 | Discharge: 2013-06-01 | Disposition: A | Discharge status: Home / Self Care | Payer: Federal, State, Local not specified - PPO | Source: Ambulatory Visit | Attending: Internal Medicine | Admitting: Internal Medicine

## 2013-06-01 ENCOUNTER — Encounter (HOSPITAL_COMMUNITY): Payer: Self-pay

## 2013-06-01 ENCOUNTER — Encounter (HOSPITAL_COMMUNITY): Payer: Self-pay | Admitting: *Deleted

## 2013-06-01 VITALS — BP 130/72 | HR 62 | Wt 178.8 lb

## 2013-06-01 DIAGNOSIS — Z9889 Other specified postprocedural states: Secondary | ICD-10-CM

## 2013-06-01 DIAGNOSIS — I5022 Chronic systolic (congestive) heart failure: Secondary | ICD-10-CM | POA: Insufficient documentation

## 2013-06-01 LAB — BASIC METABOLIC PANEL
CO2: 27 mEq/L (ref 19–32)
GFR calc non Af Amer: 55 mL/min — ABNORMAL LOW (ref >90)
Glucose, Bld: 100 mg/dL — ABNORMAL HIGH (ref 70–99)
Potassium: 4.2 mEq/L (ref 3.5–5.1)
Sodium: 137 mEq/L (ref 135–145)

## 2013-06-01 LAB — DIGOXIN LEVEL: Digoxin Level: 0.7 ng/mL — ABNORMAL LOW (ref 0.8–2.0)

## 2013-06-01 MED ORDER — CARVEDILOL 12.5 MG PO TABS: 12.5000 mg | ORAL_TABLET | Freq: Two times a day (BID) | ORAL | Status: DC

## 2013-06-01 NOTE — Progress Notes (Addendum)
EP: Dr Ladona Ridgel  PCP: Dr Jillyn Hidden  Cardiologist: Dr Cornelius Moras  HPI:   Russell Martin is a 62 year old with PMH of systolic HF due to NICM (EF 35%) s/p BiVICD, complete heart block 2001 S/P permanent pacemaker, gout, HTN, Russell, TR, and hyperlipidemia. He was previously followed by Dr. Alanda Amass.  09/2012 LHC/RHC No CAD.  RHC RA: 11  RV: 58-60/10  PA: 60/28; mean 44  PCWP: 17; v wave 25  LV: 116/12  CO: 5.4 (Thermo); 5.3 l/m  CI: 2.6 2.5 l/m/m2   01/27/13 ECHO EF 45-45% Severe Russell Mod-Severe TR  04/09/13 TEE EF 40% Mod TR Severe central Russell  8/14 echo EF 50-55%, s/p TV/MV repair with no TR/Russell, aortic valve opens well but LVOT gradient to 25 mmHg peak.   Follow up: Since last visit was admitted 8/20-8/22/14 for MVR repair and TV ring annuloplasty. Feeling great. Walking everyday 15-30 min a day. Denies SOB, orthopnea, edema, or CP. Mild incisional pain. Weight at home 177-180 pounds. Compliant with medications. 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   SH: Lives with wife in [place]  ROS: All systems negative except as listed in HPI, PMH and Problem List.  Past Medical History  Diagnosis Date  . CHF (congestive heart failure)     a. NICM EF 35% - no CAD by cath 09/2012. b. s/p upgrade to Bi-V pacemaker 11/2012 (did not get ICD as Dr. Ladona Ridgel expects his EF will improve with bi-V pacing).  . Hyperlipidemia   . Hypertension     pt denies HTN  . CHB (complete heart block)     a. Pacer initially placed ~2002.   . Mitral valve regurgitation   . Pacemaker     st jude    greg taylor  . S/P mitral valve repair 04/29/2013    28mm Sorin Memo 3D ring annuloplasty via right mini thoracotomy  . S/P tricuspid valve repair 04/29/2013    28 mm Edwards mc3 ring annuloplasty via right mini thoracotomy    Current Outpatient Prescriptions  Medication Sig Dispense Refill  . amiodarone (PACERONE) 200 MG tablet Take 1 tablet (200 mg total) by mouth daily.  60 tablet  1  . carvedilol  (COREG) 12.5 MG tablet Take 12.5 mg by mouth daily.      . digoxin (LANOXIN) 0.125 MG tablet Take 0.125 mg by mouth daily.      [name] losartan (COZAAR) 25 MG tablet Take 25 mg by mouth daily.      . Multiple Vitamin (MULTIVITAMIN WITH MINERALS) TABS Take 1 tablet by mouth daily.      . niacin 500 MG tablet Take 500 mg by mouth daily with breakfast.      . simvastatin (ZOCOR) 80 MG tablet Take 80 mg by mouth at bedtime.      [name] warfarin (COUMADIN) 5 MG tablet Take 1 tablet (5 mg total) by mouth daily at 6 PM.  100 tablet  1   No current facility-administered medications for this encounter.   PHYSICAL EXAM: Filed Vitals:   06/01/13 0905  BP: 130/72  Pulse: 62  Weight: 178 lb 12.8 oz (81.103 kg)  SpO2: 100%   General:  Well appearing. No resp difficulty; wife present HEENT: normal Neck: supple. JVP flat. Carotids 2+ bilaterally; no bruits. No lymphadenopathy or thryomegaly appreciated. Cor: PMI normal. Regular rate & rhythm. No rubs, gallops.  2/6 SEM RUSB. Lungs: clear Abdomen: soft, nontender, nondistended. No hepatosplenomegaly. No bruits  or masses. Good bowel sounds. Extremities: no cyanosis, clubbing, rash, edema Neuro: alert & orientedx3, cranial nerves grossly intact. Moves all 4 extremities w/o difficulty. Affect pleasant.  ASSESSMENT & PLAN:  1) Chronic systolic HF, NICM, EF 50-55% s/p MV and TV repair and CRT (improved). - NYHA I symptoms. Volume status good. - Stop Amiodarone (was on amiodarone for afib prophylaxis with surgery). - Increase Coreg to 12.5 mg BID. Continue losartan 25 mg daily and digoxin 0.125 daily - Reinforced the need and importance of daily weights, a low sodium diet, and fluid restriction (less than 2 L a day). Instructed to call the HF clinic if weight increases more than 3 lbs overnight or 5 lbs in a week.  - Check BMET and digoxin level today - Follow up 2 months  2) MV repair - post ECHO stable without Russell. - Plan to stop coumadin after next appt with  Dr. Cornelius Moras.   3) TV repair - post ECHO stable without TR.   4) HTN - controlled on losartan and carvedilol. As above increase carvedilol to 12.5 mg BID.   Ulla Potash B NP-C 9:29 AM  Patient seen with NP, agree with the above note.  Doing great, euvolemic on exam. Post-op echo with EF 50-55% with intact MV and TV repairs.   Can discontinue amiodarone today (was for atrial fib prophylaxis with surgery).  Can discontinue coumadin when he next sees Dr. Cornelius Moras.    Take Coreg bid.   BMET/digoxin level today.   Followup in 2 months.   Marca Ancona 06/01/2013 9:33 AM

## 2013-06-01 NOTE — Patient Instructions (Addendum)
Stop Amiodarone.  Increase coreg 12.5 mg twice a day.  Will call about lab results.  F/U 2 months  Do the following things EVERYDAY: 1) Weigh yourself in the morning before breakfast. Write it down and keep it in a log. 2) Take your medicines as prescribed 3) Eat low salt foods-Limit salt (sodium) to 2000 mg per day.  4) Stay as active as you can everyday 5) Limit all fluids for the day to less than 2 liters 6)

## 2013-06-03 ENCOUNTER — Ambulatory Visit (INDEPENDENT_AMBULATORY_CARE_PROVIDER_SITE_OTHER): Payer: Federal, State, Local not specified - PPO | Admitting: *Deleted

## 2013-06-03 DIAGNOSIS — I079 Rheumatic tricuspid valve disease, unspecified: Secondary | ICD-10-CM

## 2013-06-03 DIAGNOSIS — Z9889 Other specified postprocedural states: Secondary | ICD-10-CM

## 2013-06-03 DIAGNOSIS — I059 Rheumatic mitral valve disease, unspecified: Secondary | ICD-10-CM

## 2013-06-03 DIAGNOSIS — I071 Rheumatic tricuspid insufficiency: Secondary | ICD-10-CM

## 2013-06-03 DIAGNOSIS — I340 Nonrheumatic mitral (valve) insufficiency: Secondary | ICD-10-CM

## 2013-06-03 LAB — POCT INR: INR: 3.6

## 2013-06-04 ENCOUNTER — Encounter (HOSPITAL_COMMUNITY): Payer: Self-pay | Admitting: Dentistry

## 2013-06-08 ENCOUNTER — Encounter (HOSPITAL_COMMUNITY): Payer: Self-pay | Admitting: Dentistry

## 2013-06-08 ENCOUNTER — Ambulatory Visit (HOSPITAL_COMMUNITY): Payer: Self-pay | Admitting: Dentistry

## 2013-06-08 VITALS — BP 119/68 | HR 62 | Temp 99.2°F

## 2013-06-08 DIAGNOSIS — K08109 Complete loss of teeth, unspecified cause, unspecified class: Secondary | ICD-10-CM

## 2013-06-08 DIAGNOSIS — Z463 Encounter for fitting and adjustment of dental prosthetic device: Secondary | ICD-10-CM

## 2013-06-08 NOTE — Patient Instructions (Addendum)
Return to clinic as scheduled for upper lower complete denture wax try in. Dr. Kristin Bruins

## 2013-06-08 NOTE — Progress Notes (Signed)
06/08/2013  Patient:            [place]ell Martin Date of Birth:  January 04, 1951 MRN:                [id number]  BP 119/68  Pulse 62  Temp(Src) 99.2 F (37.3 C) (Oral)   [name] presents for continued denture fabrication. Procedure:  Upper and lower denture Jaw relations with aluwax bite registration. Patient agrees to tooth selection of 22H, P, and 10 degree posteriors to match with Portrait A2 shade. Patient tolerated procedure well. RTC for denture wax try in.  [name], DDS

## 2013-06-11 ENCOUNTER — Encounter (HOSPITAL_COMMUNITY): Payer: Self-pay | Admitting: Dentistry

## 2013-06-11 ENCOUNTER — Ambulatory Visit (HOSPITAL_COMMUNITY): Payer: Self-pay | Admitting: Dentistry

## 2013-06-11 VITALS — BP 133/73 | HR 60 | Temp 98.4°F

## 2013-06-11 DIAGNOSIS — Z463 Encounter for fitting and adjustment of dental prosthetic device: Secondary | ICD-10-CM

## 2013-06-11 DIAGNOSIS — K08109 Complete loss of teeth, unspecified cause, unspecified class: Secondary | ICD-10-CM

## 2013-06-11 NOTE — Progress Notes (Signed)
06/11/2013  Patient:            KHRIZ LIDDY Date of Birth:  Dec 11, 1950 MRN:                [id number]  BP 133/73  Pulse 60  Temp(Src) 98.4 F (36.9 C) (Oral)  [name] presents for continued upper and lower denture fabrication. Procedure:  Upper and lower complete denture wax tryin. Patient accepts esthetics, phonetics, fit and function. Patient agrees to process "as is" in 50:50 [place] 199. Patient to RTC for  upper and lower denture insertion.   [name], DDS

## 2013-06-11 NOTE — Patient Instructions (Signed)
Return clinic as scheduled for upper and lower complete denture insertion

## 2013-06-12 ENCOUNTER — Ambulatory Visit (INDEPENDENT_AMBULATORY_CARE_PROVIDER_SITE_OTHER): Payer: Federal, State, Local not specified - PPO | Admitting: *Deleted

## 2013-06-12 DIAGNOSIS — I079 Rheumatic tricuspid valve disease, unspecified: Secondary | ICD-10-CM

## 2013-06-12 DIAGNOSIS — I071 Rheumatic tricuspid insufficiency: Secondary | ICD-10-CM

## 2013-06-12 DIAGNOSIS — Z9889 Other specified postprocedural states: Secondary | ICD-10-CM

## 2013-06-12 DIAGNOSIS — I340 Nonrheumatic mitral (valve) insufficiency: Secondary | ICD-10-CM

## 2013-06-12 DIAGNOSIS — I059 Rheumatic mitral valve disease, unspecified: Secondary | ICD-10-CM

## 2013-06-12 LAB — POCT INR: INR: 2.1

## 2013-06-18 ENCOUNTER — Ambulatory Visit (HOSPITAL_COMMUNITY): Payer: Self-pay | Admitting: Dentistry

## 2013-06-18 ENCOUNTER — Encounter (HOSPITAL_COMMUNITY): Payer: Self-pay | Admitting: Dentistry

## 2013-06-18 ENCOUNTER — Encounter (INDEPENDENT_AMBULATORY_CARE_PROVIDER_SITE_OTHER): Payer: Self-pay

## 2013-06-18 VITALS — BP 150/68 | HR 60 | Temp 98.3°F

## 2013-06-18 DIAGNOSIS — Z463 Encounter for fitting and adjustment of dental prosthetic device: Secondary | ICD-10-CM

## 2013-06-18 DIAGNOSIS — K08109 Complete loss of teeth, unspecified cause, unspecified class: Secondary | ICD-10-CM

## 2013-06-18 NOTE — Patient Instructions (Signed)
Instructions for Denture Use and Care  Congratulations, you are on the way to oral rehabilitation!  You have just received a new set of complete or partial dentures.  These prostheses will help to improve both your appearance and chewing ability.  These instructions will help you get adjusted to your dentures as well as care for them properly.  Please read these instructions carefully and completely as soon as you get home.  If you or your caregiver have any questions please notify the  Dental Clinic at [phone].  HOW YOUR DENTURES LOOK AND FEEL Soon after you begin wearing your dentures, you may feel that your dentures are too large or even loose.  As our mouth and facial muscles become accustomed to the dentures, these feelings will go away.  You also may feel that you are salivating more than you normally do.  This feeling should go away as you get used to having the dentures in your mouth.  You may bite your cheek or your tongue; this will eventually resolve itself as you wear your dentures.  Some soreness is to be expected, but you should not hurt.  If your mouth hurts, call your dentist.  A denture adhesive may occasionally be necessary to hold your dentures in place more securely.  The dentist will let you know when one is recommended for you.  SPEAKING Wearing dentures will change the sound of your voice initially.  This will be noticed by you more than anyone else.  Bite and swallow before you speak, in order to place your dentures in position so that you may speak more clearly.  Practice speaking by reading aloud or counting from 1 to 100 very slowly and distinctly.  After some practice your mouth will become accustomed to your dentures and you will speak more clearly.  EATING Chewing will definitely be different after you receive your dentures.  With a little practice and patience you should be able to eat just about any kind of food.  Begin by eating small quantities of food  that are cut into small pieces.  Star with soft foods such as eggs, cooked vegetables, or puddings.  As you gain confidence advance  Your diet to whatever texture foods you can tolerate.  DENTURE CARE Dentures can collect plaque and calculus much the same as natural teeth can.  If not removed on a regular basis, your dentures will not look or feel clean, and you will experience denture odor.  It is very important that you remove your dentures at bedtime and clean them thoroughly.  You should: 1. Clean your dentures over a sink full of water so if dropped, breakage will be prevented. 2. Rinse your dentures with cool water to remove any large food particles. 3. Use soap and water or a denture cleanser or paste to clean the dentures.  Do not use regular toothpaste as it may abrade the denture base or teeth. 4. Use a moistened denture brush to clean all surfaces (inside and outside). 5. Rinse thoroughly to remove any remaining soap or denture cleanser. 6. Use a soft bristle toothbrush to gently brush any natural teeth, gums, tongue, and palate at bedtime and before reinserting your dentures. 7. Do not sleep with your dentures in your mouth at night.  Remove your dentures and soak them overnight in a denture cup filled with water or denture solution as recommended by your dentist.  This routine will become second nature and will increase the life and comfort   of your dentures.  Please do not try to adjust these dentures yourself; you could damage them.  FOLLOW-UP You should call or make an appointment with your dentist.  Your dentist would like to see you at least once a year for a check-up and examination. 

## 2013-06-18 NOTE — Progress Notes (Signed)
06/18/2013  Patient:            Russell Martin Date of Birth:  07/16/1951 MRN:                [id number]  BP 150/68  Pulse 60  Temp(Src) 98.3 F (36.8 C) (Oral)  [name] presents for insertion of upper and lower complete dentures. Procedure: Pressure indicating paste applied to dentures. Adjustments made as needed. [place]. Occlusion evaluated and adjustments made as needed for Centric Relation and protrusive strokes. Good esthetics, phonetics, fit, and function noted. Patient accepts results. Post op instructions provided in written and verbal formats on use and care of dentures. Gave patient denture brush and cup. Patient to keep dentures out if sore spots develop. Use salt water rinses as needed to aid healing. Return to clinic as scheduled for denture adjustment.  Call if problems arise before then. Patient dismissed in stable condition.  [name], DDS

## 2013-06-22 ENCOUNTER — Ambulatory Visit (INDEPENDENT_AMBULATORY_CARE_PROVIDER_SITE_OTHER): Payer: Federal, State, Local not specified - PPO | Admitting: Pharmacist

## 2013-06-22 ENCOUNTER — Ambulatory Visit (HOSPITAL_COMMUNITY): Payer: Self-pay | Admitting: Dentistry

## 2013-06-22 ENCOUNTER — Encounter (HOSPITAL_COMMUNITY): Payer: Self-pay | Admitting: Dentistry

## 2013-06-22 ENCOUNTER — Encounter (INDEPENDENT_AMBULATORY_CARE_PROVIDER_SITE_OTHER): Payer: Self-pay

## 2013-06-22 VITALS — BP 151/90 | HR 61 | Temp 98.0°F

## 2013-06-22 DIAGNOSIS — Z463 Encounter for fitting and adjustment of dental prosthetic device: Secondary | ICD-10-CM

## 2013-06-22 DIAGNOSIS — I340 Nonrheumatic mitral (valve) insufficiency: Secondary | ICD-10-CM

## 2013-06-22 DIAGNOSIS — I059 Rheumatic mitral valve disease, unspecified: Secondary | ICD-10-CM

## 2013-06-22 DIAGNOSIS — K062 Gingival and edentulous alveolar ridge lesions associated with trauma: Secondary | ICD-10-CM

## 2013-06-22 DIAGNOSIS — I079 Rheumatic tricuspid valve disease, unspecified: Secondary | ICD-10-CM

## 2013-06-22 DIAGNOSIS — Z9889 Other specified postprocedural states: Secondary | ICD-10-CM

## 2013-06-22 DIAGNOSIS — K08109 Complete loss of teeth, unspecified cause, unspecified class: Secondary | ICD-10-CM

## 2013-06-22 DIAGNOSIS — I071 Rheumatic tricuspid insufficiency: Secondary | ICD-10-CM

## 2013-06-22 NOTE — Patient Instructions (Signed)
Patient to keep dentures out if sore spots develop. Use salt water rinses as needed to aid healing. Return to clinic as scheduled for denture adjustment.   Call if problems arise before then. Dr. Kulinski  

## 2013-06-22 NOTE — Progress Notes (Signed)
06/22/2013  Patient:            Russell Martin Date of Birth:  1950/10/07 MRN:                [id number]  BP 151/90  Pulse 61  Temp(Src) 98 F (36.7 C) (Oral)  [name] presents for evaluation of recently inserted upper and lower complete dentures. SUBJECTIVE: Patient is complaining of slight irritation to the maxillary and mandibular labial frenums. OBJECTIVE: The patient is edentulous. There is no sign of ulceration. Slight erythema to the maxillary and mandibular labial frenums. Procedure: Pressure indicating paste applied to dentures. Adjustments made as needed. [place]. Occlusion evaluated and adjustments made as needed for Centric Relation and protrusive strokes. Patient accepts results. Patient to keep dentures out if sore spots develop. Use salt water rinses as needed to aid healing. Return to clinic as scheduled for denture adjustment.  Call if problems arise before then. Patient dismissed in stable condition.  [name], DDS

## 2013-07-06 ENCOUNTER — Encounter (INDEPENDENT_AMBULATORY_CARE_PROVIDER_SITE_OTHER): Payer: Self-pay

## 2013-07-06 ENCOUNTER — Encounter (HOSPITAL_COMMUNITY): Payer: Self-pay | Admitting: Dentistry

## 2013-07-06 ENCOUNTER — Ambulatory Visit (INDEPENDENT_AMBULATORY_CARE_PROVIDER_SITE_OTHER): Payer: Federal, State, Local not specified - PPO | Admitting: Pharmacist

## 2013-07-06 ENCOUNTER — Ambulatory Visit (HOSPITAL_COMMUNITY): Payer: Self-pay | Admitting: Dentistry

## 2013-07-06 VITALS — BP 159/87 | HR 60 | Temp 98.4°F

## 2013-07-06 DIAGNOSIS — K062 Gingival and edentulous alveolar ridge lesions associated with trauma: Secondary | ICD-10-CM

## 2013-07-06 DIAGNOSIS — K08109 Complete loss of teeth, unspecified cause, unspecified class: Secondary | ICD-10-CM

## 2013-07-06 DIAGNOSIS — Z9889 Other specified postprocedural states: Secondary | ICD-10-CM

## 2013-07-06 DIAGNOSIS — Z463 Encounter for fitting and adjustment of dental prosthetic device: Secondary | ICD-10-CM

## 2013-07-06 DIAGNOSIS — Z972 Presence of dental prosthetic device (complete) (partial): Secondary | ICD-10-CM

## 2013-07-06 DIAGNOSIS — I071 Rheumatic tricuspid insufficiency: Secondary | ICD-10-CM

## 2013-07-06 DIAGNOSIS — I340 Nonrheumatic mitral (valve) insufficiency: Secondary | ICD-10-CM

## 2013-07-06 DIAGNOSIS — I079 Rheumatic tricuspid valve disease, unspecified: Secondary | ICD-10-CM

## 2013-07-06 DIAGNOSIS — I059 Rheumatic mitral valve disease, unspecified: Secondary | ICD-10-CM

## 2013-07-06 LAB — POCT INR: INR: 1.9

## 2013-07-06 NOTE — Patient Instructions (Addendum)
Patient to keep dentures out if sore spots develop. Use salt water rinses as needed to aid healing. Return to clinic as scheduled for denture adjustment.   Call if problems arise before then. Dr. Anieya Helman  

## 2013-07-06 NOTE — Progress Notes (Signed)
07/06/2013  Patient:            MURLE OTTING Date of Birth:  [birth year] MRN:                [id number]  BP 159/87  Pulse 60  Temp(Src) 98.4 F (36.9 C) (Oral)  [name] presents for evaluation of recently inserted upper and lower complete dentures. SUBJECTIVE: Patient is complaining of slight irritation to several areas involving the lower denture. OBJECTIVE: The patient is edentulous. There is no sign of ulceration. Slight erythema to the labial frenum areas of mandibular anterior facial vestibule. Procedure: Pressure indicating paste applied to dentures. Adjustments made as needed.  Thick PIP to mandibular facial frenum areas. Adjusted as needed [place]. Occlusion evaluated and adjustments made as needed for Centric Relation and protrusive strokes. Patient accepts results. Patient to keep dentures out if sore spots develop. Use salt water rinses as needed to aid healing. Return to clinic as scheduled for denture adjustment.  Call if problems arise before then. Patient dismissed in stable condition.  [name], DDS

## 2013-07-17 ENCOUNTER — Other Ambulatory Visit: Payer: Self-pay | Admitting: *Deleted

## 2013-07-20 ENCOUNTER — Encounter (HOSPITAL_COMMUNITY): Payer: Self-pay | Admitting: Dentistry

## 2013-07-20 ENCOUNTER — Encounter: Payer: Self-pay | Admitting: Thoracic Surgery (Cardiothoracic Vascular Surgery)

## 2013-07-20 ENCOUNTER — Ambulatory Visit (INDEPENDENT_AMBULATORY_CARE_PROVIDER_SITE_OTHER): Payer: Federal, State, Local not specified - PPO | Admitting: Thoracic Surgery (Cardiothoracic Vascular Surgery)

## 2013-07-20 ENCOUNTER — Ambulatory Visit (HOSPITAL_COMMUNITY): Payer: Self-pay | Admitting: Dentistry

## 2013-07-20 VITALS — BP 152/72 | HR 60 | Temp 98.4°F

## 2013-07-20 VITALS — BP 169/83 | HR 67 | Resp 20 | Ht 70.5 in | Wt 195.0 lb

## 2013-07-20 DIAGNOSIS — Z9889 Other specified postprocedural states: Secondary | ICD-10-CM

## 2013-07-20 DIAGNOSIS — I340 Nonrheumatic mitral (valve) insufficiency: Secondary | ICD-10-CM

## 2013-07-20 DIAGNOSIS — I059 Rheumatic mitral valve disease, unspecified: Secondary | ICD-10-CM

## 2013-07-20 DIAGNOSIS — Z463 Encounter for fitting and adjustment of dental prosthetic device: Secondary | ICD-10-CM

## 2013-07-20 DIAGNOSIS — K08109 Complete loss of teeth, unspecified cause, unspecified class: Secondary | ICD-10-CM

## 2013-07-20 DIAGNOSIS — I071 Rheumatic tricuspid insufficiency: Secondary | ICD-10-CM

## 2013-07-20 DIAGNOSIS — K062 Gingival and edentulous alveolar ridge lesions associated with trauma: Secondary | ICD-10-CM

## 2013-07-20 DIAGNOSIS — K1370 Unspecified lesions of oral mucosa: Secondary | ICD-10-CM

## 2013-07-20 DIAGNOSIS — I420 Dilated cardiomyopathy: Secondary | ICD-10-CM

## 2013-07-20 DIAGNOSIS — I428 Other cardiomyopathies: Secondary | ICD-10-CM

## 2013-07-20 DIAGNOSIS — I079 Rheumatic tricuspid valve disease, unspecified: Secondary | ICD-10-CM

## 2013-07-20 MED ORDER — ASPIRIN EC 81 MG PO TBEC: 81.0000 mg | DELAYED_RELEASE_TABLET | Freq: Every day | ORAL | Status: DC

## 2013-07-20 NOTE — Patient Instructions (Signed)
Patient to keep dentures out if sore spots develop. Use salt water rinses as needed to aid healing. Return to clinic as scheduled for denture adjustment in three months.   Call if problems arise before then.  [name], DDS

## 2013-07-20 NOTE — Patient Instructions (Signed)
Patient may resume normal physical activity without any particular limitations at this time.  Stop taking coumadin and begin aspirin 81 mg daily

## 2013-07-20 NOTE — Progress Notes (Signed)
[address]       [name] [address]             [phone]     CARDIOTHORACIC SURGERY OFFICE NOTE  Referring Provider is Bensimhon, Bevelyn Buckles, MD PCP is [name], [name], MD   HPI:  Patient returns for routine followup s/p minimally invasive mitral and tricuspid valve repair on 04/29/2013 for dilated non-ischemic cardiomyopathy with severe mitral and tricuspid regurgitation. His postoperative recovery has been uncomplicated.  He was last seen here in the office 05/18/2013 at which time he was doing very well. Since then he has been seen in followup by Russell Martin in the heart failure clinic. He has also been seen in followup on several occasions by Russell Martin and most recently he has gotten fit for dentures.  Russell Martin reports feeling exceptionally well. He has appreciated a remarkable improvement in his exercise tolerance. He rides a bicycle regularly and states that he can go quickly uphills without any limitations. He has no shortness of breath whatsoever. He has no significant pain in his chest. He is not having any tachypalpitations or dizzy spells. The remainder of his review of systems is unremarkable    Current Outpatient Prescriptions  Medication Sig Dispense Refill  . carvedilol (COREG) 12.5 MG tablet Take 1 tablet (12.5 mg total) by mouth 2 (two) times daily with a meal.  60 tablet  6  . digoxin (LANOXIN) 0.125 MG tablet Take 0.125 mg by mouth daily.      [name] losartan (COZAAR) 25 MG tablet Take 25 mg by mouth daily.      . Multiple Vitamin (MULTIVITAMIN WITH MINERALS) TABS Take 1 tablet by mouth daily.      . niacin 500 MG tablet Take 500 mg by mouth daily with breakfast.      . simvastatin (ZOCOR) 80 MG tablet Take 80 mg by mouth at bedtime.      [name] warfarin (COUMADIN) 5 MG tablet Take 1 tablet (5 mg total) by mouth daily at 6 PM.  100 tablet  1   No current facility-administered medications for this visit.      Physical Exam:   BP 169/83  Pulse 67   Resp 20  Ht 5' 10.5" (1.791 m)  Wt 195 lb (88.451 kg)  BMI 27.57 kg/m2  SpO2 97%  General:  Well-appearing  Chest:   Clear to auscultation with symmetrical breath sounds  CV:   Regular rate and rhythm without murmur  Incisions:  Completely healed  Abdomen:  Soft nontender  Extremities:  Warm and well-perfused  Diagnostic Tests:  Transthoracic Echocardiography  Patient: Russell, Martin MR #: [id number] Study Date: 04/30/2013 Gender: M Age: 62 Height: Weight: 89.1kg BSA: Pt. Status: Room: 2S12C  PERFORMING Russell Ka Rutherford Limerick MD SONOGRAPHER [facility], RDCS cc:  ------------------------------------------------------------ LV EF: 50% - 55%  ------------------------------------------------------------ Indications: Murmur 785.2.  ------------------------------------------------------------ History: PMH: Congestive heart failure. Risk factors: Hypertension. Diabetes mellitus.  ------------------------------------------------------------ Study Conclusions  - Left ventricle: The cavity size was mildly dilated. Wall thickness was increased in a pattern of mild LVH. Systolic function was normal. The estimated ejection fraction was in the range of 50% to 55%. There is akinesis of the basal posterior wall. - Aortic valve: Mild regurgitation. - Mitral valve: Prior procedures included surgical repair. Valve area by pressure half-time: 2.18cm^2. - Left atrium: The atrium was mildly dilated. - Tricuspid valve: Prior procedures included surgical repair. - Pulmonary arteries: Systolic pressure was  mildly increased. - Pericardium, extracardiac: There was a left pleural effusion. Impressions:  - Previous mitral and tricuspid valve repair; no significant MR/MS; trace TR; mildly elevated LVOT gradient of 2.5 m/s (no SAM noted and aortic valve opens well).  ------------------------------------------------------------ Labs, prior tests, procedures,  and surgery: Valve surgery (current admission, April 29, 2013). Mitral valve repair.  Valve surgery (current admission, April 29, 2013). Tricuspid valve repair. Transthoracic echocardiography. M-mode, complete 2D, spectral Doppler, and color Doppler. Weight: Weight: 89.1kg. Weight: 196lb. Patient status: Inpatient. Location: ICU/CCU  ------------------------------------------------------------  ------------------------------------------------------------ Left ventricle: The cavity size was mildly dilated. Wall thickness was increased in a pattern of mild LVH. Systolic function was normal. The estimated ejection fraction was in the range of 50% to 55%. Regional wall motion abnormalities: There is akinesis of the basal posterior wall.  ------------------------------------------------------------ Aortic valve: Trileaflet; normal thickness leaflets. Mobility was not restricted. Doppler: There was no stenosis. Mild regurgitation. Mean gradient: 12mm Hg (S). Peak gradient: 25mm Hg (S).  ------------------------------------------------------------ Aorta: Aortic root: The aortic root was normal in size.  ------------------------------------------------------------ Mitral valve: Prior procedures included surgical repair. Mobility was not restricted. Doppler: Transvalvular velocity was within the normal range. There was no evidence for stenosis. No regurgitation. Valve area by pressure half-time: 2.18cm^2.  ------------------------------------------------------------ Left atrium: The atrium was mildly dilated.  ------------------------------------------------------------ Right ventricle: The cavity size was normal. Pacer wire or catheter noted in right ventricle. Systolic function was normal.  ------------------------------------------------------------ Pulmonic valve: Doppler: Transvalvular velocity was within the normal range. There was no evidence for stenosis. Trivial  regurgitation.  ------------------------------------------------------------ Tricuspid valve: Prior procedures included surgical repair. Doppler: Transvalvular velocity was within the normal range. Trivial regurgitation.  ------------------------------------------------------------ Pulmonary artery: Systolic pressure was mildly increased.  ------------------------------------------------------------ Right atrium: The atrium was normal in size. Pacer wire or catheter noted in right atrium.  ------------------------------------------------------------ Pericardium: There was no pericardial effusion.  ------------------------------------------------------------ Systemic veins: Inferior vena cava: The vessel was normal in size.  ------------------------------------------------------------ Pleura: There was a left pleural effusion.  ------------------------------------------------------------  2D measurements Normal Doppler measurements Normal Left ventricle Aortic valve LVID ED, 53.9 mm 43-52 Peak vel, S 248 cm/s ------ chord, PLAX Mean vel, S 152 cm/s ------ LVID ES, 47.4 mm 23-38 VTI, S 37.8 cm ------ chord, PLAX Mean 12 mm ------ FS, chord, 12 % >29 gradient, S Hg PLAX Peak 25 mm ------ LVPW, ED 11 mm ------ gradient, S Hg IVS/LVPW 1.2 <1.3 Regurg PHT 669 ms ------ ratio, ED Mitral valve Ventricular septum Pressure 101 ms ------ IVS, ED 13.2 mm ------ half-time Aorta Area (PHT) 2.18 cm^2 ------ Root diam, 32 mm ------ Tricuspid valve ED Regurg peak 276 cm/s ------ Left atrium vel AP dim 45 mm ------ Peak RV-RA 30 mm ------ gradient, S Hg Max regurg 276 cm/s ------ vel  ------------------------------------------------------------ Prepared and Electronically Authenticated by  Olga Millers [id number]:[id number]    Impression:  Patient is doing exceptionally well less than 3 months following him invasive mitral valve and tricuspid valve repair for dilated  nonischemic cardiomyopathy. Followup echocardiogram demonstrates improved left ventricular systolic function with ejection fraction greater than 50% and no residual mitral or tricuspid regurgitation. The patient has enjoyed dramatic improvement in his exercise tolerance.    Plan:  I've instructed the patient to go ahead and stop taking Coumadin and resume taking aspirin 81 mg daily. He has been released to completely unrestricted physical activity. He will continue to followup intermittently in the heart failure clinic. We will have him return in one year to see  how he is getting along.   Salvatore Decent. Cornelius Moras, MD 07/20/2013 10:44 AM

## 2013-07-20 NOTE — Progress Notes (Signed)
07/20/2013  Patient:            Russell Martin Date of Birth:  [birth year] MRN:                [id number]  BP 152/72  Pulse 60  Temp(Src) 98.4 F (36.9 C) (Oral)  [name] presents for evaluation of recently inserted upper and lower complete dentures. SUBJECTIVE: Patient is complaining of slight irritation to the mandibular right buccal extension of the premolar area. OBJECTIVE: The patient is edentulous. There is slight erythema of the buccal area of premolars. Procedure: Pressure indicating paste applied to dentures. Adjustments made as needed.  Thick PIP to mandibular buccal and lingual extensions. Adjusted as needed [place]. Occlusion evaluated and no adjustments made for Centric Relation and protrusive strokes. Patient accepts results. Patient to keep dentures out if sore spots develop. Use salt water rinses as needed to aid healing. Return to clinic as scheduled for denture adjustment in three months.   Call if problems arise before then. Patient dismissed in stable condition.  [name], DDS

## 2013-10-13 ENCOUNTER — Other Ambulatory Visit: Payer: Self-pay | Admitting: *Deleted

## 2013-10-13 MED ORDER — SIMVASTATIN 80 MG PO TABS: 80.0000 mg | ORAL_TABLET | Freq: Every day | ORAL | Status: DC

## 2013-10-14 ENCOUNTER — Other Ambulatory Visit: Payer: Self-pay | Admitting: Family Medicine

## 2013-10-14 ENCOUNTER — Ambulatory Visit
Admission: RE | Admit: 2013-10-14 | Discharge: 2013-10-14 | Disposition: A | Discharge status: Home / Self Care | Payer: Federal, State, Local not specified - PPO | Source: Ambulatory Visit | Attending: Family Medicine | Admitting: Family Medicine

## 2013-10-14 DIAGNOSIS — R05 Cough: Secondary | ICD-10-CM

## 2013-10-14 DIAGNOSIS — R059 Cough, unspecified: Secondary | ICD-10-CM

## 2013-10-19 ENCOUNTER — Encounter (HOSPITAL_COMMUNITY): Payer: Self-pay | Admitting: Dentistry

## 2013-10-26 ENCOUNTER — Encounter (HOSPITAL_COMMUNITY): Payer: Medicaid - Dental | Admitting: Dentistry

## 2013-11-02 ENCOUNTER — Ambulatory Visit (HOSPITAL_COMMUNITY): Payer: Medicaid - Dental | Admitting: Dentistry

## 2013-11-02 ENCOUNTER — Encounter (HOSPITAL_COMMUNITY): Payer: Self-pay | Admitting: Dentistry

## 2013-11-02 ENCOUNTER — Encounter (INDEPENDENT_AMBULATORY_CARE_PROVIDER_SITE_OTHER): Payer: Self-pay

## 2013-11-02 VITALS — BP 135/80 | HR 69 | Temp 98.9°F

## 2013-11-02 DIAGNOSIS — Z972 Presence of dental prosthetic device (complete) (partial): Secondary | ICD-10-CM

## 2013-11-02 DIAGNOSIS — Z463 Encounter for fitting and adjustment of dental prosthetic device: Secondary | ICD-10-CM

## 2013-11-02 DIAGNOSIS — K08109 Complete loss of teeth, unspecified cause, unspecified class: Secondary | ICD-10-CM

## 2013-11-02 DIAGNOSIS — K000 Anodontia: Principal | ICD-10-CM

## 2013-11-02 NOTE — Progress Notes (Signed)
11/02/2013  Patient:            Russell Martin Date of Birth:  [birth year] MRN:                [id number]  BP 135/80  Pulse 69  Temp(Src) 98.9 F (37.2 C) (Oral)  [name] presents for evaluation of upper and lower complete dentures. SUBJECTIVE: Patient is not complaining of any denture irritation. OBJECTIVE: The patient is edentulous. There is evidence of denture irritation or erythema. Dentures are slightly dirty and denture cleaning instructions were provided again. Patient had not been brushing the dentures with his denture brush and soap at bedtime to Procedure: Pressure indicating paste applied to dentures. Adjustments made as needed.  [place]. Occlusion evaluated and no adjustments made for Centric Relation and protrusive strokes. Patient accepts results. Patient to keep dentures out if sore spots develop. Use salt water rinses as needed to aid healing. Return to clinic as scheduled for denture adjustment in three months.   Call if problems arise before then. Patient dismissed in stable condition.  [name], DDS

## 2013-11-02 NOTE — Patient Instructions (Signed)
Patient to keep dentures out if sore spots develop. Use salt water rinses as needed to aid healing. Return to clinic as scheduled for denture adjustment in three months.   Call if problems arise before then. Dr. Kristin Bruins

## 2013-11-12 ENCOUNTER — Encounter (HOSPITAL_COMMUNITY): Payer: Self-pay | Admitting: Cardiology

## 2013-12-02 ENCOUNTER — Ambulatory Visit (INDEPENDENT_AMBULATORY_CARE_PROVIDER_SITE_OTHER): Payer: Federal, State, Local not specified - PPO | Admitting: Internal Medicine

## 2013-12-02 ENCOUNTER — Encounter: Payer: Self-pay | Admitting: Internal Medicine

## 2013-12-02 VITALS — BP 156/78 | HR 60 | Ht 70.5 in | Wt 211.0 lb

## 2013-12-02 DIAGNOSIS — I10 Essential (primary) hypertension: Secondary | ICD-10-CM

## 2013-12-02 DIAGNOSIS — I5022 Chronic systolic (congestive) heart failure: Secondary | ICD-10-CM

## 2013-12-02 DIAGNOSIS — Z950 Presence of cardiac pacemaker: Secondary | ICD-10-CM

## 2013-12-02 DIAGNOSIS — I442 Atrioventricular block, complete: Secondary | ICD-10-CM

## 2013-12-02 LAB — MDC_IDC_ENUM_SESS_TYPE_INCLINIC
Battery Voltage: 2.95 V
Brady Statistic RA Percent Paced: 19 %
Brady Statistic RV Percent Paced: 99.97 %
Date Time Interrogation Session: 20150325104922
Implantable Pulse Generator Serial Number: 2882239
Lead Channel Impedance Value: 325 Ohm
Lead Channel Impedance Value: 387.5 Ohm
Lead Channel Impedance Value: 912.5 Ohm
Lead Channel Pacing Threshold Amplitude: 0.625 V
Lead Channel Pacing Threshold Amplitude: 0.75 V
Lead Channel Pacing Threshold Amplitude: 0.75 V
Lead Channel Pacing Threshold Amplitude: 0.875 V
Lead Channel Pacing Threshold Pulse Width: 0.4 ms
Lead Channel Pacing Threshold Pulse Width: 0.4 ms
Lead Channel Pacing Threshold Pulse Width: 0.5 ms
Lead Channel Sensing Intrinsic Amplitude: 12 mV
Lead Channel Sensing Intrinsic Amplitude: 3 mV
Lead Channel Setting Pacing Amplitude: 2 V
Lead Channel Setting Pacing Amplitude: 2 V
Lead Channel Setting Pacing Pulse Width: 0.4 ms
Lead Channel Setting Sensing Sensitivity: 5 mV
MDC IDC MSMT BATTERY REMAINING LONGEVITY: 79.2 mo
MDC IDC MSMT LEADCHNL RA PACING THRESHOLD PULSEWIDTH: 0.4 ms
MDC IDC SET LEADCHNL LV PACING PULSEWIDTH: 0.5 ms
MDC IDC SET LEADCHNL RA PACING AMPLITUDE: 2 V

## 2013-12-02 MED ORDER — LOSARTAN POTASSIUM 50 MG PO TABS: 50.0000 mg | ORAL_TABLET | Freq: Every day | ORAL | Status: DC

## 2013-12-02 NOTE — Patient Instructions (Addendum)
Your physician wants you to follow-up in: 12 months with Dr Court Joy will receive a reminder letter in the mail two months in advance. If you don't receive a letter, please call our office to schedule the follow-up appointment.    Remote monitoring is used to monitor your Pacemaker or ICD from home. This monitoring reduces the number of office visits required to check your device to one time per year. It allows [place] to keep an eye on the functioning of your device to ensure it is working properly. You are scheduled for a device check from home on 03/08/14. You may send your transmission at any time that day. If you have a wireless device, the transmission will be sent automatically. After your physician reviews your transmission, you will receive a postcard with your next transmission date.  Your physician has recommended you make the following change in your medication:  1) Increase Cozaar to 50mg  daily

## 2013-12-02 NOTE — Assessment & Plan Note (Signed)
His symptoms are class 2A. He will increase his dose of cozaar to 50 mg daily. He will maintain a low sodium diet.

## 2013-12-02 NOTE — Progress Notes (Signed)
HPI Russell Martin returns today for followup. He is a very pleasant 63 year old man with complete heart block, status post permanent pacemaker insertion, who developed worsening left ventricular dysfunction. He had a paced QRS duration of 200 ms, and underwent biventricular upgrade several months ago. Repeat echo demonstrated improvement in his left ventricular function. His ejection fraction increased from 30% to 40%. The patient continues to have dyspnea. His echocardiogram demonstrated moderate to severe mitral regurgitation. His pulmonary pressures are elevated. He has not had syncope.  He does have hypertension, and was placed on Losartan. He returns today for followup. He is improved. No Known Allergies   Current Outpatient Prescriptions  Medication Sig Dispense Refill  . aspirin EC 81 MG tablet Take 1 tablet (81 mg total) by mouth daily.      . carvedilol (COREG) 12.5 MG tablet Take 1 tablet (12.5 mg total) by mouth 2 (two) times daily with a meal.  60 tablet  6  . digoxin (LANOXIN) 0.125 MG tablet Take 0.125 mg by mouth daily.      [name] losartan (COZAAR) 25 MG tablet Take 25 mg by mouth daily.      . Multiple Vitamin (MULTIVITAMIN WITH MINERALS) TABS Take 1 tablet by mouth every other day.       . niacin 500 MG tablet Take 500 mg by mouth daily with breakfast.      . simvastatin (ZOCOR) 80 MG tablet Take 1 tablet (80 mg total) by mouth at bedtime.  90 tablet  1   No current facility-administered medications for this visit.     Past Medical History  Diagnosis Date  . CHF (congestive heart failure)     a. NICM EF 35% - no CAD by cath 09/2012. b. s/p upgrade to Bi-V pacemaker 11/2012 (did not get ICD as Dr. Ladona Ridgel expects his EF will improve with bi-V pacing).  . Hyperlipidemia   . Hypertension     pt denies HTN  . CHB (complete heart block)     a. Pacer initially placed ~2002.   . Mitral valve regurgitation   . Pacemaker     st jude    greg taylor  . S/P mitral valve repair 04/29/2013   72mm Sorin Memo 3D ring annuloplasty via right mini thoracotomy  . S/P tricuspid valve repair 04/29/2013    28 mm Edwards mc3 ring annuloplasty via right mini thoracotomy    ROS:   All systems reviewed and negative except as noted in the HPI.   Past Surgical History  Procedure Laterality Date  . Pacemaker placement    . Cardiac catheterization      clean  . Multiple extractions with alveoloplasty N/A 03/20/2013    Procedure: Extraction of tooth #'s 1,6,7,8,9,10,16,17,20,21,22,23,24,25,26,27,28, 29 with alveoloplasty;  Surgeon: [name], DDS;  Location: [facility] OR;  Service: Oral Surgery;  Laterality: N/A;  . Tee without cardioversion N/A 04/09/2013    Procedure: TRANSESOPHAGEAL ECHOCARDIOGRAM (TEE);  Surgeon: Dolores Patty, MD;  Location: [facility] ENDOSCOPY;  Service: Cardiovascular;  Laterality: N/A;  . Mitral valve repair Right 04/29/2013    Procedure: MINIMALLY INVASIVE MITRAL VALVE REPAIR (MVR);  Surgeon: [name], MD;  Location: [facility] OR;  Service: Open Heart Surgery;  Laterality: Right;  . Minimally invasive tricuspid valve repair Right 04/29/2013    Procedure: MINIMALLY INVASIVE TRICUSPID VALVE REPAIR;  Surgeon: [name], MD;  Location: [facility] OR;  Service: Open Heart Surgery;  Laterality: Right;  . Intraoperative transesophageal echocardiogram N/A 04/29/2013    Procedure:  INTRAOPERATIVE TRANSESOPHAGEAL ECHOCARDIOGRAM;  Surgeon: Purcell Nailslarence H Owen, MD;  [facility] OR;  Service: Open Heart Surgery;  Laterality: N/A;     No family history on file.   History   Social History  . Marital Status: Married    Spouse Name: N/A    Number of Children: 1  . Years of Education: N/A   Occupational History  .     Social History Main Topics  . Smoking status: Former Games developermoker  . Smokeless tobacco: Never Used  . Alcohol Use: No  . Drug Use: No  . Sexual Activity: Yes   Other Topics Concern  . Not on file   Social History Narrative   Patient is married. Patient has one grown  son that lives in [place].   Patient with a history of smoking but quit approximately 10 years ago. Patient denies use of alcohol or other illicit drugs. Patient has never used smokeless tobacco.     BP 156/78  Pulse 60  Ht 5' 10.5" (1.791 m)  Wt 211 lb (95.709 kg)  BMI 29.84 kg/m2  Physical Exam:  Well appearing middle-aged man,NAD HEENT: Unremarkable Neck:  7 cm JVD, no thyromegally Lungs:  Clear with no wheezes, rales, or rhonchi. Well-healed pacemaker incision. HEART:  Regular rate rhythm, no murmurs, no rubs, no clicks Abd:  soft, positive bowel sounds, no organomegally, no rebound, no guarding Ext:  2 plus pulses, no edema, no cyanosis, no clubbing Skin:  No rashes no nodules Neuro:  CN II through XII intact, motor grossly intact   DEVICE  Normal device function.  See PaceArt for details.   Assess/Plan:

## 2013-12-02 NOTE — Assessment & Plan Note (Signed)
His St. Jude DDD biv PM is working normally. Will recheck in several months.

## 2013-12-02 NOTE — Assessment & Plan Note (Signed)
His blood pressure is elevated. He will maintain a low sodium diet.

## 2013-12-04 ENCOUNTER — Encounter: Payer: Self-pay | Admitting: Internal Medicine

## 2013-12-08 ENCOUNTER — Encounter: Payer: Self-pay | Admitting: Internal Medicine

## 2014-01-18 ENCOUNTER — Other Ambulatory Visit (HOSPITAL_COMMUNITY): Payer: Self-pay

## 2014-01-18 ENCOUNTER — Other Ambulatory Visit (HOSPITAL_COMMUNITY): Payer: Self-pay | Admitting: Internal Medicine

## 2014-01-18 MED ORDER — DIGOXIN 125 MCG PO TABS: 0.1250 mg | ORAL_TABLET | Freq: Every day | ORAL | Status: DC

## 2014-02-02 ENCOUNTER — Encounter (HOSPITAL_COMMUNITY): Payer: Self-pay | Admitting: Dentistry

## 2014-02-09 ENCOUNTER — Other Ambulatory Visit: Payer: Self-pay | Admitting: Internal Medicine

## 2014-02-15 ENCOUNTER — Encounter (HOSPITAL_COMMUNITY): Payer: Self-pay | Admitting: Dentistry

## 2014-02-19 ENCOUNTER — Encounter (HOSPITAL_COMMUNITY): Payer: Self-pay | Admitting: Dentistry

## 2014-02-19 ENCOUNTER — Ambulatory Visit (HOSPITAL_COMMUNITY): Payer: Medicaid - Dental | Admitting: Dentistry

## 2014-02-19 ENCOUNTER — Encounter (INDEPENDENT_AMBULATORY_CARE_PROVIDER_SITE_OTHER): Payer: Self-pay

## 2014-02-19 VITALS — BP 157/83 | HR 60 | Temp 98.5°F

## 2014-02-19 DIAGNOSIS — K000 Anodontia: Principal | ICD-10-CM

## 2014-02-19 DIAGNOSIS — K08109 Complete loss of teeth, unspecified cause, unspecified class: Secondary | ICD-10-CM

## 2014-02-19 DIAGNOSIS — Z9889 Other specified postprocedural states: Secondary | ICD-10-CM

## 2014-02-19 DIAGNOSIS — Z463 Encounter for fitting and adjustment of dental prosthetic device: Secondary | ICD-10-CM

## 2014-02-19 DIAGNOSIS — Z972 Presence of dental prosthetic device (complete) (partial): Secondary | ICD-10-CM

## 2014-02-19 NOTE — Patient Instructions (Signed)
Patient is to keep dentures out if sore spots arise. Patient to use salt water rinses as needed to aid healing. Patient return to clinic as scheduled or is to call if problems arise before then. Dr. Kristin Bruins

## 2014-02-19 NOTE — Progress Notes (Signed)
02/19/2014  Patient:            Russell Martin Date of Birth:  Oct 20, 1950 MRN:                [id number]  BP 157/83  Pulse 60  Temp(Src) 98.5 F (36.9 C) (Oral)   Russell Martin is a 63 year old male that presents for periodic oral exam and evaluation of upper lower complete dentures.   Medical Hx Update:  Past Medical History  Diagnosis Date  . CHF (congestive heart failure)     a. NICM EF 35% - no CAD by cath 09/2012. b. s/p upgrade to Bi-V pacemaker 11/2012 (did not get ICD as Dr. Ladona Martin expects his EF will improve with bi-V pacing).  . Hyperlipidemia   . Hypertension     pt denies HTN  . CHB (complete heart block)     a. Pacer initially placed ~2002.   . Mitral valve regurgitation   . Pacemaker     st jude    Russell Martin  . S/P mitral valve repair 04/29/2013    21mm Sorin Memo 3D ring annuloplasty via right mini thoracotomy  . S/P tricuspid valve repair 04/29/2013    28 mm Edwards mc3 ring annuloplasty via right mini thoracotomy  .  ALLERGIES/ADVERSE DRUG REACTIONS: No Known Allergies  MEDICATIONS: Current Outpatient Prescriptions  Medication Sig Dispense Refill  . aspirin EC 81 MG tablet Take 1 tablet (81 mg total) by mouth daily.      . carvedilol (COREG) 12.5 MG tablet Take 1 tablet (12.5 mg total) by mouth 2 (two) times daily with a meal.  60 tablet  6  . DIGOX 125 MCG tablet TAKE 1 TABLET BY MOUTH DAILY  90 tablet  3  . losartan (COZAAR) 50 MG tablet Take 1 tablet (50 mg total) by mouth daily.  90 tablet  3  . Multiple Vitamin (MULTIVITAMIN WITH MINERALS) TABS Take 1 tablet by mouth every other day.       . niacin 500 MG tablet Take 500 mg by mouth daily with breakfast.      . simvastatin (ZOCOR) 80 MG tablet Take 1 tablet (80 mg total) by mouth at bedtime.  90 tablet  1   No current facility-administered medications for this visit.    C/C: Patient presents for denture recall. Patient denies having any dental problems.  HPI:  Russell Martin was seen and 03/17/2013  as part of a pre-heart valve surgery dental protocol. Patient had remaining teeth extracted with alveoloplasty and pre-prosthetic surgery as indicated in the operating room on 03/20/2013. After adequate healing, the dentures were fabricated and inserted in on 06/18/2013. Patient has had multiple denture adjustment appointments since that time. The patient now presents for periodic oral examination and evaluation of the upper and lower complete dentures.  DENTAL EXAM: General: Patient is a tall, well-developed, well-nourished male in no acute distress. Vitals: BP 157/83  Pulse 60  Temp(Src) 98.5 F (36.9 C) (Oral) Extraoral Exam: There is no palpable lymphadenopathy. The patient denies acute TMJ symptoms. Intraoral  Exam: The patient has normal saliva. There is no evidence of denture irritation erythema or other soft tissue lesions. Dentition: The patient is edentulous. Prosthodontic: The patient has an upper and lower complete dentures. These are stable and retentive. Pressure indicating paste was applied to dentures and they were adjusted as needed minimally. The dentures were polished. Occlusion:  The occlusion of the upper and lower complete dentures is acceptable.No adjustments  were needed.  Assessments: 1. The patient is edentulous. 2. The patient has acceptable upper and lower complete dentures.   Plan:  1. Patient is to keep dentures out if sore spots arise. 2. Patient to use salt water rinses as needed to aid healing. 3. Patient return to clinic as scheduled. Patient to call if problems arise before then.  Russell Martin, DDS

## 2014-03-08 ENCOUNTER — Encounter: Payer: Federal, State, Local not specified - PPO | Admitting: *Deleted

## 2014-03-08 ENCOUNTER — Telehealth: Payer: Self-pay | Admitting: Cardiology

## 2014-03-08 ENCOUNTER — Telehealth: Payer: Self-pay | Admitting: Internal Medicine

## 2014-03-08 NOTE — Telephone Encounter (Signed)
Confirmed remote transmission with pt wife.  

## 2014-03-08 NOTE — Telephone Encounter (Signed)
New message         Pt would like to know if you received his transmission and if so what time

## 2014-03-08 NOTE — Telephone Encounter (Signed)
Transmission not received. Pt will send manual transmission tonight when he gets home.

## 2014-03-09 ENCOUNTER — Encounter: Payer: Self-pay | Admitting: Cardiology

## 2014-03-09 NOTE — Telephone Encounter (Signed)
Pt sent transmission and saw yellow star icon illuminate.  We have not received his remote. Gave pt tech svcs #. Pt will contact them this evening when he returns home. Pt will call me again to update progress w/ SJM.

## 2014-03-16 ENCOUNTER — Other Ambulatory Visit (HOSPITAL_COMMUNITY): Payer: Self-pay | Admitting: *Deleted

## 2014-03-16 MED ORDER — NIACIN 500 MG PO TABS: 500.0000 mg | ORAL_TABLET | Freq: Every day | ORAL | Status: DC

## 2014-03-19 ENCOUNTER — Telehealth: Payer: Self-pay | Admitting: *Deleted

## 2014-03-19 NOTE — Telephone Encounter (Signed)
Pt received new Merlin monitor. I updated his serial number for his device and his home monitor on [employer]. Gave pt instruction to send manual transmission. Transmission was not received but pt states he saw star icon illuminate.

## 2014-04-13 ENCOUNTER — Other Ambulatory Visit (HOSPITAL_COMMUNITY): Payer: Self-pay | Admitting: Anesthesiology

## 2014-04-13 ENCOUNTER — Other Ambulatory Visit (HOSPITAL_COMMUNITY): Payer: Self-pay | Admitting: Internal Medicine

## 2014-05-16 ENCOUNTER — Other Ambulatory Visit (HOSPITAL_COMMUNITY): Payer: Self-pay | Admitting: Internal Medicine

## 2014-05-19 ENCOUNTER — Other Ambulatory Visit (HOSPITAL_COMMUNITY): Payer: Self-pay | Admitting: Internal Medicine

## 2014-05-19 DIAGNOSIS — I509 Heart failure, unspecified: Secondary | ICD-10-CM

## 2014-05-20 ENCOUNTER — Telehealth (HOSPITAL_COMMUNITY): Payer: Self-pay | Admitting: Vascular Surgery

## 2014-05-20 ENCOUNTER — Encounter (HOSPITAL_COMMUNITY): Payer: Self-pay | Admitting: Cardiology

## 2014-05-20 NOTE — Telephone Encounter (Signed)
Refill carvedolol

## 2014-05-20 NOTE — Telephone Encounter (Signed)
Duplicate request Refill addressed 05/19/14

## 2014-06-22 ENCOUNTER — Encounter: Payer: Self-pay | Admitting: Cardiology

## 2014-06-30 ENCOUNTER — Encounter (HOSPITAL_COMMUNITY): Payer: Self-pay

## 2014-06-30 ENCOUNTER — Ambulatory Visit (HOSPITAL_COMMUNITY)
Admission: RE | Admit: 2014-06-30 | Discharge: 2014-06-30 | Disposition: A | Discharge status: Home / Self Care | Payer: Federal, State, Local not specified - PPO | Source: Ambulatory Visit | Attending: Internal Medicine | Admitting: Internal Medicine

## 2014-06-30 VITALS — BP 128/60 | HR 76 | Wt 224.4 lb

## 2014-06-30 DIAGNOSIS — Z952 Presence of prosthetic heart valve: Secondary | ICD-10-CM | POA: Insufficient documentation

## 2014-06-30 DIAGNOSIS — Z9889 Other specified postprocedural states: Secondary | ICD-10-CM

## 2014-06-30 DIAGNOSIS — E785 Hyperlipidemia, unspecified: Secondary | ICD-10-CM | POA: Diagnosis not present

## 2014-06-30 DIAGNOSIS — I442 Atrioventricular block, complete: Secondary | ICD-10-CM | POA: Diagnosis not present

## 2014-06-30 DIAGNOSIS — I10 Essential (primary) hypertension: Secondary | ICD-10-CM | POA: Insufficient documentation

## 2014-06-30 DIAGNOSIS — I429 Cardiomyopathy, unspecified: Secondary | ICD-10-CM | POA: Diagnosis not present

## 2014-06-30 DIAGNOSIS — Z95810 Presence of automatic (implantable) cardiac defibrillator: Secondary | ICD-10-CM | POA: Diagnosis not present

## 2014-06-30 DIAGNOSIS — I340 Nonrheumatic mitral (valve) insufficiency: Secondary | ICD-10-CM | POA: Diagnosis not present

## 2014-06-30 DIAGNOSIS — I5022 Chronic systolic (congestive) heart failure: Secondary | ICD-10-CM | POA: Insufficient documentation

## 2014-06-30 DIAGNOSIS — I071 Rheumatic tricuspid insufficiency: Secondary | ICD-10-CM | POA: Insufficient documentation

## 2014-06-30 LAB — LIPID PANEL
Cholesterol: 156 mg/dL (ref 0–200)
HDL: 67 mg/dL (ref >39)
LDL CALC: 57 mg/dL (ref 0–99)
TRIGLYCERIDES: 159 mg/dL — AB (ref <150)
Total CHOL/HDL Ratio: 2.3 RATIO
VLDL: 32 mg/dL (ref 0–40)

## 2014-06-30 LAB — BASIC METABOLIC PANEL WITH GFR
Anion gap: 12 (ref 5–15)
BUN: 15 mg/dL (ref 6–23)
CO2: 26 meq/L (ref 19–32)
Calcium: 9.6 mg/dL (ref 8.4–10.5)
Chloride: 106 meq/L (ref 96–112)
Creatinine, Ser: 1.31 mg/dL (ref 0.50–1.35)
GFR calc Af Amer: 66 mL/min — ABNORMAL LOW
GFR calc non Af Amer: 57 mL/min — ABNORMAL LOW
Glucose, Bld: 84 mg/dL (ref 70–99)
Potassium: 4.8 meq/L (ref 3.7–5.3)
Sodium: 144 meq/L (ref 137–147)

## 2014-06-30 LAB — DIGOXIN LEVEL: Digoxin Level: 0.5 ng/mL — ABNORMAL LOW (ref 0.8–2.0)

## 2014-06-30 NOTE — Progress Notes (Signed)
Patient ID: Russell Martin, male   DOB: [birth year], 63 y.o.   MRN: [id number]  EP: Russell Martin  PCP: Russell Martin  Cardiologist: Russell Martin  HPI:   Russell Martin is a 63 year old with PMH of systolic HF due to NICM (EF 35%) s/p St Jude CRT-P, complete heart block 2001 S/P permanent pacemaker, gout, HTN, Russell, TR, and hyperlipidemia. He was previously followed by Russell Martin.  09/2012 LHC/RHC No CAD.  RHC RA: 11  RV: 58-60/10  PA: 60/28; mean 44  PCWP: 17; v wave 25  LV: 116/12  CO: 5.4 (Thermo); 5.3 l/m  CI: 2.6 2.5 l/m/m2   01/27/13 ECHO EF 45-45% Severe Russell Mod-Severe TR  04/09/13 TEE EF 40% Mod TR Severe central Russell  8/14 echo EF 50-55%, s/p TV/MV repair with no TR/Russell, aortic valve opens well but LVOT gradient to 25 mmHg peak.   Patient is s/p minimally invasive mitral and tricuspid valve repair on 04/29/2013 for dilated non-ischemic cardiomyopathy with severe mitral and tricuspid regurgitation.  Follow up: Patient has been doing quite well since last appointment.  He is working full time as a Electrical engineer.  No exertional dyspnea or chest pain.  No orthopnea or PND.  Plays basketball with his grandson.    Labs (9/14): K 4.2, creatinine 1.36, digoxin 0.7  ECG: NSR, BiV paced  SH: Lives with wife in [place]  ROS: All systems negative except as listed in HPI, PMH and Problem List.  Past Medical History  Diagnosis Date  . CHF (congestive heart failure)     a. NICM EF 35% - no CAD by cath 09/2012. b. s/p upgrade to Bi-V pacemaker 11/2012 (did not get ICD as Russell. Ladona Martin expects his EF will improve with bi-V pacing).  . Hyperlipidemia   . Hypertension     pt denies HTN  . CHB (complete heart block)     a. Pacer initially placed ~2002.   . Mitral valve regurgitation   . Pacemaker     st jude    Russell Martin  . S/P mitral valve repair 04/29/2013    82mm Sorin Memo 3D ring annuloplasty via right mini thoracotomy  . S/P tricuspid valve repair 04/29/2013    28 mm Edwards mc3 ring annuloplasty via  right mini thoracotomy    Current Outpatient Prescriptions  Medication Sig Dispense Refill  . aspirin EC 81 MG tablet Take 1 tablet (81 mg total) by mouth daily.      . carvedilol (COREG) 12.5 MG tablet TAKE 1 TABLET BY MOUTH TWICE DAILY WITH A MEAL  60 tablet  5  . DIGOX 125 MCG tablet TAKE 1 TABLET BY MOUTH DAILY  90 tablet  3  . losartan (COZAAR) 50 MG tablet Take 1 tablet (50 mg total) by mouth daily.  90 tablet  3  . Multiple Vitamin (MULTIVITAMIN WITH MINERALS) TABS Take 1 tablet by mouth every other day.       . niacin 500 MG tablet Take 1 tablet (500 mg total) by mouth daily with breakfast.  30 tablet  3  . simvastatin (ZOCOR) 80 MG tablet Take 1 tablet (80 mg total) by mouth at bedtime.  90 tablet  1   No current facility-administered medications for this encounter.   PHYSICAL EXAM: Filed Vitals:   06/30/14 1448  BP: 128/60  Pulse: 76  Weight: 224 lb 6.4 oz (101.787 kg)  SpO2: 98%   General:  Well appearing. No resp difficulty; wife present HEENT: normal Neck:  supple. JVP flat. Carotids 2+ bilaterally; no bruits. No lymphadenopathy or thryomegaly appreciated. Cor: PMI normal. Regular rate & rhythm. No rubs, gallops.  1/6 SEM RUSB. Lungs: clear Abdomen: soft, nontender, nondistended. No hepatosplenomegaly. No bruits or masses. Good bowel sounds. Extremities: no cyanosis, clubbing, rash, edema Neuro: alert & orientedx3, cranial nerves grossly intact. Moves all 4 extremities w/o difficulty. Affect pleasant.  ASSESSMENT & PLAN:  1) Chronic systolic HF:  NICM, EF improved to 50-55% in 8/14 s/p MV and TV repair and CRT (improved).  NYHA I symptoms. Volume status good. - Check BMET and digoxin levels today.  - Continue Coreg and losartan.  - Will repeat echo and if EF is >50%, will stop digoxin.  2) MV/TV repair: Will arrange for echocardiogram.  3) HTN: BP controlled.  Russell Martin 06/30/2014 4:35 PM

## 2014-06-30 NOTE — Patient Instructions (Addendum)
Labs today.  Your physician has requested that you have an echocardiogram. Echocardiography is a painless test that uses sound waves to create images of your heart. It provides your doctor with information about the size and shape of your heart and how well your heart's chambers and valves are working. This procedure takes approximately one hour. There are no restrictions for this procedure.  Your physician recommends that you schedule a follow-up appointment in: 1 year  Do the following things EVERYDAY: 1) Weigh yourself in the morning before breakfast. Write it down and keep it in a log. 2) Take your medicines as prescribed 3) Eat low salt foods-Limit salt (sodium) to 2000 mg per day.  4) Stay as active as you can everyday 5) Limit all fluids for the day to less than 2 liters 6)

## 2014-07-02 ENCOUNTER — Other Ambulatory Visit (HOSPITAL_COMMUNITY): Payer: Self-pay | Admitting: Internal Medicine

## 2014-07-05 ENCOUNTER — Other Ambulatory Visit (HOSPITAL_COMMUNITY): Payer: Self-pay | Admitting: Internal Medicine

## 2014-07-09 ENCOUNTER — Ambulatory Visit (HOSPITAL_COMMUNITY)
Admission: RE | Admit: 2014-07-09 | Discharge: 2014-07-09 | Disposition: A | Discharge status: Home / Self Care | Payer: Federal, State, Local not specified - PPO | Source: Ambulatory Visit | Attending: Family Medicine | Admitting: Family Medicine

## 2014-07-09 DIAGNOSIS — Z9889 Other specified postprocedural states: Secondary | ICD-10-CM | POA: Insufficient documentation

## 2014-07-09 DIAGNOSIS — I059 Rheumatic mitral valve disease, unspecified: Secondary | ICD-10-CM

## 2014-07-09 NOTE — Progress Notes (Signed)
  Echocardiogram 2D Echocardiogram has been performed.  Russell Martin FRANCES 07/09/2014, 3:47 PM

## 2014-07-12 ENCOUNTER — Telehealth (HOSPITAL_COMMUNITY): Payer: Self-pay | Admitting: *Deleted

## 2014-07-12 NOTE — Telephone Encounter (Signed)
-----   Message from Laurey Morale, MD sent at 07/11/2014  8:42 PM EST ----- EF improved to 60-65%, mild mitral stenosis.

## 2014-07-12 NOTE — Telephone Encounter (Signed)
Digoxin stopped per last ov note

## 2014-07-18 ENCOUNTER — Encounter: Payer: Self-pay | Admitting: *Deleted

## 2014-07-26 ENCOUNTER — Ambulatory Visit (INDEPENDENT_AMBULATORY_CARE_PROVIDER_SITE_OTHER): Payer: Federal, State, Local not specified - PPO | Admitting: Thoracic Surgery (Cardiothoracic Vascular Surgery)

## 2014-07-26 ENCOUNTER — Encounter: Payer: Self-pay | Admitting: Thoracic Surgery (Cardiothoracic Vascular Surgery)

## 2014-07-26 VITALS — BP 126/75 | HR 70 | Resp 20 | Ht 70.5 in | Wt 225.0 lb

## 2014-07-26 DIAGNOSIS — I509 Heart failure, unspecified: Secondary | ICD-10-CM

## 2014-07-26 DIAGNOSIS — I340 Nonrheumatic mitral (valve) insufficiency: Secondary | ICD-10-CM

## 2014-07-26 DIAGNOSIS — Z9889 Other specified postprocedural states: Secondary | ICD-10-CM

## 2014-07-26 DIAGNOSIS — I071 Rheumatic tricuspid insufficiency: Secondary | ICD-10-CM

## 2014-07-26 NOTE — Progress Notes (Signed)
[address]       Russell Martin [address]             [phone]     CARDIOTHORACIC SURGERY OFFICE NOTE  Referring Provider is Bensimhon, Bevelyn Buckles, MD PCP is [name], [name], MD   HPI:  Patient returns for routine followup more than 1 year s/p minimally invasive mitral and tricuspid valve repair on 04/29/2013 for dilated non-ischemic cardiomyopathy with severe mitral and tricuspid regurgitation. His postoperative recovery was uncomplicated and he was last seen here in our office 07/20/2013.  He continues to follow-up regularly in the advanced heart failure clinic where he was seen by Dr. Shirlee Latch last month. Follow-up echocardiogram performed 07/09/2014 demonstrates intact mitral and tricuspid valve repairs with no mitral regurgitation, no tricuspid regurgitation, and improved left ventricular systolic function with ejection fraction estimated 60-65%. The patient returns to the office today for routine follow-up. He reports that he is doing exceptionally well. He feels much improved in comparison with how he felt before having surgery. He no longer has any exertional shortness of breath or chest discomfort. He has no physical limitations. He is working full-time as a Electrical engineer and enjoying excellent quality of life.   Current Outpatient Prescriptions  Medication Sig Dispense Refill  . aspirin EC 81 MG tablet Take 1 tablet (81 mg total) by mouth daily.    . carvedilol (COREG) 12.5 MG tablet TAKE 1 TABLET BY MOUTH TWICE DAILY WITH A MEAL 60 tablet 5  . losartan (COZAAR) 50 MG tablet Take 1 tablet (50 mg total) by mouth daily. 90 tablet 3  . Multiple Vitamin (MULTIVITAMIN WITH MINERALS) TABS Take 1 tablet by mouth every other day.     . niacin (NIASPAN) 500 MG CR tablet TAKE 1 TABLET BY MOUTH EVERY DAY 90 tablet 0  . simvastatin (ZOCOR) 80 MG tablet Take 1 tablet (80 mg total) by mouth at bedtime. 90 tablet 1   No current facility-administered medications for this visit.        Physical Exam:   BP 126/75 mmHg  Pulse 70  Resp 20  Ht 5' 10.5" (1.791 m)  Wt 225 lb (102.059 kg)  BMI 31.82 kg/m2  SpO2 98%  General:  Well-appearing  Chest:   Clear  CV:   Regular rate and rhythm without murmur  Incisions:  Completely healed  Abdomen:  Soft and nontender  Extremities:  Warm and well-perfused  Diagnostic Tests:  Transthoracic Echocardiography  Patient:  Russell Martin, Kraushaar MR #:    [id number] Study Date: 07/09/2014 Gender:   M Age:    63 Height:   177.8 cm Weight:   99.8 kg BSA:    2.25 m^2 Pt. Status: Room:  SONOGRAPHER Silvano Bilis, RCS ORDERING   Marca Ancona, M.D. REFERRING  Marca Ancona, M.D. ATTENDING  [name], [name] [id number] REFERRING  [name], [name] [id number] PERFORMING  [facility], Outpatient  cc:  ------------------------------------------------------------------- LV EF: 60% -  65%  ------------------------------------------------------------------- Indications:   S/p Mitral Valve and Tricuspid Valve repair (Z98.89).  ------------------------------------------------------------------- History:  Risk factors: Former tobacco use. Hypertension.  ------------------------------------------------------------------- Study Conclusions  - Left ventricle: The cavity size was normal. Wall thickness was normal. Systolic function was normal. The estimated ejection fraction was in the range of 60% to 65%. Wall motion was normal; there were no regional wall motion abnormalities. Doppler parameters are consistent with high ventricular filling pressure. - Aortic valve: Transvalvular velocity was minimally increased. There was no stenosis. There was trivial regurgitation.  Mean gradient (S): 11 mm Hg. - Mitral valve: Previous mitral annuloplasty and repair is intact. The findings are consistent with mild stenosis. Mean gradient (D): 8 mm Hg. - Left atrium: The atrium was moderately dilated. -  Right ventricle: The cavity size was mildly dilated. Wall thickness was normal. - Tricuspid valve: Tricuspid valve repair is intact. There was no regurgitation.  Impressions:  - When compared to prior, EF has improved.  ------------------------------------------------------------------- Labs, prior tests, procedures, and surgery: Permanent pacemaker system implantation.  Transthoracic echocardiography. M-mode, complete 2D, spectral Doppler, and color Doppler. Birthdate: Patient birthdate: 1950/12/02. Age: Patient is 63 yr old. Sex: Gender: male. BMI: 31.6 kg/m^2. Blood pressure:   157/83 Patient status: Outpatient. Study date: Study date: 07/09/2014. Study time: 03:11 PM. Location: Echo laboratory.  -------------------------------------------------------------------  ------------------------------------------------------------------- Left ventricle: The cavity size was normal. Wall thickness was normal. Systolic function was normal. The estimated ejection fraction was in the range of 60% to 65%. Wall motion was normal; there were no regional wall motion abnormalities. Global lateral strain: -20% Doppler parameters are consistent with high ventricular filling pressure.  ------------------------------------------------------------------- Aortic valve:  Trileaflet; mildly thickened, mildly calcified leaflets. Doppler: Transvalvular velocity was minimally increased. There was no stenosis. There was trivial regurgitation. VTI ratio of LVOT to aortic valve: 0.56. Valve area (VTI): 1.77 cm^2. Indexed valve area (VTI): 0.79 cm^2/m^2. Indexed valve area (Vmax): 0.76 cm^2/m^2. Mean velocity ratio of LVOT to aortic valve: 0.58. Valve area (Vmean): 1.83 cm^2. Indexed valve area (Vmean): 0.82 cm^2/m^2.  Mean gradient (S): 11 mm Hg. Peak gradient (S): 23 mm Hg.  ------------------------------------------------------------------- Aorta: Aortic root: The aortic  root was normal in size.  ------------------------------------------------------------------- Mitral valve: Previous mitral annuloplasty and repair is intact. Doppler:  The findings are consistent with mild stenosis.  There was no regurgitation.  Valve area by continuity equation (using LVOT flow): 0.93 cm^2. Indexed valve area by continuity equation (using LVOT flow): 0.41 cm^2/m^2.  Mean gradient (D): 8 mm Hg. Peak gradient (D): 14 mm Hg.  ------------------------------------------------------------------- Left atrium: The atrium was moderately dilated.  ------------------------------------------------------------------- Right ventricle: The cavity size was mildly dilated. Wall thickness was normal. Pacer wire or catheter noted in right ventricle. Systolic function was normal.  ------------------------------------------------------------------- Pulmonic valve:  Poorly visualized. Structurally normal valve. Cusp separation was normal. Doppler: Transvalvular velocity was within the normal range. There was no evidence for stenosis. There was no regurgitation.  ------------------------------------------------------------------- Tricuspid valve: Tricuspid valve repair is intact. Doppler: Transvalvular velocity was within the normal range. There was no regurgitation.  ------------------------------------------------------------------- Pulmonary artery:  The main pulmonary artery was normal-sized. Systolic pressure was within the normal range.  ------------------------------------------------------------------- Right atrium: The atrium was normal in size.  ------------------------------------------------------------------- Pericardium: There was no pericardial effusion.  ------------------------------------------------------------------- Systemic veins: Inferior vena cava: The vessel was normal in  size.  ------------------------------------------------------------------- Measurements  Left ventricle              Value     Reference LV ID, ED, PLAX chordal          46.4 mm    43 - 52 LV ID, ES, PLAX chordal          29.9 mm    23 - 38 LV fx shortening, PLAX chordal      36  %    >=29 LV PW thickness, ED            11.6 mm    --------- IVS/LV PW ratio, ED  0.91      <=1.3 Stroke volume, 2D             74  ml    --------- Stroke volume/bsa, 2D           33  ml/m^2  --------- LV e&', lateral              6.64 cm/s   --------- LV E/e&', lateral             28.46     --------- LV e&', medial               6.85 cm/s   --------- LV E/e&', medial              27.59     --------- LV e&', average              6.75 cm/s   --------- LV E/e&', average             28.02     ---------  Ventricular septum            Value     Reference IVS thickness, ED             10.5 mm    ---------  LVOT                   Value     Reference LVOT ID, S                20  mm    --------- LVOT area                 3.14 cm^2   --------- LVOT peak velocity, S           132  cm/s   --------- LVOT mean velocity, S           89.9 cm/s   --------- LVOT VTI, S                23.5 cm    --------- LVOT peak gradient, S           7   mm Hg  ---------  Aortic valve               Value     Reference Aortic valve mean velocity, S       154  cm/s   --------- Aortic valve VTI, S            41.8 cm    --------- Aortic mean gradient, S          11  mm Hg   --------- Aortic peak gradient, S          23  mm Hg  --------- VTI ratio, LVOT/AV            0.56      --------- Aortic valve area, VTI          1.77 cm^2   --------- Aortic valve area/bsa, VTI        0.79 cm^2/m^2 --------- Aortic valve area/bsa, peak        0.76 cm^2/m^2 --------- velocity Velocity ratio, mean, LVOT/AV       0.58      --------- Aortic valve area, mean velocity     1.83 cm^2   --------- Aortic valve area/bsa, mean        0.82 cm^2/m^2 --------- velocity Aortic regurg pressure half-time     414  ms    ---------  Aorta                   Value     Reference Aortic root ID, ED            28  mm    ---------  Left atrium                Value     Reference LA ID, A-P, ES              46  mm    --------- LA ID/bsa, A-P              2.05 cm/m^2  <=2.2 LA volume, ES, 1-p A4C          73  ml    --------- LA volume/bsa, ES, 1-p A4C        32.5 ml/m^2  ---------  Mitral valve               Value     Reference Mitral E-wave peak velocity        189  cm/s   --------- Mitral A-wave peak velocity        184  cm/s   --------- Mitral mean velocity, D          139  cm/s   --------- Mitral deceleration time     (H)   398  ms    150 - 230 Mitral mean gradient, D          8   mm Hg  --------- Mitral peak gradient, D          14  mm Hg  --------- Mitral E/A ratio, peak          1       --------- Mitral valve area, LVOT          0.93 cm^2   --------- continuity Mitral valve area/bsa, LVOT        0.41 cm^2/m^2 --------- continuity Mitral annulus VTI, D           79.7 cm    ---------  Right ventricle               Value     Reference RV s&', lateral, S             11.7 cm/s   ---------  Legend: (L) and (H) mark values outside specified reference range.  ------------------------------------------------------------------- Prepared and Electronically Authenticated by  Donato Schultz, M.D. 2015-10-30T16:27:25   Impression:  Patient is doing exceptionally well more than one year status post minimally invasive mitral valve repair and tricuspid valve repair for severe mitral and tricuspid regurgitation in the setting of nonischemic cardiomyopathy. He reports stable class I symptoms and late follow-up echocardiogram looks remarkably good with normal left ventricular function and intact valve repairs.    Plan:  In the future the patient will call and return to see [place] as needed. All of his questions have been addressed.  I spent in excess of 15 minutes during the conduct of this office consultation and >50% of this time involved direct face-to-face encounter with the patient for counseling and/or coordination of their care.   Salvatore Decent. Cornelius Moras, MD 07/26/2014 12:12 PM

## 2014-07-26 NOTE — Patient Instructions (Signed)

## 2014-08-19 ENCOUNTER — Encounter (HOSPITAL_COMMUNITY): Payer: Self-pay | Admitting: Cardiovascular Disease

## 2014-09-01 ENCOUNTER — Encounter: Payer: Self-pay | Admitting: *Deleted

## 2014-09-14 ENCOUNTER — Telehealth: Payer: Self-pay | Admitting: Cardiology

## 2014-09-14 NOTE — Telephone Encounter (Signed)
Attempted to help pt trouble shoot home monitor. After several attempts with no success I instructed pt to call tech services. Pt verbalized understanding.

## 2014-10-27 ENCOUNTER — Other Ambulatory Visit (HOSPITAL_COMMUNITY): Payer: Self-pay | Admitting: Cardiology

## 2014-10-27 MED ORDER — SIMVASTATIN 80 MG PO TABS: 80.0000 mg | ORAL_TABLET | Freq: Every day | ORAL | Status: DC

## 2014-11-09 ENCOUNTER — Other Ambulatory Visit (HOSPITAL_COMMUNITY): Payer: Self-pay | Admitting: Cardiology

## 2014-11-09 MED ORDER — NIACIN ER (ANTIHYPERLIPIDEMIC) 500 MG PO TBCR: 500.0000 mg | EXTENDED_RELEASE_TABLET | Freq: Every day | ORAL | Status: DC

## 2014-11-18 ENCOUNTER — Other Ambulatory Visit (HOSPITAL_COMMUNITY): Payer: Self-pay | Admitting: Cardiology

## 2014-11-18 ENCOUNTER — Other Ambulatory Visit: Payer: Self-pay | Admitting: Internal Medicine

## 2014-11-18 DIAGNOSIS — I5022 Chronic systolic (congestive) heart failure: Secondary | ICD-10-CM

## 2014-11-18 MED ORDER — CARVEDILOL 12.5 MG PO TABS: ORAL_TABLET | ORAL | Status: DC

## 2015-01-07 ENCOUNTER — Ambulatory Visit (INDEPENDENT_AMBULATORY_CARE_PROVIDER_SITE_OTHER): Payer: Federal, State, Local not specified - PPO | Admitting: Internal Medicine

## 2015-01-07 ENCOUNTER — Encounter: Payer: Self-pay | Admitting: Internal Medicine

## 2015-01-07 VITALS — BP 146/76 | HR 66 | Ht 70.0 in | Wt 228.2 lb

## 2015-01-07 DIAGNOSIS — I442 Atrioventricular block, complete: Secondary | ICD-10-CM | POA: Diagnosis not present

## 2015-01-07 DIAGNOSIS — I10 Essential (primary) hypertension: Secondary | ICD-10-CM

## 2015-01-07 DIAGNOSIS — I159 Secondary hypertension, unspecified: Secondary | ICD-10-CM | POA: Diagnosis not present

## 2015-01-07 DIAGNOSIS — I5022 Chronic systolic (congestive) heart failure: Secondary | ICD-10-CM

## 2015-01-07 DIAGNOSIS — Z950 Presence of cardiac pacemaker: Secondary | ICD-10-CM | POA: Diagnosis not present

## 2015-01-07 LAB — MDC_IDC_ENUM_SESS_TYPE_INCLINIC
Battery Remaining Longevity: 80.4 mo
Brady Statistic RA Percent Paced: 25 %
Brady Statistic RV Percent Paced: 99.98 %
Implantable Pulse Generator Model: 3210
Implantable Pulse Generator Serial Number: 2882239
Lead Channel Impedance Value: 412.5 Ohm
Lead Channel Pacing Threshold Amplitude: 0.75 V
Lead Channel Pacing Threshold Amplitude: 0.75 V
Lead Channel Pacing Threshold Amplitude: 0.75 V
Lead Channel Pacing Threshold Pulse Width: 0.4 ms
Lead Channel Pacing Threshold Pulse Width: 0.4 ms
Lead Channel Pacing Threshold Pulse Width: 0.4 ms
Lead Channel Pacing Threshold Pulse Width: 0.5 ms
Lead Channel Sensing Intrinsic Amplitude: 12 mV
Lead Channel Sensing Intrinsic Amplitude: 3.9 mV
Lead Channel Setting Pacing Amplitude: 2 V
Lead Channel Setting Pacing Amplitude: 2 V
Lead Channel Setting Pacing Pulse Width: 0.4 ms
Lead Channel Setting Pacing Pulse Width: 0.5 ms
Lead Channel Setting Sensing Sensitivity: 5 mV
MDC IDC MSMT BATTERY VOLTAGE: 2.93 V
MDC IDC MSMT LEADCHNL LV IMPEDANCE VALUE: 887.5 Ohm
MDC IDC MSMT LEADCHNL LV PACING THRESHOLD AMPLITUDE: 0.625 V
MDC IDC MSMT LEADCHNL RV IMPEDANCE VALUE: 312.5 Ohm
MDC IDC SESS DTM: 20160429152736
MDC IDC SET LEADCHNL RV PACING AMPLITUDE: 2 V

## 2015-01-07 NOTE — Patient Instructions (Signed)
Medication Instructions:  Your physician recommends that you continue on your current medications as directed. Please refer to the Current Medication list given to you today.  Labwork: NONE  Testing/Procedures: NONE  Follow-Up: Your physician wants you to follow-up in: 12 months with Dr. Taylor. You will receive a reminder letter in the mail two months in advance. If you don't receive a letter, please call our office to schedule the follow-up appointment.  Remote monitoring is used to monitor your Pacemaker or ICD from home. This monitoring reduces the number of office visits required to check your device to one time per year. It allows us to keep an eye on the functioning of your device to ensure it is working properly. You are scheduled for a device check from home on 04/11/2015. You may send your transmission at any time that day. If you have a wireless device, the transmission will be sent automatically. After your physician reviews your transmission, you will receive a postcard with your next transmission date.    Any Other Special Instructions Will Be Listed Below (If Applicable).   

## 2015-01-09 NOTE — Assessment & Plan Note (Signed)
His symptoms are class 2. He will continue his current meds. He will maintain a low sodium diet. 

## 2015-01-09 NOTE — Assessment & Plan Note (Signed)
His systolic blood pressure is slightly elevated. He will reduce his sodium intake. He will continue his current meds.

## 2015-01-09 NOTE — Assessment & Plan Note (Signed)
His St. Jude DDD BiV PM is working normally. Will recheck in several months.  

## 2015-01-09 NOTE — Progress Notes (Signed)
HPI [name] returns today for followup. He is a very pleasant 64 year old man with complete heart block, status post permanent pacemaker insertion, who developed worsening left ventricular dysfunction. He had a paced QRS duration of 200 ms, and underwent biventricular upgrade almost 2 years ago. Repeat echo demonstrated improvement in his left ventricular function. His ejection fraction increased from 30% to 40%. The patient continued to have dyspnea and ultimately underwent mitral valve repair. He returns today for followup. He is improved. He has not been in the hospital since his last visit. No Known Allergies   Current Outpatient Prescriptions  Medication Sig Dispense Refill  . aspirin EC 81 MG tablet Take 1 tablet (81 mg total) by mouth daily.    . carvedilol (COREG) 12.5 MG tablet TAKE 1 TABLET BY MOUTH TWICE DAILY WITH A MEAL 60 tablet 5  . losartan (COZAAR) 50 MG tablet TAKE 1 TABLET BY MOUTH EVERY DAY. 90 tablet 1  . Multiple Vitamin (MULTIVITAMIN WITH MINERALS) TABS Take 1 tablet by mouth daily.     . niacin (NIASPAN) 500 MG CR tablet Take 1 tablet (500 mg total) by mouth daily. 90 tablet 3  . simvastatin (ZOCOR) 80 MG tablet Take 1 tablet (80 mg total) by mouth at bedtime. 90 tablet 3   No current facility-administered medications for this visit.     Past Medical History  Diagnosis Date  . CHF (congestive heart failure)     a. NICM EF 35% - no CAD by cath 09/2012. b. s/p upgrade to Bi-V pacemaker 11/2012 (did not get ICD as Dr. Ladona Ridgel expects his EF will improve with bi-V pacing).  . Hyperlipidemia   . Hypertension     pt denies HTN  . CHB (complete heart block)     a. Pacer initially placed ~2002.   . Mitral valve regurgitation   . Pacemaker     st jude    Russell Martin  . S/P mitral valve repair 04/29/2013    28mm Sorin Memo 3D ring annuloplasty via right mini thoracotomy  . S/P tricuspid valve repair 04/29/2013    28 mm Edwards mc3 ring annuloplasty via right mini  thoracotomy    ROS:   All systems reviewed and negative except as noted in the HPI.   Past Surgical History  Procedure Laterality Date  . Pacemaker placement    . Cardiac catheterization      clean  . Multiple extractions with alveoloplasty N/A 03/20/2013    Procedure: Extraction of tooth #'s 1,6,7,8,9,10,16,17,20,21,22,23,24,25,26,27,28, 29 with alveoloplasty;  Surgeon: [name], DDS;  Location[facility] OR;  Service: Oral Surgery;  Laterality: N/A;  . Tee without cardioversion N/A 04/09/2013    Procedure: TRANSESOPHAGEAL ECHOCARDIOGRAM (TEE);  Surgeon: Dolores Patty, MD;  Location: [facility] ENDOSCOPY;  Service: Cardiovascular;  Laterality: N/A;  . Mitral valve repair Right 04/29/2013    Procedure: MINIMALLY INVASIVE MITRAL VALVE REPAIR (MVR);  Surgeon: [name], MD;  Location: [facility] OR;  Service: Open Heart Surgery;  Laterality: Right;  . Minimally invasive tricuspid valve repair Right 04/29/2013    Procedure: MINIMALLY INVASIVE TRICUSPID VALVE REPAIR;  Surgeon: [name], MD;  Location: [facility] OR;  Service: Open Heart Surgery;  Laterality: Right;  . Intraoperative transesophageal echocardiogram N/A 04/29/2013    Procedure: INTRAOPERATIVE TRANSESOPHAGEAL ECHOCARDIOGRAM;  Surgeon: [name], MD;  Location: [facility] OR;  Service: Open Heart Surgery;  Laterality: N/A;  . Left and right heart catheterization with coronary angiogram N/A 09/19/2012    Procedure: LEFT  AND RIGHT HEART CATHETERIZATION WITH CORONARY ANGIOGRAM;  Surgeon: [name], MD;  Location: [facility] CATH LAB;  Service: Cardiovascular;  Laterality: N/A;  . Bi-ventricular pacemaker upgrade N/A 11/14/2012    Procedure: BI-VENTRICULAR PACEMAKER UPGRADE;  Surgeon: [name], MD;  Location: [facility] CATH LAB;  Service: Cardiovascular;  Laterality: N/A;     No family history on file.   History   Social History  . Marital Status: Married    Spouse Name: N/A  . Number of Children: 1  . Years of Education: N/A    Occupational History  .     Social History Main Topics  . Smoking status: Former Games developer  . Smokeless tobacco: Never Used  . Alcohol Use: No  . Drug Use: No  . Sexual Activity: Yes   Other Topics Concern  . Not on file   Social History Narrative   Patient is married. Patient has one grown son that lives in [place].   Patient with a history of smoking but quit approximately 10 years ago. Patient denies use of alcohol or other illicit drugs. Patient has never used smokeless tobacco.     BP 146/76 mmHg  Pulse 66  Ht 5\' 10"  (1.778 m)  Wt 103.511 kg (228 lb 3.2 oz)  BMI 32.74 kg/m2  Physical Exam:  Well appearing middle-aged man, NAD HEENT: Unremarkable Neck:  7 cm JVD, no thyromegally Lungs:  Clear with no wheezes, rales, or rhonchi. Well-healed pacemaker incision. HEART:  Regular rate rhythm, no murmurs, no rubs, no clicks Abd:  soft, positive bowel sounds, no organomegally, no rebound, no guarding Ext:  2 plus pulses, no edema, no cyanosis, no clubbing Skin:  No rashes no nodules Neuro:  CN II through XII intact, motor grossly intact   DEVICE  Normal device function.  See PaceArt for details.   Assess/Plan:

## 2015-01-18 ENCOUNTER — Encounter: Payer: Self-pay | Admitting: Internal Medicine

## 2015-02-23 ENCOUNTER — Encounter (HOSPITAL_COMMUNITY): Payer: Self-pay | Admitting: Dentistry

## 2015-02-23 ENCOUNTER — Encounter (INDEPENDENT_AMBULATORY_CARE_PROVIDER_SITE_OTHER): Payer: Self-pay

## 2015-02-23 ENCOUNTER — Ambulatory Visit (HOSPITAL_COMMUNITY): Payer: Self-pay | Admitting: Dentistry

## 2015-02-23 VITALS — BP 146/77 | HR 60 | Temp 98.1°F

## 2015-02-23 DIAGNOSIS — Z950 Presence of cardiac pacemaker: Secondary | ICD-10-CM | POA: Diagnosis not present

## 2015-02-23 DIAGNOSIS — Z9889 Other specified postprocedural states: Secondary | ICD-10-CM

## 2015-02-23 DIAGNOSIS — Z463 Encounter for fitting and adjustment of dental prosthetic device: Secondary | ICD-10-CM

## 2015-02-23 DIAGNOSIS — K000 Anodontia: Principal | ICD-10-CM

## 2015-02-23 DIAGNOSIS — K08109 Complete loss of teeth, unspecified cause, unspecified class: Secondary | ICD-10-CM

## 2015-02-23 DIAGNOSIS — Z972 Presence of dental prosthetic device (complete) (partial): Secondary | ICD-10-CM

## 2015-02-23 NOTE — Patient Instructions (Signed)
Plan:  1. Patient is to keep dentures out if sore spots arise. 2. Patient to use salt water rinses as needed to aid healing. 3. Patient return to clinic as scheduled. Patient to call if problems arise before then.  Saisha Hogue F. Lanette Ell, DDS 

## 2015-02-23 NOTE — Progress Notes (Signed)
02/23/2015  Patient:            Russell Martin Date of Birth:  [birth year] MRN:                [id number]  BP 146/77 mmHg  Pulse 60  Temp(Src) 98.1 F (36.7 C) (Oral)   Russell Martin is a 64 year old male that presents for periodic oral exam and evaluation of upper and lower complete dentures. Patient was recently evaluated by his cardiologist, Dr. Lewayne Martin, in May 2016 and Dr. Cornelius Martin in August of 2015. Patient denies having any problems with his heart or previous heart valve surgeries.   Medical Hx Update:  Past Medical History  Diagnosis Date  . CHF (congestive heart failure)     a. NICM EF 35% - no CAD by cath 09/2012. b. s/p upgrade to Bi-V pacemaker 11/2012 (did not get ICD as Dr. Ladona Ridgel expects his EF will improve with bi-V pacing).  . Hyperlipidemia   . Hypertension     pt denies HTN  . CHB (complete heart block)     a. Pacer initially placed ~2002.   . Mitral valve regurgitation   . Pacemaker     st jude    Russell Martin  . S/P mitral valve repair 04/29/2013    28mm Sorin Memo 3D ring annuloplasty via right mini thoracotomy  . S/P tricuspid valve repair 04/29/2013    28 mm Edwards mc3 ring annuloplasty via right mini thoracotomy  .  ALLERGIES/ADVERSE DRUG REACTIONS: No Known Allergies  MEDICATIONS: Current Outpatient Prescriptions  Medication Sig Dispense Refill  . aspirin EC 81 MG tablet Take 1 tablet (81 mg total) by mouth daily.    . carvedilol (COREG) 12.5 MG tablet TAKE 1 TABLET BY MOUTH TWICE DAILY WITH A MEAL 60 tablet 5  . losartan (COZAAR) 50 MG tablet TAKE 1 TABLET BY MOUTH EVERY DAY. 90 tablet 1  . Multiple Vitamin (MULTIVITAMIN WITH MINERALS) TABS Take 1 tablet by mouth daily.     . niacin (NIASPAN) 500 MG CR tablet Take 1 tablet (500 mg total) by mouth daily. 90 tablet 3  . simvastatin (ZOCOR) 80 MG tablet Take 1 tablet (80 mg total) by mouth at bedtime. 90 tablet 3   No current facility-administered medications for this visit.    C/C: Patient presents  for denture recall. Patient denies having any dental problems.  HPI:  Russell Martin was seen and 03/17/2013 as part of a pre-heart valve surgery dental protocol. Patient had remaining teeth extracted with alveoloplasty and pre-prosthetic surgery as indicated in the operating room on 03/20/2013. After adequate healing, the dentures were fabricated and inserted in on 06/18/2013. Patient has had multiple denture adjustment appointments since that time. The patient now presents for periodic oral examination and evaluation of the upper and lower complete dentures.  DENTAL EXAM: General: Patient is a tall, well-developed, well-nourished male in no acute distress. Vitals: BP 146/77 mmHg  Pulse 60  Temp(Src) 98.1 F (36.7 C) (Oral) Extraoral Exam: There is no palpable lymphadenopathy. The patient denies acute TMJ symptoms. Intraoral  Exam: The patient has normal saliva. There is no evidence of denture irritation erythema or other soft tissue lesions. Dentition: The patient is edentulous. Prosthodontic: The patient has an upper and lower complete dentures. These are stable and retentive. Pressure indicating paste was applied to dentures and they were adjusted as needed minimally. The dentures were polished. Occlusion:  The occlusion of the upper and lower complete dentures is  acceptable.No adjustments were needed.  Assessments: 1. The patient is edentulous. 2. The patient has acceptable upper and lower complete dentures.   Plan:  1. Patient is to keep dentures out if sore spots arise. 2. Patient to use salt water rinses as needed to aid healing. 3. Patient return to clinic as scheduled. Patient to call if problems arise before then.  [name], DDS

## 2015-02-25 ENCOUNTER — Ambulatory Visit (HOSPITAL_COMMUNITY): Payer: Self-pay | Admitting: Dentistry

## 2015-03-22 ENCOUNTER — Encounter: Payer: Self-pay | Admitting: *Deleted

## 2015-03-24 ENCOUNTER — Encounter: Payer: Self-pay | Admitting: Cardiovascular Disease

## 2015-03-25 ENCOUNTER — Encounter: Payer: Self-pay | Admitting: *Deleted

## 2015-04-11 ENCOUNTER — Telehealth: Payer: Self-pay | Admitting: Internal Medicine

## 2015-04-11 ENCOUNTER — Ambulatory Visit (INDEPENDENT_AMBULATORY_CARE_PROVIDER_SITE_OTHER): Payer: Federal, State, Local not specified - PPO | Admitting: *Deleted

## 2015-04-11 DIAGNOSIS — I442 Atrioventricular block, complete: Secondary | ICD-10-CM

## 2015-04-11 NOTE — Telephone Encounter (Signed)
LMTCB//sss 

## 2015-04-11 NOTE — Telephone Encounter (Signed)
New Message       Pt calling wanting to know how to send his signal from Friday, states he has never done it before and no one has told him what he is suppose to do. Please call back and advise.

## 2015-04-12 NOTE — Telephone Encounter (Signed)
Spoke w/ pt and he is aware that he can send his remote transmission anytime on Friday before 5PM. Pt verbalized understanding.

## 2015-04-13 NOTE — Progress Notes (Signed)
Remote pacemaker transmission.   

## 2015-04-15 ENCOUNTER — Telehealth: Payer: Self-pay | Admitting: Cardiology

## 2015-04-15 ENCOUNTER — Telehealth: Payer: Self-pay | Admitting: Internal Medicine

## 2015-04-15 ENCOUNTER — Ambulatory Visit (INDEPENDENT_AMBULATORY_CARE_PROVIDER_SITE_OTHER): Payer: Federal, State, Local not specified - PPO | Admitting: *Deleted

## 2015-04-15 DIAGNOSIS — I442 Atrioventricular block, complete: Secondary | ICD-10-CM | POA: Diagnosis not present

## 2015-04-15 NOTE — Telephone Encounter (Signed)
LMOVM reminding pt to send remote transmission.   

## 2015-04-15 NOTE — Telephone Encounter (Signed)
Informed pt that we received a transmission on 04-12-15 and we will be able to use that for today's transmission. Pt verbalized understanding.

## 2015-04-15 NOTE — Telephone Encounter (Signed)
New Message ° ° °4. Are you calling to see if we received your device transmission? yes ° ° °

## 2015-04-15 NOTE — Progress Notes (Signed)
Remote pacemaker transmission.   

## 2015-04-20 LAB — CUP PACEART REMOTE DEVICE CHECK
Battery Remaining Longevity: 83 mo
Battery Remaining Percentage: 81 %
Date Time Interrogation Session: 20160802043533
Lead Channel Impedance Value: 400 Ohm
Lead Channel Impedance Value: 890 Ohm
Lead Channel Pacing Threshold Amplitude: 0.75 V
Lead Channel Pacing Threshold Amplitude: 0.75 V
Lead Channel Pacing Threshold Pulse Width: 0.4 ms
Lead Channel Pacing Threshold Pulse Width: 0.4 ms
Lead Channel Sensing Intrinsic Amplitude: 12 mV
Lead Channel Setting Pacing Amplitude: 2 V
Lead Channel Setting Pacing Amplitude: 2 V
Lead Channel Setting Pacing Pulse Width: 0.4 ms
Lead Channel Setting Pacing Pulse Width: 0.5 ms
MDC IDC MSMT BATTERY VOLTAGE: 2.93 V
MDC IDC MSMT LEADCHNL LV PACING THRESHOLD PULSEWIDTH: 0.5 ms
MDC IDC MSMT LEADCHNL RA PACING THRESHOLD AMPLITUDE: 0.75 V
MDC IDC MSMT LEADCHNL RA SENSING INTR AMPL: 4.1 mV
MDC IDC MSMT LEADCHNL RV IMPEDANCE VALUE: 310 Ohm
MDC IDC SET LEADCHNL RV PACING AMPLITUDE: 2 V
MDC IDC SET LEADCHNL RV SENSING SENSITIVITY: 5 mV
MDC IDC STAT BRADY AP VP PERCENT: 27 %
MDC IDC STAT BRADY AP VS PERCENT: 1 %
MDC IDC STAT BRADY AS VP PERCENT: 73 %
MDC IDC STAT BRADY AS VS PERCENT: 1 %
MDC IDC STAT BRADY RA PERCENT PACED: 27 %
Pulse Gen Model: 3210
Pulse Gen Serial Number: 2882239

## 2015-04-25 ENCOUNTER — Encounter: Payer: Self-pay | Admitting: Cardiovascular Disease

## 2015-05-10 ENCOUNTER — Other Ambulatory Visit (HOSPITAL_COMMUNITY): Payer: Self-pay | Admitting: Internal Medicine

## 2015-05-11 ENCOUNTER — Encounter: Payer: Self-pay | Admitting: Cardiology

## 2015-05-12 ENCOUNTER — Encounter: Payer: Self-pay | Admitting: Cardiovascular Disease

## 2015-05-17 ENCOUNTER — Encounter: Payer: Self-pay | Admitting: Internal Medicine

## 2015-05-22 ENCOUNTER — Other Ambulatory Visit: Payer: Self-pay | Admitting: Cardiology

## 2015-06-08 ENCOUNTER — Encounter: Payer: Self-pay | Admitting: Cardiovascular Disease

## 2015-06-11 ENCOUNTER — Other Ambulatory Visit (HOSPITAL_COMMUNITY): Payer: Self-pay | Admitting: Internal Medicine

## 2015-07-12 ENCOUNTER — Other Ambulatory Visit (HOSPITAL_COMMUNITY): Payer: Self-pay | Admitting: Internal Medicine

## 2015-07-14 ENCOUNTER — Ambulatory Visit (INDEPENDENT_AMBULATORY_CARE_PROVIDER_SITE_OTHER): Payer: Federal, State, Local not specified - PPO | Admitting: *Deleted

## 2015-07-14 DIAGNOSIS — I442 Atrioventricular block, complete: Secondary | ICD-10-CM | POA: Diagnosis not present

## 2015-07-15 NOTE — Progress Notes (Signed)
Remote pacemaker transmission.   

## 2015-08-03 ENCOUNTER — Encounter: Payer: Self-pay | Admitting: Cardiology

## 2015-08-03 LAB — CUP PACEART REMOTE DEVICE CHECK
Battery Remaining Longevity: 83 mo
Battery Remaining Percentage: 81 %
Brady Statistic AS VP Percent: 68 %
Brady Statistic AS VS Percent: 1 %
Date Time Interrogation Session: 20161103070944
Implantable Lead Implant Date: 20010807
Implantable Lead Implant Date: 20140307
Implantable Lead Location: 753858
Lead Channel Pacing Threshold Amplitude: 0.75 V
Lead Channel Pacing Threshold Amplitude: 0.75 V
Lead Channel Pacing Threshold Pulse Width: 0.4 ms
Lead Channel Pacing Threshold Pulse Width: 0.5 ms
Lead Channel Setting Sensing Sensitivity: 5 mV
MDC IDC LEAD IMPLANT DT: 20010807
MDC IDC LEAD LOCATION: 753859
MDC IDC LEAD LOCATION: 753860
MDC IDC MSMT BATTERY VOLTAGE: 2.93 V
MDC IDC MSMT LEADCHNL LV IMPEDANCE VALUE: 900 Ohm
MDC IDC MSMT LEADCHNL RA IMPEDANCE VALUE: 390 Ohm
MDC IDC MSMT LEADCHNL RA SENSING INTR AMPL: 3.2 mV
MDC IDC MSMT LEADCHNL RV IMPEDANCE VALUE: 310 Ohm
MDC IDC SET LEADCHNL LV PACING AMPLITUDE: 2 V
MDC IDC SET LEADCHNL LV PACING PULSEWIDTH: 0.5 ms
MDC IDC SET LEADCHNL RA PACING AMPLITUDE: 2 V
MDC IDC SET LEADCHNL RV PACING AMPLITUDE: 2 V
MDC IDC SET LEADCHNL RV PACING PULSEWIDTH: 0.4 ms
MDC IDC STAT BRADY AP VP PERCENT: 32 %
MDC IDC STAT BRADY AP VS PERCENT: 1 %
MDC IDC STAT BRADY RA PERCENT PACED: 32 %
Pulse Gen Model: 3210
Pulse Gen Serial Number: 2882239

## 2015-08-10 ENCOUNTER — Other Ambulatory Visit (HOSPITAL_COMMUNITY): Payer: Self-pay | Admitting: Internal Medicine

## 2015-08-23 ENCOUNTER — Other Ambulatory Visit: Payer: Self-pay | Admitting: Cardiology

## 2015-09-12 ENCOUNTER — Other Ambulatory Visit (HOSPITAL_COMMUNITY): Payer: Self-pay | Admitting: Internal Medicine

## 2015-10-06 ENCOUNTER — Other Ambulatory Visit (HOSPITAL_COMMUNITY): Payer: Self-pay | Admitting: Internal Medicine

## 2015-10-07 ENCOUNTER — Telehealth (HOSPITAL_COMMUNITY): Payer: Self-pay | Admitting: Cardiology

## 2015-10-07 MED ORDER — SIMVASTATIN 80 MG PO TABS: 80.0000 mg | ORAL_TABLET | Freq: Every day | ORAL | Status: DC

## 2015-10-07 NOTE — Telephone Encounter (Signed)
Patient called to request refills.

## 2015-10-13 ENCOUNTER — Ambulatory Visit (INDEPENDENT_AMBULATORY_CARE_PROVIDER_SITE_OTHER): Payer: Federal, State, Local not specified - PPO | Admitting: *Deleted

## 2015-10-13 DIAGNOSIS — I442 Atrioventricular block, complete: Secondary | ICD-10-CM | POA: Diagnosis not present

## 2015-10-13 NOTE — Progress Notes (Signed)
Remote pacemaker transmission.   

## 2015-10-31 LAB — CUP PACEART REMOTE DEVICE CHECK
Battery Remaining Longevity: 83 mo
Battery Remaining Percentage: 81 %
Battery Voltage: 2.93 V
Brady Statistic AP VP Percent: 29 %
Brady Statistic AP VS Percent: 1 %
Brady Statistic RA Percent Paced: 28 %
Implantable Lead Implant Date: 20010807
Implantable Lead Location: 753858
Implantable Lead Location: 753859
Lead Channel Impedance Value: 390 Ohm
Lead Channel Pacing Threshold Amplitude: 0.75 V
Lead Channel Pacing Threshold Pulse Width: 0.4 ms
Lead Channel Sensing Intrinsic Amplitude: 12 mV
Lead Channel Sensing Intrinsic Amplitude: 3.9 mV
Lead Channel Setting Pacing Amplitude: 2 V
Lead Channel Setting Pacing Amplitude: 2 V
Lead Channel Setting Pacing Amplitude: 2 V
Lead Channel Setting Pacing Pulse Width: 0.4 ms
Lead Channel Setting Pacing Pulse Width: 0.5 ms
MDC IDC LEAD IMPLANT DT: 20010807
MDC IDC LEAD IMPLANT DT: 20140307
MDC IDC LEAD LOCATION: 753860
MDC IDC MSMT LEADCHNL LV IMPEDANCE VALUE: 890 Ohm
MDC IDC MSMT LEADCHNL LV PACING THRESHOLD AMPLITUDE: 0.75 V
MDC IDC MSMT LEADCHNL LV PACING THRESHOLD PULSEWIDTH: 0.5 ms
MDC IDC MSMT LEADCHNL RV IMPEDANCE VALUE: 290 Ohm
MDC IDC MSMT LEADCHNL RV PACING THRESHOLD AMPLITUDE: 0.75 V
MDC IDC MSMT LEADCHNL RV PACING THRESHOLD PULSEWIDTH: 0.4 ms
MDC IDC PG SERIAL: 2882239
MDC IDC SESS DTM: 20170202074035
MDC IDC SET LEADCHNL RV SENSING SENSITIVITY: 5 mV
MDC IDC STAT BRADY AS VP PERCENT: 71 %
MDC IDC STAT BRADY AS VS PERCENT: 1 %

## 2015-11-04 ENCOUNTER — Encounter: Payer: Self-pay | Admitting: Cardiology

## 2015-11-18 ENCOUNTER — Other Ambulatory Visit: Payer: Self-pay | Admitting: Cardiology

## 2016-01-02 ENCOUNTER — Other Ambulatory Visit (HOSPITAL_COMMUNITY): Payer: Self-pay | Admitting: Internal Medicine

## 2016-01-07 ENCOUNTER — Other Ambulatory Visit (HOSPITAL_COMMUNITY): Payer: Self-pay | Admitting: Internal Medicine

## 2016-01-10 ENCOUNTER — Encounter: Payer: Self-pay | Admitting: Internal Medicine

## 2016-01-10 ENCOUNTER — Ambulatory Visit (INDEPENDENT_AMBULATORY_CARE_PROVIDER_SITE_OTHER): Payer: Federal, State, Local not specified - PPO | Admitting: Internal Medicine

## 2016-01-10 ENCOUNTER — Other Ambulatory Visit (HOSPITAL_COMMUNITY): Payer: Self-pay | Admitting: *Deleted

## 2016-01-10 VITALS — BP 148/86 | HR 63 | Ht 70.5 in | Wt 231.2 lb

## 2016-01-10 DIAGNOSIS — I442 Atrioventricular block, complete: Secondary | ICD-10-CM

## 2016-01-10 LAB — CUP PACEART INCLINIC DEVICE CHECK
Brady Statistic RV Percent Paced: 99.99 %
Implantable Lead Implant Date: 20010807
Implantable Lead Implant Date: 20140307
Implantable Lead Location: 753858
Implantable Lead Location: 753859
Lead Channel Impedance Value: 375 Ohm
Lead Channel Impedance Value: 862.5 Ohm
Lead Channel Pacing Threshold Amplitude: 0.75 V
Lead Channel Pacing Threshold Amplitude: 0.75 V
Lead Channel Pacing Threshold Pulse Width: 0.4 ms
Lead Channel Pacing Threshold Pulse Width: 0.4 ms
Lead Channel Pacing Threshold Pulse Width: 0.4 ms
Lead Channel Pacing Threshold Pulse Width: 0.5 ms
Lead Channel Sensing Intrinsic Amplitude: 4.2 mV
Lead Channel Setting Pacing Pulse Width: 0.4 ms
MDC IDC LEAD IMPLANT DT: 20010807
MDC IDC LEAD LOCATION: 753860
MDC IDC MSMT BATTERY REMAINING LONGEVITY: 73.2
MDC IDC MSMT BATTERY VOLTAGE: 2.92 V
MDC IDC MSMT LEADCHNL LV PACING THRESHOLD AMPLITUDE: 0.75 V
MDC IDC MSMT LEADCHNL LV PACING THRESHOLD PULSEWIDTH: 0.5 ms
MDC IDC MSMT LEADCHNL RA PACING THRESHOLD AMPLITUDE: 0.75 V
MDC IDC MSMT LEADCHNL RV IMPEDANCE VALUE: 287.5 Ohm
MDC IDC MSMT LEADCHNL RV PACING THRESHOLD AMPLITUDE: 0.75 V
MDC IDC MSMT LEADCHNL RV PACING THRESHOLD AMPLITUDE: 0.75 V
MDC IDC MSMT LEADCHNL RV PACING THRESHOLD PULSEWIDTH: 0.4 ms
MDC IDC SESS DTM: 20170502171738
MDC IDC SET LEADCHNL LV PACING AMPLITUDE: 2 V
MDC IDC SET LEADCHNL LV PACING PULSEWIDTH: 0.5 ms
MDC IDC SET LEADCHNL RA PACING AMPLITUDE: 2 V
MDC IDC SET LEADCHNL RV PACING AMPLITUDE: 2 V
MDC IDC SET LEADCHNL RV SENSING SENSITIVITY: 5 mV
MDC IDC STAT BRADY RA PERCENT PACED: 28 %
Pulse Gen Serial Number: 2882239

## 2016-01-10 MED ORDER — NIACIN ER (ANTIHYPERLIPIDEMIC) 500 MG PO TBCR: 500.0000 mg | EXTENDED_RELEASE_TABLET | Freq: Every day | ORAL | Status: DC

## 2016-01-10 NOTE — Patient Instructions (Signed)
Medication Instructions:  Your physician recommends that you continue on your current medications as directed. Please refer to the Current Medication list given to you today.   Labwork: None ordered   Testing/Procedures: None ordered   Follow-Up: Your physician wants you to follow-up in: 12 months with Dr Court Joy will receive a reminder letter in the mail two months in advance. If you don't receive a letter, please call our office to schedule the follow-up appointment.  Remote monitoring is used to monitor your Pacemaker from home. This monitoring reduces the number of office visits required to check your device to one time per year. It allows [place] to keep an eye on the functioning of your device to ensure it is working properly. You are scheduled for a device check from home on 04/10/16. You may send your transmission at any time that day. If you have a wireless device, the transmission will be sent automatically. After your physician reviews your transmission, you will receive a postcard with your next transmission date.     Any Other Special Instructions Will Be Listed Below (If Applicable).     If you need a refill on your cardiac medications before your next appointment, please call your pharmacy.

## 2016-01-10 NOTE — Progress Notes (Signed)
HPI Russell Martin returns today for followup. He is a very pleasant 65 year old man with complete heart block, status post permanent pacemaker insertion, who developed worsening left ventricular dysfunction. He had a paced QRS duration of 200 ms, and underwent biventricular upgrade almost 3 years ago. Repeat echo demonstrated improvement in his left ventricular function. His ejection fraction increased from 30% to 40%. The patient continued to have dyspnea and ultimately underwent mitral valve repair. He returns today for followup. He is improved. He has not been in the hospital since his last visit. He denies palpitations. He is able to exercise without limitation. No Known Allergies   Current Outpatient Prescriptions  Medication Sig Dispense Refill  . aspirin EC 81 MG tablet Take 1 tablet (81 mg total) by mouth daily.    . carvedilol (COREG) 12.5 MG tablet TAKE 1 TABLET BY MOUTH TWICE DAILY WITH A MEAL 60 tablet 0  . losartan (COZAAR) 50 MG tablet TAKE 1 TABLET BY MOUTH DAILY 90 tablet 3  . Multiple Vitamin (MULTIVITAMIN WITH MINERALS) TABS Take 1 tablet by mouth daily.     . niacin (NIASPAN) 500 MG CR tablet Take 1 tablet (500 mg total) by mouth daily. 90 tablet 3  . simvastatin (ZOCOR) 80 MG tablet Take 1 tablet (80 mg total) by mouth at bedtime. 90 tablet 2  . simvastatin (ZOCOR) 80 MG tablet TAKE 1 TABLET BY MOUTH AT BEDTIME 90 tablet 0   No current facility-administered medications for this visit.     Past Medical History  Diagnosis Date  . CHF (congestive heart failure) (HCC)     a. NICM EF 35% - no CAD by cath 09/2012. b. s/p upgrade to Bi-V pacemaker 11/2012 (did not get ICD as Dr. Ladona Ridgel expects his EF will improve with bi-V pacing).  . Hyperlipidemia   . Hypertension     pt denies HTN  . CHB (complete heart block) (HCC)     a. Pacer initially placed ~2002.   . Mitral valve regurgitation   . Pacemaker     st jude    greg Hazelyn Kallen  . S/P mitral valve repair 04/29/2013    28mm Sorin  Memo 3D ring annuloplasty via right mini thoracotomy  . S/P tricuspid valve repair 04/29/2013    28 mm Edwards mc3 ring annuloplasty via right mini thoracotomy  . Dyspnea     2d Echo, EF 40-45%, 5.20.14  . LBBB (left bundle branch block)     Myoview, LV enlarged  . RBBB   . Cardiomyopathy (HCC) 1.10.14    reduced EF 30-35%    ROS:   All systems reviewed and negative except as noted in the HPI.   Past Surgical History  Procedure Laterality Date  . Pacemaker placement    . Cardiac catheterization      clean  . Multiple extractions with alveoloplasty N/A 03/20/2013    Procedure: Extraction of tooth #'s 1,6,7,8,9,10,16,17,20,21,22,23,24,25,26,27,28, 29 with alveoloplasty;  Surgeon: [name], DDS;  Location: [facility] OR;  Service: Oral Surgery;  Laterality: N/A;  . Tee without cardioversion N/A 04/09/2013    Procedure: TRANSESOPHAGEAL ECHOCARDIOGRAM (TEE);  Surgeon: Dolores Patty, MD;  Location: [facility] ENDOSCOPY;  Service: Cardiovascular;  Laterality: N/A;  . Mitral valve repair Right 04/29/2013    Procedure: MINIMALLY INVASIVE MITRAL VALVE REPAIR (MVR);  Surgeon: [name], MD;  Location: [facility] OR;  Service: Open Heart Surgery;  Laterality: Right;  . Minimally invasive tricuspid valve repair Right 04/29/2013    Procedure: MINIMALLY INVASIVE TRICUSPID  VALVE REPAIR;  Surgeon: [name], MD;  Location: [facility] OR;  Service: Open Heart Surgery;  Laterality: Right;  . Intraoperative transesophageal echocardiogram N/A 04/29/2013    Procedure: INTRAOPERATIVE TRANSESOPHAGEAL ECHOCARDIOGRAM;  Surgeon: [name], MD;  Location: [facility] OR;  Service: Open Heart Surgery;  Laterality: N/A;  . Left and right heart catheterization with coronary angiogram N/A 09/19/2012    Procedure: LEFT AND RIGHT HEART CATHETERIZATION WITH CORONARY ANGIOGRAM;  Surgeon: [name], MD;  Location: [facility] CATH LAB;  Service: Cardiovascular;  Laterality: N/A;  . Bi-ventricular pacemaker upgrade N/A 11/14/2012     Procedure: BI-VENTRICULAR PACEMAKER UPGRADE;  Surgeon: [name], MD;  Location: [facility] CATH LAB;  Service: Cardiovascular;  Laterality: N/A;     No family history on file.   Social History   Social History  . Marital Status: Married    Spouse Name: N/A  . Number of Children: 1  . Years of Education: N/A   Occupational History  .     Social History Main Topics  . Smoking status: Former Games developer  . Smokeless tobacco: Never Used  . Alcohol Use: No  . Drug Use: No  . Sexual Activity: Yes   Other Topics Concern  . Not on file   Social History Narrative   Patient is married. Patient has one grown son that lives in [place].   Patient with a history of smoking but quit approximately 10 years ago. Patient denies use of alcohol or other illicit drugs. Patient has never used smokeless tobacco.     BP 148/86 mmHg  Pulse 63  Ht 5' 10.5" (1.791 m)  Wt 231 lb 3.2 oz (104.872 kg)  BMI 32.69 kg/m2  Physical Exam:  Well appearing middle-aged man, NAD HEENT: Unremarkable Neck:  7 cm JVD, no thyromegally Lungs:  Clear with no wheezes, rales, or rhonchi. Well-healed pacemaker incision. HEART:  Regular rate rhythm, no murmurs, no rubs, no clicks Abd:  soft, positive bowel sounds, no organomegally, no rebound, no guarding Ext:  2 plus pulses, no edema, no cyanosis, no clubbing Skin:  No rashes no nodules Neuro:  CN II through XII intact, motor grossly intact  ECG - nsr with Biv Pacing  DEVICE  Normal device function.  See PaceArt for details.   Assess/Plan: 1. CHB - he is doing well, s/p PPM insertion 2. BiV PPM - his St. Jude device is working normally. 3. HTN - his blood pressure is elevated but he states that it is better when he is not in the MD's office. I have asked him to reduce his salt intake and to remain active 4. Chronic systolic heart failure - his symptoms are class 2A. Will follow.  Leonia Reeves.D.

## 2016-02-09 ENCOUNTER — Other Ambulatory Visit (HOSPITAL_COMMUNITY): Payer: Self-pay | Admitting: Internal Medicine

## 2016-02-28 ENCOUNTER — Encounter (HOSPITAL_COMMUNITY): Payer: Self-pay | Admitting: Dentistry

## 2016-03-06 ENCOUNTER — Encounter (INDEPENDENT_AMBULATORY_CARE_PROVIDER_SITE_OTHER): Payer: Self-pay

## 2016-03-06 ENCOUNTER — Ambulatory Visit (HOSPITAL_COMMUNITY): Payer: Self-pay | Admitting: Dentistry

## 2016-03-06 ENCOUNTER — Encounter (HOSPITAL_COMMUNITY): Payer: Self-pay | Admitting: Dentistry

## 2016-03-06 VITALS — BP 142/72 | HR 60 | Temp 98.2°F

## 2016-03-06 DIAGNOSIS — Z463 Encounter for fitting and adjustment of dental prosthetic device: Secondary | ICD-10-CM

## 2016-03-06 DIAGNOSIS — K000 Anodontia: Secondary | ICD-10-CM

## 2016-03-06 DIAGNOSIS — K08109 Complete loss of teeth, unspecified cause, unspecified class: Secondary | ICD-10-CM

## 2016-03-06 DIAGNOSIS — I341 Nonrheumatic mitral (valve) prolapse: Secondary | ICD-10-CM | POA: Diagnosis not present

## 2016-03-06 DIAGNOSIS — Z9889 Other specified postprocedural states: Secondary | ICD-10-CM

## 2016-03-06 DIAGNOSIS — Z972 Presence of dental prosthetic device (complete) (partial): Secondary | ICD-10-CM

## 2016-03-06 NOTE — Progress Notes (Signed)
03/06/2016  Patient:            Russell Martin Date of Birth:  Apr 02, 1951 MRN:                [id number]  BP 142/72 mmHg  Pulse 60  Temp(Src) 98.2 F (36.8 C) (Oral)   [name] is a 65 year old male that presents for periodic oral exam and evaluation of upper and lower complete dentures. Patient was recently evaluated by his cardiologist, Dr. [name], on Jan 10, 2016. Patient denies having any problems with his heart rhythm, pacemaker, or previous heart valve surgeries.  Medical Hx Update:  Past Medical History  Diagnosis Date  . CHF (congestive heart failure) (HCC)     a. NICM EF 35% - no CAD by cath 09/2012. b. s/p upgrade to Bi-V pacemaker 11/2012 (did not get ICD as Dr. Ladona Ridgel expects his EF will improve with bi-V pacing).  . Hyperlipidemia   . Hypertension     pt denies HTN  . CHB (complete heart block) (HCC)     a. Pacer initially placed ~2002.   . Mitral valve regurgitation   . Pacemaker     st jude    greg taylor  . S/P mitral valve repair 04/29/2013    28mm Sorin Memo 3D ring annuloplasty via right mini thoracotomy  . S/P tricuspid valve repair 04/29/2013    28 mm Edwards mc3 ring annuloplasty via right mini thoracotomy  . Dyspnea     2d Echo, EF 40-45%, 5.20.14  . LBBB (left bundle branch block)     Myoview, LV enlarged  . RBBB   . Cardiomyopathy (HCC) 1.10.14    reduced EF 30-35%  .  ALLERGIES/ADVERSE DRUG REACTIONS: No Known Allergies  MEDICATIONS: Current Outpatient Prescriptions  Medication Sig Dispense Refill  . aspirin EC 81 MG tablet Take 1 tablet (81 mg total) by mouth daily.    . carvedilol (COREG) 12.5 MG tablet TAKE 1 TABLET BY MOUTH TWICE DAILY WITH A MEAL 60 tablet 11  . losartan (COZAAR) 50 MG tablet TAKE 1 TABLET BY MOUTH DAILY 90 tablet 3  . Multiple Vitamin (MULTIVITAMIN WITH MINERALS) TABS Take 1 tablet by mouth daily.     . niacin (NIASPAN) 500 MG CR tablet Take 1 tablet (500 mg total) by mouth daily. 90 tablet 3  . simvastatin  (ZOCOR) 80 MG tablet Take 1 tablet (80 mg total) by mouth at bedtime. 90 tablet 2   No current facility-administered medications for this visit.    C/C: Patient presents for denture recall. Patient denies having any dental problems.  HPI:  Russell Martin was seen on 03/17/2013 as part of a pre-heart valve surgery dental protocol. Patient had remaining teeth extracted with alveoloplasty and pre-prosthetic surgery as indicated in the operating room on 03/20/2013. After adequate healing, the dentures were fabricated and inserted in on 06/18/2013. Patient has had multiple denture adjustment appointments since that time. The patient now presents for periodic oral examination and evaluation of the upper and lower complete dentures.  DENTAL EXAM: General: Patient is a tall, well-developed, well-nourished male in no acute distress. Vitals: BP 142/72 mmHg  Pulse 60  Temp(Src) 98.2 F (36.8 C) (Oral) Extraoral Exam: There is no palpable lymphadenopathy. The patient denies acute TMJ symptoms. Intraoral  Exam: The patient has normal saliva. There is no evidence of denture irritation erythema or other soft tissue lesions. Dentition: The patient is edentulous. Prosthodontic: The patient has an upper and lower  complete dentures. These are stable and retentive. Pressure indicating paste was applied to dentures and they were adjusted as needed minimally. The dentures were polished. Calculus on some of the posterior denture teeth was removed and patient was instructed on keeping dentures clean.  Occlusion:  The occlusion of the upper and lower complete dentures is acceptable.No adjustments were needed.  Assessments: 1. The patient is edentulous. 2. The patient has acceptable upper and lower complete dentures.   Plan:  1. Patient is to keep dentures out if sore spots arise. 2. Patient to use salt water rinses as needed to aid healing. 3. Patient return to clinic as scheduled. Patient to call if problems  arise before then.  [name], DDS

## 2016-03-06 NOTE — Patient Instructions (Signed)
Plan:  1. Patient is to keep dentures out if sore spots arise. 2. Patient to use salt water rinses as needed to aid healing. 3. Patient return to clinic as scheduled. Patient to call if problems arise before then.  [name], DDS

## 2016-03-30 ENCOUNTER — Other Ambulatory Visit (HOSPITAL_COMMUNITY): Payer: Self-pay | Admitting: Internal Medicine

## 2016-04-03 ENCOUNTER — Other Ambulatory Visit (HOSPITAL_COMMUNITY): Payer: Self-pay | Admitting: *Deleted

## 2016-04-03 MED ORDER — SIMVASTATIN 80 MG PO TABS: 80.0000 mg | ORAL_TABLET | Freq: Every day | ORAL | 2 refills | Status: DC

## 2016-04-10 ENCOUNTER — Ambulatory Visit (INDEPENDENT_AMBULATORY_CARE_PROVIDER_SITE_OTHER): Payer: Federal, State, Local not specified - PPO | Admitting: *Deleted

## 2016-04-10 DIAGNOSIS — I442 Atrioventricular block, complete: Secondary | ICD-10-CM | POA: Diagnosis not present

## 2016-04-10 NOTE — Progress Notes (Signed)
Remote pacemaker transmission.   

## 2016-04-17 ENCOUNTER — Encounter: Payer: Self-pay | Admitting: Cardiology

## 2016-04-25 LAB — CUP PACEART REMOTE DEVICE CHECK
Battery Remaining Longevity: 73 mo
Battery Remaining Percentage: 73 %
Battery Voltage: 2.92 V
Brady Statistic AS VS Percent: 1 %
Date Time Interrogation Session: 20170801064653
Implantable Lead Location: 753860
Lead Channel Impedance Value: 390 Ohm
Lead Channel Pacing Threshold Amplitude: 0.75 V
Lead Channel Pacing Threshold Pulse Width: 0.4 ms
Lead Channel Pacing Threshold Pulse Width: 0.5 ms
Lead Channel Sensing Intrinsic Amplitude: 3.3 mV
Lead Channel Setting Pacing Amplitude: 2 V
Lead Channel Setting Pacing Pulse Width: 0.4 ms
Lead Channel Setting Pacing Pulse Width: 0.5 ms
MDC IDC LEAD IMPLANT DT: 20010807
MDC IDC LEAD IMPLANT DT: 20010807
MDC IDC LEAD IMPLANT DT: 20140307
MDC IDC LEAD LOCATION: 753858
MDC IDC LEAD LOCATION: 753859
MDC IDC MSMT LEADCHNL LV IMPEDANCE VALUE: 750 Ohm
MDC IDC MSMT LEADCHNL LV PACING THRESHOLD AMPLITUDE: 0.75 V
MDC IDC MSMT LEADCHNL RA PACING THRESHOLD PULSEWIDTH: 0.4 ms
MDC IDC MSMT LEADCHNL RV IMPEDANCE VALUE: 310 Ohm
MDC IDC MSMT LEADCHNL RV PACING THRESHOLD AMPLITUDE: 0.75 V
MDC IDC MSMT LEADCHNL RV SENSING INTR AMPL: 12 mV
MDC IDC SET LEADCHNL LV PACING AMPLITUDE: 2 V
MDC IDC SET LEADCHNL RA PACING AMPLITUDE: 2 V
MDC IDC SET LEADCHNL RV SENSING SENSITIVITY: 5 mV
MDC IDC STAT BRADY AP VP PERCENT: 30 %
MDC IDC STAT BRADY AP VS PERCENT: 1 %
MDC IDC STAT BRADY AS VP PERCENT: 70 %
MDC IDC STAT BRADY RA PERCENT PACED: 30 %
Pulse Gen Model: 3210
Pulse Gen Serial Number: 2882239

## 2016-07-10 ENCOUNTER — Ambulatory Visit (INDEPENDENT_AMBULATORY_CARE_PROVIDER_SITE_OTHER): Payer: Federal, State, Local not specified - PPO | Admitting: *Deleted

## 2016-07-10 DIAGNOSIS — I442 Atrioventricular block, complete: Secondary | ICD-10-CM | POA: Diagnosis not present

## 2016-07-11 NOTE — Progress Notes (Signed)
Remote pacemaker transmission.   

## 2016-07-18 ENCOUNTER — Encounter: Payer: Self-pay | Admitting: Cardiology

## 2016-08-09 LAB — CUP PACEART REMOTE DEVICE CHECK
Battery Remaining Longevity: 71 mo
Brady Statistic AP VS Percent: 1 %
Brady Statistic AS VS Percent: 1 %
Date Time Interrogation Session: 20171031071641
Implantable Lead Implant Date: 20010807
Implantable Lead Location: 753858
Implantable Lead Location: 753859
Implantable Lead Location: 753860
Implantable Pulse Generator Implant Date: 20140307
Lead Channel Impedance Value: 360 Ohm
Lead Channel Impedance Value: 730 Ohm
Lead Channel Pacing Threshold Amplitude: 0.75 V
Lead Channel Pacing Threshold Amplitude: 0.75 V
Lead Channel Sensing Intrinsic Amplitude: 12 mV
Lead Channel Setting Pacing Amplitude: 2 V
Lead Channel Setting Pacing Amplitude: 2 V
Lead Channel Setting Pacing Pulse Width: 0.4 ms
MDC IDC LEAD IMPLANT DT: 20010807
MDC IDC LEAD IMPLANT DT: 20140307
MDC IDC MSMT BATTERY REMAINING PERCENTAGE: 73 %
MDC IDC MSMT BATTERY VOLTAGE: 2.92 V
MDC IDC MSMT LEADCHNL LV PACING THRESHOLD PULSEWIDTH: 0.5 ms
MDC IDC MSMT LEADCHNL RA PACING THRESHOLD AMPLITUDE: 0.75 V
MDC IDC MSMT LEADCHNL RA PACING THRESHOLD PULSEWIDTH: 0.4 ms
MDC IDC MSMT LEADCHNL RA SENSING INTR AMPL: 3.6 mV
MDC IDC MSMT LEADCHNL RV IMPEDANCE VALUE: 280 Ohm
MDC IDC MSMT LEADCHNL RV PACING THRESHOLD PULSEWIDTH: 0.4 ms
MDC IDC SET LEADCHNL LV PACING AMPLITUDE: 2 V
MDC IDC SET LEADCHNL LV PACING PULSEWIDTH: 0.5 ms
MDC IDC SET LEADCHNL RV SENSING SENSITIVITY: 5 mV
MDC IDC STAT BRADY AP VP PERCENT: 30 %
MDC IDC STAT BRADY AS VP PERCENT: 70 %
MDC IDC STAT BRADY RA PERCENT PACED: 30 %
Pulse Gen Model: 3210
Pulse Gen Serial Number: 2882239

## 2016-09-24 ENCOUNTER — Other Ambulatory Visit (HOSPITAL_COMMUNITY): Payer: Self-pay | Admitting: Internal Medicine

## 2016-10-09 ENCOUNTER — Ambulatory Visit (INDEPENDENT_AMBULATORY_CARE_PROVIDER_SITE_OTHER): Payer: Federal, State, Local not specified - PPO | Admitting: *Deleted

## 2016-10-09 DIAGNOSIS — I442 Atrioventricular block, complete: Secondary | ICD-10-CM

## 2016-10-09 NOTE — Progress Notes (Signed)
Remote pacemaker transmission.   

## 2016-10-10 ENCOUNTER — Encounter: Payer: Self-pay | Admitting: Cardiology

## 2016-10-18 LAB — CUP PACEART REMOTE DEVICE CHECK
Battery Remaining Longevity: 72 mo
Brady Statistic AP VS Percent: 1 %
Brady Statistic AS VP Percent: 71 %
Brady Statistic RA Percent Paced: 29 %
Date Time Interrogation Session: 20180130075240
Implantable Lead Implant Date: 20010807
Implantable Lead Location: 753858
Implantable Lead Location: 753859
Implantable Pulse Generator Implant Date: 20140307
Lead Channel Impedance Value: 290 Ohm
Lead Channel Impedance Value: 710 Ohm
Lead Channel Pacing Threshold Pulse Width: 0.4 ms
Lead Channel Sensing Intrinsic Amplitude: 12 mV
Lead Channel Setting Pacing Amplitude: 2 V
MDC IDC LEAD IMPLANT DT: 20010807
MDC IDC LEAD IMPLANT DT: 20140307
MDC IDC LEAD LOCATION: 753860
MDC IDC MSMT BATTERY REMAINING PERCENTAGE: 73 %
MDC IDC MSMT BATTERY VOLTAGE: 2.92 V
MDC IDC MSMT LEADCHNL LV PACING THRESHOLD AMPLITUDE: 0.875 V
MDC IDC MSMT LEADCHNL LV PACING THRESHOLD PULSEWIDTH: 0.5 ms
MDC IDC MSMT LEADCHNL RA IMPEDANCE VALUE: 350 Ohm
MDC IDC MSMT LEADCHNL RA PACING THRESHOLD AMPLITUDE: 0.75 V
MDC IDC MSMT LEADCHNL RA SENSING INTR AMPL: 3.6 mV
MDC IDC MSMT LEADCHNL RV PACING THRESHOLD AMPLITUDE: 0.875 V
MDC IDC MSMT LEADCHNL RV PACING THRESHOLD PULSEWIDTH: 0.4 ms
MDC IDC SET LEADCHNL LV PACING PULSEWIDTH: 0.5 ms
MDC IDC SET LEADCHNL RA PACING AMPLITUDE: 2 V
MDC IDC SET LEADCHNL RV PACING AMPLITUDE: 2 V
MDC IDC SET LEADCHNL RV PACING PULSEWIDTH: 0.4 ms
MDC IDC SET LEADCHNL RV SENSING SENSITIVITY: 5 mV
MDC IDC STAT BRADY AP VP PERCENT: 29 %
MDC IDC STAT BRADY AS VS PERCENT: 1 %
Pulse Gen Model: 3210
Pulse Gen Serial Number: 2882239

## 2016-11-14 ENCOUNTER — Other Ambulatory Visit: Payer: Self-pay | Admitting: Cardiology

## 2016-11-20 ENCOUNTER — Other Ambulatory Visit (HOSPITAL_COMMUNITY): Payer: Self-pay | Admitting: Cardiology

## 2016-11-20 MED ORDER — LOSARTAN POTASSIUM 50 MG PO TABS: 50.0000 mg | ORAL_TABLET | Freq: Every day | ORAL | 3 refills | Status: DC

## 2016-11-28 ENCOUNTER — Encounter (HOSPITAL_COMMUNITY): Payer: Self-pay

## 2016-12-10 ENCOUNTER — Ambulatory Visit (HOSPITAL_COMMUNITY)
Admission: RE | Admit: 2016-12-10 | Discharge: 2016-12-10 | Disposition: A | Discharge status: Home / Self Care | Payer: Federal, State, Local not specified - PPO | Source: Ambulatory Visit | Attending: Internal Medicine | Admitting: Internal Medicine

## 2016-12-10 ENCOUNTER — Encounter (HOSPITAL_COMMUNITY): Payer: Self-pay

## 2016-12-10 VITALS — BP 140/76 | HR 60 | Wt 232.8 lb

## 2016-12-10 DIAGNOSIS — I110 Hypertensive heart disease with heart failure: Secondary | ICD-10-CM | POA: Insufficient documentation

## 2016-12-10 DIAGNOSIS — I420 Dilated cardiomyopathy: Secondary | ICD-10-CM | POA: Diagnosis not present

## 2016-12-10 DIAGNOSIS — I10 Essential (primary) hypertension: Secondary | ICD-10-CM | POA: Diagnosis not present

## 2016-12-10 DIAGNOSIS — I442 Atrioventricular block, complete: Secondary | ICD-10-CM

## 2016-12-10 DIAGNOSIS — I5022 Chronic systolic (congestive) heart failure: Secondary | ICD-10-CM | POA: Diagnosis not present

## 2016-12-10 DIAGNOSIS — Z950 Presence of cardiac pacemaker: Secondary | ICD-10-CM | POA: Diagnosis not present

## 2016-12-10 DIAGNOSIS — Z9889 Other specified postprocedural states: Secondary | ICD-10-CM | POA: Diagnosis not present

## 2016-12-10 DIAGNOSIS — E785 Hyperlipidemia, unspecified: Secondary | ICD-10-CM | POA: Diagnosis not present

## 2016-12-10 DIAGNOSIS — Z7982 Long term (current) use of aspirin: Secondary | ICD-10-CM | POA: Insufficient documentation

## 2016-12-10 DIAGNOSIS — M109 Gout, unspecified: Secondary | ICD-10-CM | POA: Diagnosis not present

## 2016-12-10 LAB — BASIC METABOLIC PANEL
ANION GAP: 10 (ref 5–15)
BUN: 20 mg/dL (ref 6–20)
CHLORIDE: 107 mmol/L (ref 101–111)
CO2: 24 mmol/L (ref 22–32)
Calcium: 9.4 mg/dL (ref 8.9–10.3)
Creatinine, Ser: 1.52 mg/dL — ABNORMAL HIGH (ref 0.61–1.24)
GFR, EST AFRICAN AMERICAN: 55 mL/min — AB (ref >60)
GFR, EST NON AFRICAN AMERICAN: 47 mL/min — AB (ref >60)
Glucose, Bld: 82 mg/dL (ref 65–99)
POTASSIUM: 4.4 mmol/L (ref 3.5–5.1)
Sodium: 141 mmol/L (ref 135–145)

## 2016-12-10 LAB — CBC
HEMATOCRIT: 45.6 % (ref 39.0–52.0)
HEMOGLOBIN: 15 g/dL (ref 13.0–17.0)
MCH: 27.8 pg (ref 26.0–34.0)
MCHC: 32.9 g/dL (ref 30.0–36.0)
MCV: 84.6 fL (ref 78.0–100.0)
Platelets: 177 10*3/uL (ref 150–400)
RBC: 5.39 MIL/uL (ref 4.22–5.81)
RDW: 13.9 % (ref 11.5–15.5)
WBC: 6.2 10*3/uL (ref 4.0–10.5)

## 2016-12-10 MED ORDER — ASPIRIN EC 81 MG PO TBEC: 81.0000 mg | DELAYED_RELEASE_TABLET | Freq: Every day | ORAL | Status: AC

## 2016-12-10 MED ORDER — SIMVASTATIN 80 MG PO TABS: 80.0000 mg | ORAL_TABLET | Freq: Every day | ORAL | 3 refills | Status: DC

## 2016-12-10 MED ORDER — CARVEDILOL 12.5 MG PO TABS: ORAL_TABLET | ORAL | 3 refills | Status: DC

## 2016-12-10 MED ORDER — LOSARTAN POTASSIUM 50 MG PO TABS: 50.0000 mg | ORAL_TABLET | Freq: Every day | ORAL | 3 refills | Status: DC

## 2016-12-10 NOTE — Patient Instructions (Signed)
Routine lab work today. Will notify you of abnormal results, otherwise no news is good news!  Refills sent on all cardiac medications.  Will schedule you for an echocardiogram at [facility]. Address: [address] ([address] Floor), [place], [place] [address]  Phone: [phone]  ______________________________________________________  ______________________________________________________  Follow up 6 months with Dr. Shirlee Latch.  Do the following things EVERYDAY: 1) Weigh yourself in the morning before breakfast. Write it down and keep it in a log. 2) Take your medicines as prescribed 3) Eat low salt foods-Limit salt (sodium) to 2000 mg per day.  4) Stay as active as you can everyday 5) Limit all fluids for the day to less than 2 liters

## 2016-12-10 NOTE — Progress Notes (Signed)
Patient ID: Russell Martin, male   DOB: 1951-08-07, 66 y.o.   MRN: [id number]    Advanced Heart Failure Clinic Note   EP: Dr Ladona Ridgel  PCP: Dr Jillyn Hidden  Cardiologist: Dr. Gala Romney   HPI:   Russell Martin is a 66 y.o. male  with PMH of systolic HF due to NICM (EF 35%) s/p St Jude CRT-P, complete heart block 2001 S/P permanent pacemaker, gout, HTN, MR, TR, and hyperlipidemia. He was previously followed by Dr. Alanda Amass.  09/2012 LHC/RHC No CAD.  RHC RA: 11  RV: 58-60/10  PA: 60/28; mean 44  PCWP: 17; v wave 25  LV: 116/12  CO: 5.4 (Thermo); 5.3 l/m  CI: 2.6 2.5 l/m/m2   01/27/13 ECHO EF 45-45% Severe MR Mod-Severe TR  04/09/13 TEE EF 40% Mod TR Severe central MR  8/14 echo EF 50-55%, s/p TV/MV repair with no TR/MR, aortic valve opens well but LVOT gradient to 25 mmHg peak.   Echo 07/09/14 LVEF 60-65%, Trivial AI, s/p TV/MV repair with no TR/MR  Patient is s/p minimally invasive mitral and tricuspid valve repair on 04/29/2013 for dilated non-ischemic cardiomyopathy with severe mitral and tricuspid regurgitation.  Pt presents today for follow up.  Last seen by Dr. Shirlee Latch in 2015. Has been feeling great overall. Watching salt and fluid. 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 Denies any DOE or CP. Has occasional cough when he has high salt foods, weight may go up a couple of lbs. No orthopnea or PND.   Labs (9/14): K 4.2, creatinine 1.36, digoxin 0.7 Labs (10/15): K 4.8, Creatinine 1.31, digoxin 0.5.  ECG: NSR, BiV paced  SH: Lives with wife in [place].   Review of systems complete and found to be negative unless listed in HPI.    Past Medical History:  Diagnosis Date  . Cardiomyopathy (HCC) 1.10.14   reduced EF 30-35%  . CHB (complete heart block) (HCC)    a. Pacer initially placed ~2002.   [name] CHF (congestive heart failure) (HCC)    a. NICM EF 35% - no CAD by cath 09/2012. b. s/p upgrade to Bi-V pacemaker 11/2012 (did not get ICD as Dr. Ladona Ridgel expects his EF will improve with  bi-V pacing).  [name] Dyspnea    2d Echo, EF 40-45%, 5.20.14  . Hyperlipidemia   . Hypertension    pt denies HTN  . LBBB (left bundle branch block)    Myoview, LV enlarged  . Mitral valve regurgitation   . Pacemaker    st jude    greg taylor  . RBBB   . S/P mitral valve repair 04/29/2013   27mm Sorin Memo 3D ring annuloplasty via right mini thoracotomy  . S/P tricuspid valve repair 04/29/2013   28 mm Edwards mc3 ring annuloplasty via right mini thoracotomy    Current Outpatient Prescriptions  Medication Sig Dispense Refill  . aspirin EC 81 MG tablet Take 1 tablet (81 mg total) by mouth daily.    . carvedilol (COREG) 12.5 MG tablet TAKE 1 TABLET BY MOUTH TWICE DAILY WITH A MEAL 60 tablet 3  . losartan (COZAAR) 50 MG tablet Take 1 tablet (50 mg total) by mouth daily. 90 tablet 3  . Multiple Vitamin (MULTIVITAMIN WITH MINERALS) TABS Take 1 tablet by mouth daily.     . niacin (NIASPAN) 500 MG CR tablet Take 1 tablet (500 mg total) by mouth daily. 90 tablet 3  . simvastatin (ZOCOR) 80 MG tablet TAKE 1 TABLET BY MOUTH AT BEDTIME  90 tablet 3   No current facility-administered medications for this encounter.    PHYSICAL EXAM: Vitals:   12/10/16 1503  BP: 140/76  Pulse: 60  SpO2: 99%  Weight: 232 lb 12.8 oz (105.6 kg)   Wt Readings from Last 3 Encounters:  12/10/16 232 lb 12.8 oz (105.6 kg)  01/10/16 231 lb 3.2 oz (104.9 kg)  01/07/15 228 lb 3.2 oz (103.5 kg)   General:  Elderly. Well appearing. Wife present. NAD  HEENT: Normal.  Neck: Supple. JVP 6-7 cm. Carotids 2+ bilat; no bruits. No thyromegaly or nodule noted.   Cor: PMI non-displaced. RRR. No M/G/R appreciated.  Lungs: Clear, normal effort.  Abdomen: soft,NT, ND, no HSM. No bruits or masses. +BS  Extremities: No cyanosis, clubbing, rash, or edema.  Neuro: Alert & oriented x 3. Cranial nerves grossly intact. Moves all 4 extremities w/o difficulty. Affect pleasant    ASSESSMENT & PLAN:  1) Chronic systolic HF:  NICM - Echo  07/09/14 LVEF 60-65%, Trivial AI, s/p TV/MV repair with no TR/MR. - NYHA I symptoms. Volume status stable.  - Continue coreg 12.5 mg BID for now - Continue losartan 50 mg daily for now. Last K borderline high, so will check prior to making any changes.  - Reinforced fluid restriction to < 2 L daily, sodium restriction to less than 2000 mg daily, and the importance of daily weights.   2) MV/TV repair:  - No appreciable murmur on exam.  - Will arrange for repeat Echocardiogram. Has been several years since viewed.   3) HTN:  - Relatively well controlled. Will repeat labs today and consider medication adjustment from there.   Will send refills of his cardiac meds.  Will arrange for follow up Echo. Check labwork today. RTC 6 months. Sooner with any symptoms.   I have recommended against Mr Schum take any OTC supplements for "exercise" or "weight loss". Discussed that they have limited to no benefit with potential for harm in cardiac patients.   Graciella Freer, PA-C  12/10/2016 3:34 PM

## 2016-12-24 ENCOUNTER — Ambulatory Visit (HOSPITAL_COMMUNITY): Payer: Federal, State, Local not specified - PPO | Attending: Cardiovascular Disease

## 2016-12-24 ENCOUNTER — Other Ambulatory Visit: Payer: Self-pay

## 2016-12-24 DIAGNOSIS — Z950 Presence of cardiac pacemaker: Secondary | ICD-10-CM | POA: Diagnosis not present

## 2016-12-24 DIAGNOSIS — E785 Hyperlipidemia, unspecified: Secondary | ICD-10-CM | POA: Insufficient documentation

## 2016-12-24 DIAGNOSIS — I429 Cardiomyopathy, unspecified: Secondary | ICD-10-CM | POA: Insufficient documentation

## 2016-12-24 DIAGNOSIS — I050 Rheumatic mitral stenosis: Secondary | ICD-10-CM | POA: Diagnosis not present

## 2016-12-24 DIAGNOSIS — I082 Rheumatic disorders of both aortic and tricuspid valves: Secondary | ICD-10-CM | POA: Insufficient documentation

## 2016-12-24 DIAGNOSIS — I5022 Chronic systolic (congestive) heart failure: Secondary | ICD-10-CM | POA: Diagnosis not present

## 2016-12-24 DIAGNOSIS — I110 Hypertensive heart disease with heart failure: Secondary | ICD-10-CM | POA: Diagnosis not present

## 2016-12-25 ENCOUNTER — Encounter: Payer: Self-pay | Admitting: Internal Medicine

## 2016-12-31 ENCOUNTER — Other Ambulatory Visit (HOSPITAL_COMMUNITY): Payer: Self-pay | Admitting: Internal Medicine

## 2017-01-10 ENCOUNTER — Ambulatory Visit: Payer: Federal, State, Local not specified - PPO | Admitting: Internal Medicine

## 2017-01-10 ENCOUNTER — Encounter: Payer: Self-pay | Admitting: Internal Medicine

## 2017-01-10 VITALS — BP 132/86 | HR 60 | Ht 70.0 in | Wt 233.0 lb

## 2017-01-10 DIAGNOSIS — I442 Atrioventricular block, complete: Secondary | ICD-10-CM

## 2017-01-10 NOTE — Patient Instructions (Signed)
Medication Instructions:  Your physician recommends that you continue on your current medications as directed. Please refer to the Current Medication list given to you today.   Labwork: None ordered  Testing/Procedures: None ordered  Follow-Up: Remote monitoring is used to monitor your Pacemaker bfrom home. This monitoring reduces the number of office visits required to check your device to one time per year. It allows [place] to keep an eye on the functioning of your device to ensure it is working properly. You are scheduled for a device check from home on 04/11/17. You may send your transmission at any time that day. If you have a wireless device, the transmission will be sent automatically. After your physician reviews your transmission, you will receive a postcard with your next transmission date.    Your physician wants you to follow-up in: 1 year with Dr. Ladona Ridgel. You will receive a reminder letter in the mail two months in advance. If you don't receive a letter, please call our office to schedule the follow-up appointment.   Any Other Special Instructions Will Be Listed Below (If Applicable).     If you need a refill on your cardiac medications before your next appointment, please call your pharmacy.

## 2017-01-10 NOTE — Progress Notes (Signed)
HPI Russell Martin returns today for followup. He is a very pleasant 66 year old man with complete heart block, status post permanent pacemaker insertion, who developed worsening left ventricular dysfunction. He had a paced QRS duration of 200 ms, and underwent biventricular upgrade almost 4 years ago. Repeat echo demonstrated improvement in his left ventricular function. His ejection fraction increased from 30% to 40%. The patient continued to have dyspnea and ultimately underwent mitral valve repair. He returns today for followup. He is improved. He has not been in the hospital since his last visit. He denies palpitations. He is able to exercise without limitation. He is performing yard work as well. No Known Allergies   Current Outpatient Prescriptions  Medication Sig Dispense Refill  . aspirin EC 81 MG tablet Take 1 tablet (81 mg total) by mouth daily.    . carvedilol (COREG) 12.5 MG tablet TAKE 1 TABLET BY MOUTH TWICE DAILY WITH A MEAL 60 tablet 3  . losartan (COZAAR) 50 MG tablet Take 1 tablet (50 mg total) by mouth daily. 90 tablet 3  . Multiple Vitamin (MULTIVITAMIN WITH MINERALS) TABS Take 1 tablet by mouth daily.     . niacin (NIASPAN) 500 MG CR tablet TAKE 1 TABLET BY MOUTH DAILY 90 tablet 0  . simvastatin (ZOCOR) 80 MG tablet Take 1 tablet (80 mg total) by mouth at bedtime. 90 tablet 3   No current facility-administered medications for this visit.      Past Medical History:  Diagnosis Date  . Cardiomyopathy (HCC) 1.10.14   reduced EF 30-35%  . CHB (complete heart block) (HCC)    a. Pacer initially placed ~2002.   [name] CHF (congestive heart failure) (HCC)    a. NICM EF 35% - no CAD by cath 09/2012. b. s/p upgrade to Bi-V pacemaker 11/2012 (did not get ICD as Dr. Ladona Ridgel expects his EF will improve with bi-V pacing).  [name] Dyspnea    2d Echo, EF 40-45%, 5.20.14  . Hyperlipidemia   . Hypertension    pt denies HTN  . LBBB (left bundle branch block)    Myoview, LV enlarged  . Mitral valve  regurgitation   . Pacemaker    st jude    greg Asmar Brozek  . RBBB   . S/P mitral valve repair 04/29/2013   88mm Sorin Memo 3D ring annuloplasty via right mini thoracotomy  . S/P tricuspid valve repair 04/29/2013   28 mm Edwards mc3 ring annuloplasty via right mini thoracotomy    ROS:   All systems reviewed and negative except as noted in the HPI.   Past Surgical History:  Procedure Laterality Date  . BI-VENTRICULAR PACEMAKER UPGRADE N/A 11/14/2012   Procedure: BI-VENTRICULAR PACEMAKER UPGRADE;  Surgeon: [name], MD;  Location: [facility] CATH LAB;  Service: Cardiovascular;  Laterality: N/A;  . CARDIAC CATHETERIZATION     clean  . INTRAOPERATIVE TRANSESOPHAGEAL ECHOCARDIOGRAM N/A 04/29/2013   Procedure: INTRAOPERATIVE TRANSESOPHAGEAL ECHOCARDIOGRAM;  Surgeon: [name], MD;  Location: [facility] OR;  Service: Open Heart Surgery;  Laterality: N/A;  . LEFT AND RIGHT HEART CATHETERIZATION WITH CORONARY ANGIOGRAM N/A 09/19/2012   Procedure: LEFT AND RIGHT HEART CATHETERIZATION WITH CORONARY ANGIOGRAM;  Surgeon: [name], MD;  Location: [facility] CATH LAB;  Service: Cardiovascular;  Laterality: N/A;  . MINIMALLY INVASIVE TRICUSPID VALVE REPAIR Right 04/29/2013   Procedure: MINIMALLY INVASIVE TRICUSPID VALVE REPAIR;  Surgeon: [name], MD;  Location: [facility] OR;  Service: Open Heart Surgery;  Laterality: Right;  . MITRAL VALVE REPAIR Right 04/29/2013  Procedure: MINIMALLY INVASIVE MITRAL VALVE REPAIR (MVR);  Surgeon: [name], MD;  Location: [facility] OR;  Service: Open Heart Surgery;  Laterality: Right;  . MULTIPLE EXTRACTIONS WITH ALVEOLOPLASTY N/A 03/20/2013   Procedure: Extraction of tooth #'s 1,6,7,8,9,10,16,17,20,21,22,23,24,25,26,27,28, 29 with alveoloplasty;  Surgeon: [name], DDS;  Location: [facility] OR;  Service: Oral Surgery;  Laterality: N/A;  . PACEMAKER PLACEMENT    . TEE WITHOUT CARDIOVERSION N/A 04/09/2013   Procedure: TRANSESOPHAGEAL ECHOCARDIOGRAM (TEE);  Surgeon: Dolores Patty,  MD;  Location: [facility] ENDOSCOPY;  Service: Cardiovascular;  Laterality: N/A;     No family history on file.   Social History   Social History  . Marital status: Married    Spouse name: N/A  . Number of children: 1  . Years of education: N/A   Occupational History  .  [employer]   Social History Main Topics  . Smoking status: Former Games developer  . Smokeless tobacco: Never Used  . Alcohol use No  . Drug use: No  . Sexual activity: Yes   Other Topics Concern  . Not on file   Social History Narrative   Patient is married. Patient has one grown son that lives in [place].   Patient with a history of smoking but quit approximately 10 years ago. Patient denies use of alcohol or other illicit drugs. Patient has never used smokeless tobacco.     BP 132/86   Pulse 60   Ht 5\' 10"  (1.778 m)   Wt 233 lb (105.7 kg)   SpO2 96%   BMI 33.43 kg/m   Physical Exam:  Well appearing middle-aged man, NAD HEENT: Unremarkable Neck:  7 cm JVD, no thyromegally Lungs:  Clear with no wheezes, rales, or rhonchi. Well-healed pacemaker incision. HEART:  Regular rate rhythm, no murmurs, no rubs, no clicks Abd:  soft, positive bowel sounds, no organomegally, no rebound, no guarding Ext:  2 plus pulses, no edema, no cyanosis, no clubbing Skin:  No rashes no nodules Neuro:  CN II through XII intact, motor grossly intact  ECG - nsr with Biv Pacing  DEVICE  Normal device function.  See PaceArt for details.   Assess/Plan: 1. CHB - he is doing well, s/p PPM insertion 2. BiV PPM - his St. Jude device is working normally. 3. HTN - his blood pressure is minimally elevated. I have asked him to reduce his salt intake and to remain active 4. Chronic systolic heart failure - his symptoms are class 2A. Will follow.  Leonia Reeves.D.

## 2017-01-11 LAB — CUP PACEART INCLINIC DEVICE CHECK
Battery Voltage: 2.92 V
Brady Statistic RA Percent Paced: 28 %
Implantable Lead Implant Date: 20010807
Implantable Lead Location: 753858
Implantable Lead Location: 753859
Lead Channel Impedance Value: 287.5 Ohm
Lead Channel Impedance Value: 362.5 Ohm
Lead Channel Pacing Threshold Amplitude: 0.75 V
Lead Channel Pacing Threshold Amplitude: 0.75 V
Lead Channel Pacing Threshold Pulse Width: 0.4 ms
Lead Channel Pacing Threshold Pulse Width: 0.5 ms
Lead Channel Setting Pacing Amplitude: 2 V
Lead Channel Setting Pacing Amplitude: 2 V
Lead Channel Setting Sensing Sensitivity: 5 mV
MDC IDC LEAD IMPLANT DT: 20010807
MDC IDC LEAD IMPLANT DT: 20140307
MDC IDC LEAD LOCATION: 753860
MDC IDC MSMT LEADCHNL LV IMPEDANCE VALUE: 687.5 Ohm
MDC IDC MSMT LEADCHNL RA SENSING INTR AMPL: 3.2 mV
MDC IDC MSMT LEADCHNL RV PACING THRESHOLD AMPLITUDE: 0.75 V
MDC IDC MSMT LEADCHNL RV PACING THRESHOLD PULSEWIDTH: 0.4 ms
MDC IDC MSMT LEADCHNL RV SENSING INTR AMPL: 12 mV
MDC IDC PG IMPLANT DT: 20140307
MDC IDC PG SERIAL: 2882239
MDC IDC SESS DTM: 20180503193047
MDC IDC SET LEADCHNL LV PACING AMPLITUDE: 2 V
MDC IDC SET LEADCHNL LV PACING PULSEWIDTH: 0.5 ms
MDC IDC SET LEADCHNL RV PACING PULSEWIDTH: 0.4 ms
MDC IDC STAT BRADY RV PERCENT PACED: 99.98 %

## 2017-01-26 ENCOUNTER — Other Ambulatory Visit (HOSPITAL_COMMUNITY): Payer: Self-pay | Admitting: Internal Medicine

## 2017-02-24 ENCOUNTER — Other Ambulatory Visit (HOSPITAL_COMMUNITY): Payer: Self-pay | Admitting: Student

## 2017-03-05 ENCOUNTER — Encounter (HOSPITAL_COMMUNITY): Payer: Self-pay | Admitting: Dentistry

## 2017-03-11 ENCOUNTER — Ambulatory Visit (HOSPITAL_COMMUNITY): Payer: Self-pay | Admitting: Dentistry

## 2017-03-11 ENCOUNTER — Encounter (HOSPITAL_COMMUNITY): Payer: Self-pay | Admitting: Dentistry

## 2017-03-11 VITALS — BP 165/87 | HR 60 | Temp 98.8°F

## 2017-03-11 DIAGNOSIS — K000 Anodontia: Secondary | ICD-10-CM

## 2017-03-11 DIAGNOSIS — K08109 Complete loss of teeth, unspecified cause, unspecified class: Secondary | ICD-10-CM

## 2017-03-11 DIAGNOSIS — Z9889 Other specified postprocedural states: Secondary | ICD-10-CM

## 2017-03-11 DIAGNOSIS — Z463 Encounter for fitting and adjustment of dental prosthetic device: Secondary | ICD-10-CM

## 2017-03-11 DIAGNOSIS — Z972 Presence of dental prosthetic device (complete) (partial): Secondary | ICD-10-CM

## 2017-03-11 NOTE — Patient Instructions (Signed)
Plan:  1. Patient is to keep dentures out if sore spots arise. 2. Patient to use salt water rinses as needed to aid healing. 3. Patient return to clinic as scheduled. Patient to call if problems arise before then. 4. Call if patient wishes to proceed with replacement of tooth #9 by a dental lab.  [name], DDS

## 2017-03-11 NOTE — Progress Notes (Signed)
03/11/2017  Patient:            Russell Martin Date of Birth:  07/30/1951 MRN:                [id number]  BP (!) 165/87 (BP Location: Left Arm)   Pulse 60   Temp 98.8 F (37.1 C) (Oral)    Russell Martin is a 66 year old male that presents for periodic oral exam and evaluation of upper and lower complete dentures. Patient was recently evaluated by his cardiologist, Dr. [name], on Jan 10, 2017. Patient denies having any problems with his heart rhythm, pacemaker, or previous heart valve surgeries.  Patient Active Problem List   Diagnosis Date Noted  . S/P mitral valve repair 04/29/2013  . S/P tricuspid valve repair 04/29/2013  . MR (mitral regurgitation) 03/11/2013  . Tricuspid valve regurgitation 03/11/2013  . Mitral regurgitation 03/03/2013  . Chronic systolic heart failure (HCC) 10/21/2012  . Complete heart block (HCC) 10/21/2012  . Essential hypertension 10/21/2012  . Pacemaker 10/21/2012     Medical Hx Update:  Past Medical History:  Diagnosis Date  . Cardiomyopathy (HCC) 1.10.14   reduced EF 30-35%  . CHB (complete heart block) (HCC)    a. Pacer initially placed ~2002.   [name] CHF (congestive heart failure) (HCC)    a. NICM EF 35% - no CAD by cath 09/2012. b. s/p upgrade to Bi-V pacemaker 11/2012 (did not get ICD as Dr. Ladona Ridgel expects his EF will improve with bi-V pacing).  [name] Dyspnea    2d Echo, EF 40-45%, 5.20.14  . Hyperlipidemia   . Hypertension    pt denies HTN  . LBBB (left bundle branch block)    Myoview, LV enlarged  . Mitral valve regurgitation   . Pacemaker    st jude    greg taylor  . RBBB   . S/P mitral valve repair 04/29/2013   28mm Sorin Memo 3D ring annuloplasty via right mini thoracotomy  . S/P tricuspid valve repair 04/29/2013   28 mm Edwards mc3 ring annuloplasty via right mini thoracotomy  .  ALLERGIES/ADVERSE DRUG REACTIONS: No Known Allergies  MEDICATIONS: Current Outpatient Prescriptions  Medication Sig Dispense Refill  . aspirin EC 81 MG  tablet Take 1 tablet (81 mg total) by mouth daily.    . carvedilol (COREG) 12.5 MG tablet TAKE 1 TABLET BY MOUTH TWICE DAILY, WITH A MEAL 60 tablet 6  . losartan (COZAAR) 50 MG tablet Take 1 tablet (50 mg total) by mouth daily. 90 tablet 3  . Multiple Vitamin (MULTIVITAMIN WITH MINERALS) TABS Take 1 tablet by mouth daily.     . niacin (NIASPAN) 500 MG CR tablet TAKE 1 TABLET BY MOUTH DAILY 90 tablet 0  . simvastatin (ZOCOR) 80 MG tablet Take 1 tablet (80 mg total) by mouth at bedtime. 90 tablet 3  . carvedilol (COREG) 12.5 MG tablet TAKE 1 TABLET BY MOUTH TWICE DAILY WITH A MEAL 60 tablet 3   No current facility-administered medications for this visit.     C/C: Patient presents for denture recall. Patient denies having any dental problems.  HPI:  Russell Martin was seen on 03/17/2013 as part of a pre-heart valve surgery dental protocol. Patient had remaining teeth extracted with alveoloplasty and pre-prosthetic surgery as indicated in the operating room on 03/20/2013. After adequate healing, the dentures were fabricated and inserted in on 06/18/2013. Patient has had multiple denture adjustment appointments since that time. The patient now presents for periodic  oral examination and evaluation of the upper and lower complete dentures.  DENTAL EXAM: General: Patient is a tall, well-developed, well-nourished male in no acute distress. Vitals: BP (!) 165/87 (BP Location: Left Arm)   Pulse 60   Temp 98.8 F (37.1 C) (Oral)  Extraoral Exam: There is no palpable lymphadenopathy. The patient denies acute TMJ symptoms. Intraoral  Exam: The patient has normal saliva. There is no evidence of denture irritation erythema or other soft tissue lesions. Dentition: The patient is edentulous. Prosthodontic: The patient has an upper and lower complete dentures. These are stable and retentive. Pressure indicating paste was applied to dentures and they were adjusted as needed minimally. The dentures were polished.  Calculus on some of the posterior denture teeth was removed and patient was instructed on keeping dentures clean. A small chip on the incisal edge of tooth #9 was noted. I discussed tooth repair with a dental lab for adjustment of the incisal chip with polishing. Patient wished to proceed with this minor adjustments. He adjustment was performed the dentures were again polished. Patient accepted the results of this adjustment of the incisal edge. Patient accepts results of the adjustment. Occlusion:  The occlusion of the upper and lower complete dentures is acceptable.No adjustments were needed.  Assessments: 1. The patient is edentulous. 2. The patient has acceptable upper and lower complete dentures. 3. Chipped mesioincisal edge of tooth #9 that was adjusted and polished appropriately.  Plan:  1. Patient is to keep dentures out if sore spots arise. 2. Patient to use salt water rinses as needed to aid healing. 3. Patient return to clinic as scheduled. Patient to call if problems arise before then. 4. Call if patient wishes to proceed with replacement of tooth #9 by a dental lab.  [name], DDS

## 2017-03-19 DIAGNOSIS — Z463 Encounter for fitting and adjustment of dental prosthetic device: Secondary | ICD-10-CM

## 2017-03-19 DIAGNOSIS — Z9889 Other specified postprocedural states: Secondary | ICD-10-CM

## 2017-04-02 ENCOUNTER — Other Ambulatory Visit (HOSPITAL_COMMUNITY): Payer: Self-pay | Admitting: Internal Medicine

## 2017-04-08 ENCOUNTER — Other Ambulatory Visit (HOSPITAL_COMMUNITY): Payer: Self-pay | Admitting: Internal Medicine

## 2017-04-11 ENCOUNTER — Ambulatory Visit (INDEPENDENT_AMBULATORY_CARE_PROVIDER_SITE_OTHER): Payer: Federal, State, Local not specified - PPO | Admitting: *Deleted

## 2017-04-11 DIAGNOSIS — I442 Atrioventricular block, complete: Secondary | ICD-10-CM | POA: Diagnosis not present

## 2017-04-11 NOTE — Progress Notes (Signed)
Remote pacemaker transmission.   

## 2017-04-12 ENCOUNTER — Encounter: Payer: Self-pay | Admitting: Cardiology

## 2017-05-16 ENCOUNTER — Telehealth: Payer: Self-pay | Admitting: *Deleted

## 2017-05-16 MED ORDER — APIXABAN 5 MG PO TABS: 5.0000 mg | ORAL_TABLET | Freq: Two times a day (BID) | ORAL | 11 refills | Status: DC

## 2017-05-16 NOTE — Telephone Encounter (Signed)
New AF noted on PPM remote monitoring alert. Started 05/13/17. Reviewed with Dr. Ladona Ridgel- he recommends that [name] start Eliquis 5mg  BID.  Mr. Kollmann made aware of AF and risks. He is agreeable to start Eliquis and will pick up the Rx to start tomorrow morning. He is aware to call our office if he starts to notice any bloody stools or signs of bleeding.

## 2017-05-17 ENCOUNTER — Telehealth: Payer: Self-pay | Admitting: Internal Medicine

## 2017-05-17 NOTE — Telephone Encounter (Signed)
Follow up     Pt c/o medication issue:  1. Name of Medication: eliquis  2. How are you currently taking this medication (dosage and times per day)? 2x a day started this morning has already taken 1 tablet   3. Are you having a reaction (difficulty breathing--STAT)? no  4. What is your medication issue? Should he continue aspirin and eliquis

## 2017-05-17 NOTE — Telephone Encounter (Signed)
Call received from wife asking if Pt should continue taking ASA 81 mg since he has been started on Eliquis 5 mg bid.  Per review of Pt history, Pt with 2 replaced heart valves.  Advised wife that Pt should continue on the ASA 81 mg at this time.  Provided education on being aware if any increased bleeding such as bruising or dark stools.  Educated to call office if Pt has any s/s of increased bleeding.  Wife indicates understanding.  Notified to call if any issues.

## 2017-05-17 NOTE — Telephone Encounter (Signed)
New message    Pt c/o medication issue:  1. Name of Medication: apixaban (ELIQUIS) 5 MG TABS tablet and aspirin  2. How are you currently taking this medication (dosage and times per day)? 5mg   3. Are you having a reaction (difficulty breathing--STAT)? no  4. What is your medication issue? Pt wife calling to see if he is supposed to continue taking these two together

## 2017-05-20 LAB — CUP PACEART REMOTE DEVICE CHECK
Battery Remaining Percentage: 65 %
Battery Voltage: 2.9 V
Brady Statistic AP VP Percent: 31 %
Brady Statistic AS VS Percent: 1 %
Brady Statistic RA Percent Paced: 31 %
Date Time Interrogation Session: 20180802065825
Implantable Lead Implant Date: 20010807
Implantable Lead Implant Date: 20140307
Implantable Lead Location: 753860
Lead Channel Impedance Value: 380 Ohm
Lead Channel Pacing Threshold Amplitude: 0.75 V
Lead Channel Pacing Threshold Amplitude: 0.875 V
Lead Channel Pacing Threshold Amplitude: 0.875 V
Lead Channel Pacing Threshold Pulse Width: 0.4 ms
Lead Channel Pacing Threshold Pulse Width: 0.4 ms
Lead Channel Sensing Intrinsic Amplitude: 2.7 mV
Lead Channel Setting Pacing Amplitude: 2 V
Lead Channel Setting Pacing Amplitude: 2 V
Lead Channel Setting Pacing Pulse Width: 0.4 ms
Lead Channel Setting Pacing Pulse Width: 0.5 ms
MDC IDC LEAD IMPLANT DT: 20010807
MDC IDC LEAD LOCATION: 753858
MDC IDC LEAD LOCATION: 753859
MDC IDC MSMT BATTERY REMAINING LONGEVITY: 62 mo
MDC IDC MSMT LEADCHNL LV IMPEDANCE VALUE: 690 Ohm
MDC IDC MSMT LEADCHNL LV PACING THRESHOLD PULSEWIDTH: 0.5 ms
MDC IDC MSMT LEADCHNL RV IMPEDANCE VALUE: 280 Ohm
MDC IDC MSMT LEADCHNL RV SENSING INTR AMPL: 12 mV
MDC IDC PG IMPLANT DT: 20140307
MDC IDC PG SERIAL: 2882239
MDC IDC SET LEADCHNL RA PACING AMPLITUDE: 2 V
MDC IDC SET LEADCHNL RV SENSING SENSITIVITY: 5 mV
MDC IDC STAT BRADY AP VS PERCENT: 1 %
MDC IDC STAT BRADY AS VP PERCENT: 69 %
Pulse Gen Model: 3210

## 2017-05-24 ENCOUNTER — Other Ambulatory Visit: Payer: Self-pay | Admitting: Internal Medicine

## 2017-05-29 ENCOUNTER — Telehealth: Payer: Self-pay | Admitting: Internal Medicine

## 2017-05-29 NOTE — Telephone Encounter (Signed)
Walk In pt Form-patient calling about prescriptions being transferred. Placed in [employer].

## 2017-05-30 ENCOUNTER — Telehealth: Payer: Self-pay

## 2017-05-30 MED ORDER — CARVEDILOL 12.5 MG PO TABS: 12.5000 mg | ORAL_TABLET | Freq: Two times a day (BID) | ORAL | 11 refills | Status: DC

## 2017-05-30 MED ORDER — SIMVASTATIN 80 MG PO TABS: 80.0000 mg | ORAL_TABLET | Freq: Every day | ORAL | 3 refills | Status: DC

## 2017-05-30 MED ORDER — NIACIN ER (ANTIHYPERLIPIDEMIC) 500 MG PO TBCR: 500.0000 mg | EXTENDED_RELEASE_TABLET | Freq: Every day | ORAL | 3 refills | Status: DC

## 2017-05-30 MED ORDER — LOSARTAN POTASSIUM 50 MG PO TABS: 50.0000 mg | ORAL_TABLET | Freq: Every day | ORAL | 3 refills | Status: DC

## 2017-05-30 MED ORDER — APIXABAN 5 MG PO TABS: 5.0000 mg | ORAL_TABLET | Freq: Two times a day (BID) | ORAL | 11 refills | Status: DC

## 2017-05-30 NOTE — Telephone Encounter (Signed)
Received request to change Pt to [facility] for prescriptions.  Sent new scripts to [facility] mail order.  No further action needed.

## 2017-07-11 ENCOUNTER — Ambulatory Visit (INDEPENDENT_AMBULATORY_CARE_PROVIDER_SITE_OTHER): Payer: Federal, State, Local not specified - PPO | Admitting: *Deleted

## 2017-07-11 DIAGNOSIS — I442 Atrioventricular block, complete: Secondary | ICD-10-CM | POA: Diagnosis not present

## 2017-07-12 ENCOUNTER — Encounter: Payer: Self-pay | Admitting: Cardiology

## 2017-07-12 NOTE — Progress Notes (Signed)
Remote pacemaker transmission.   

## 2017-07-15 ENCOUNTER — Other Ambulatory Visit: Payer: Self-pay | Admitting: *Deleted

## 2017-07-15 MED ORDER — APIXABAN 5 MG PO TABS: 5.0000 mg | ORAL_TABLET | Freq: Two times a day (BID) | ORAL | 3 refills | Status: DC

## 2017-08-07 LAB — CUP PACEART REMOTE DEVICE CHECK
Battery Remaining Longevity: 62 mo
Battery Remaining Percentage: 65 %
Battery Voltage: 2.9 V
Implantable Lead Implant Date: 20010807
Implantable Lead Implant Date: 20140307
Implantable Lead Location: 753860
Implantable Pulse Generator Implant Date: 20140307
Lead Channel Impedance Value: 290 Ohm
Lead Channel Impedance Value: 380 Ohm
Lead Channel Pacing Threshold Amplitude: 0.75 V
Lead Channel Pacing Threshold Amplitude: 0.875 V
Lead Channel Pacing Threshold Pulse Width: 0.5 ms
Lead Channel Sensing Intrinsic Amplitude: 3.2 mV
Lead Channel Setting Pacing Amplitude: 2 V
Lead Channel Setting Pacing Amplitude: 2 V
Lead Channel Setting Pacing Pulse Width: 0.4 ms
Lead Channel Setting Pacing Pulse Width: 0.5 ms
Lead Channel Setting Sensing Sensitivity: 5 mV
MDC IDC LEAD IMPLANT DT: 20010807
MDC IDC LEAD LOCATION: 753858
MDC IDC LEAD LOCATION: 753859
MDC IDC MSMT LEADCHNL LV IMPEDANCE VALUE: 660 Ohm
MDC IDC MSMT LEADCHNL RA PACING THRESHOLD PULSEWIDTH: 0.4 ms
MDC IDC MSMT LEADCHNL RV PACING THRESHOLD AMPLITUDE: 0.75 V
MDC IDC MSMT LEADCHNL RV PACING THRESHOLD PULSEWIDTH: 0.4 ms
MDC IDC MSMT LEADCHNL RV SENSING INTR AMPL: 12 mV
MDC IDC SESS DTM: 20181101072756
MDC IDC SET LEADCHNL LV PACING AMPLITUDE: 2 V
MDC IDC STAT BRADY AP VP PERCENT: 32 %
MDC IDC STAT BRADY AP VS PERCENT: 1 %
MDC IDC STAT BRADY AS VP PERCENT: 68 %
MDC IDC STAT BRADY AS VS PERCENT: 1 %
MDC IDC STAT BRADY RA PERCENT PACED: 29 %
Pulse Gen Serial Number: 2882239

## 2017-08-30 ENCOUNTER — Other Ambulatory Visit: Payer: Self-pay | Admitting: Gastroenterology

## 2017-08-30 DIAGNOSIS — Z1211 Encounter for screening for malignant neoplasm of colon: Secondary | ICD-10-CM

## 2017-10-04 ENCOUNTER — Ambulatory Visit
Admission: RE | Admit: 2017-10-04 | Discharge: 2017-10-04 | Disposition: A | Discharge status: Home / Self Care | Payer: Federal, State, Local not specified - PPO | Source: Ambulatory Visit | Attending: Gastroenterology | Admitting: Gastroenterology

## 2017-10-04 DIAGNOSIS — Z1211 Encounter for screening for malignant neoplasm of colon: Secondary | ICD-10-CM

## 2017-10-10 ENCOUNTER — Ambulatory Visit (INDEPENDENT_AMBULATORY_CARE_PROVIDER_SITE_OTHER): Payer: Federal, State, Local not specified - PPO | Admitting: *Deleted

## 2017-10-10 DIAGNOSIS — I442 Atrioventricular block, complete: Secondary | ICD-10-CM

## 2017-10-10 NOTE — Progress Notes (Signed)
Remote pacemaker transmission.   

## 2017-10-11 ENCOUNTER — Encounter: Payer: Self-pay | Admitting: Cardiology

## 2017-10-11 NOTE — Progress Notes (Signed)
Letter  

## 2017-10-25 LAB — CUP PACEART REMOTE DEVICE CHECK
Brady Statistic AP VP Percent: 32 %
Brady Statistic AP VS Percent: 1 %
Brady Statistic AS VP Percent: 68 %
Brady Statistic AS VS Percent: 1 %
Implantable Lead Implant Date: 20010807
Implantable Lead Implant Date: 20140307
Implantable Lead Location: 753860
Lead Channel Impedance Value: 280 Ohm
Lead Channel Pacing Threshold Amplitude: 0.75 V
Lead Channel Pacing Threshold Amplitude: 1 V
Lead Channel Pacing Threshold Pulse Width: 0.4 ms
Lead Channel Pacing Threshold Pulse Width: 0.5 ms
Lead Channel Sensing Intrinsic Amplitude: 12 mV
Lead Channel Sensing Intrinsic Amplitude: 3.6 mV
Lead Channel Setting Pacing Amplitude: 2 V
Lead Channel Setting Sensing Sensitivity: 5 mV
MDC IDC LEAD IMPLANT DT: 20010807
MDC IDC LEAD LOCATION: 753858
MDC IDC LEAD LOCATION: 753859
MDC IDC MSMT BATTERY REMAINING LONGEVITY: 55 mo
MDC IDC MSMT BATTERY REMAINING PERCENTAGE: 57 %
MDC IDC MSMT BATTERY VOLTAGE: 2.89 V
MDC IDC MSMT LEADCHNL LV IMPEDANCE VALUE: 640 Ohm
MDC IDC MSMT LEADCHNL RA IMPEDANCE VALUE: 360 Ohm
MDC IDC MSMT LEADCHNL RA PACING THRESHOLD AMPLITUDE: 0.75 V
MDC IDC MSMT LEADCHNL RA PACING THRESHOLD PULSEWIDTH: 0.4 ms
MDC IDC PG IMPLANT DT: 20140307
MDC IDC SESS DTM: 20190129080220
MDC IDC SET LEADCHNL LV PACING AMPLITUDE: 2 V
MDC IDC SET LEADCHNL LV PACING PULSEWIDTH: 0.5 ms
MDC IDC SET LEADCHNL RA PACING AMPLITUDE: 2 V
MDC IDC SET LEADCHNL RV PACING PULSEWIDTH: 0.4 ms
MDC IDC STAT BRADY RA PERCENT PACED: 25 %
Pulse Gen Serial Number: 2882239

## 2017-12-22 ENCOUNTER — Other Ambulatory Visit (HOSPITAL_COMMUNITY): Payer: Self-pay | Admitting: Internal Medicine

## 2018-01-09 ENCOUNTER — Encounter: Payer: Federal, State, Local not specified - PPO | Admitting: *Deleted

## 2018-01-09 ENCOUNTER — Telehealth: Payer: Self-pay | Admitting: Cardiology

## 2018-01-09 NOTE — Telephone Encounter (Signed)
Confirmed remote transmission w/ pt wife.   

## 2018-01-16 ENCOUNTER — Encounter: Payer: Self-pay | Admitting: Cardiology

## 2018-02-04 ENCOUNTER — Ambulatory Visit (INDEPENDENT_AMBULATORY_CARE_PROVIDER_SITE_OTHER): Payer: Federal, State, Local not specified - PPO | Admitting: *Deleted

## 2018-02-04 DIAGNOSIS — I442 Atrioventricular block, complete: Secondary | ICD-10-CM | POA: Diagnosis not present

## 2018-02-04 NOTE — Progress Notes (Signed)
Remote pacemaker transmission.   

## 2018-02-07 ENCOUNTER — Encounter: Payer: Self-pay | Admitting: Cardiology

## 2018-02-13 ENCOUNTER — Encounter: Payer: Self-pay | Admitting: Internal Medicine

## 2018-02-18 DIAGNOSIS — Z1159 Encounter for screening for other viral diseases: Secondary | ICD-10-CM | POA: Diagnosis not present

## 2018-02-20 LAB — CUP PACEART REMOTE DEVICE CHECK
Implantable Lead Implant Date: 20010807
Implantable Lead Implant Date: 20140307
Implantable Lead Location: 753860
Implantable Pulse Generator Implant Date: 20140307
MDC IDC LEAD IMPLANT DT: 20010807
MDC IDC LEAD LOCATION: 753858
MDC IDC LEAD LOCATION: 753859
MDC IDC PG SERIAL: 2882239
MDC IDC SESS DTM: 20190613113057
Pulse Gen Model: 3210

## 2018-03-07 ENCOUNTER — Ambulatory Visit (INDEPENDENT_AMBULATORY_CARE_PROVIDER_SITE_OTHER): Payer: Federal, State, Local not specified - PPO | Admitting: Internal Medicine

## 2018-03-07 ENCOUNTER — Encounter: Payer: Self-pay | Admitting: Internal Medicine

## 2018-03-07 VITALS — BP 162/98 | HR 70 | Ht 70.5 in | Wt 228.0 lb

## 2018-03-07 DIAGNOSIS — Z950 Presence of cardiac pacemaker: Secondary | ICD-10-CM | POA: Diagnosis not present

## 2018-03-07 DIAGNOSIS — I442 Atrioventricular block, complete: Secondary | ICD-10-CM | POA: Diagnosis not present

## 2018-03-07 DIAGNOSIS — I10 Essential (primary) hypertension: Secondary | ICD-10-CM

## 2018-03-07 DIAGNOSIS — I4892 Unspecified atrial flutter: Secondary | ICD-10-CM

## 2018-03-07 DIAGNOSIS — I5022 Chronic systolic (congestive) heart failure: Secondary | ICD-10-CM | POA: Diagnosis not present

## 2018-03-07 NOTE — Progress Notes (Addendum)
HPI Russell Martin returns today for evaluation of atrial flutter. He is a pleasant 67 yo man with a h/o chronic systolic heart failure, s/p biv PPM insertion, who has developed symptomatic atrial flutter. He denies chest pain but has felt some palpitations. He has not had syncope. His dyspnea has worsened some. He has a h/o MV repair and improvement of his LV function after undergoing biv upgrade. He has not had syncope. Minimal edema. No Known Allergies   Current Outpatient Medications  Medication Sig Dispense Refill  . apixaban (ELIQUIS) 5 MG TABS tablet Take 1 tablet (5 mg total) 2 (two) times daily by mouth. 180 tablet 3  . aspirin EC 81 MG tablet Take 1 tablet (81 mg total) by mouth daily.    . carvedilol (COREG) 12.5 MG tablet Take 1 tablet (12.5 mg total) by mouth 2 (two) times daily with a meal. 60 tablet 11  . losartan (COZAAR) 50 MG tablet Take 1 tablet (50 mg total) by mouth daily. 90 tablet 3  . Multiple Vitamin (MULTIVITAMIN WITH MINERALS) TABS Take 1 tablet by mouth daily.     . niacin (NIASPAN) 500 MG CR tablet Take 1 tablet (500 mg total) by mouth daily. 90 tablet 3  . simvastatin (ZOCOR) 80 MG tablet Take 1 tablet (80 mg total) by mouth at bedtime. 90 tablet 3   No current facility-administered medications for this visit.      Past Medical History:  Diagnosis Date  . Cardiomyopathy (HCC) 1.10.14   reduced EF 30-35%  . CHB (complete heart block) (HCC)    a. Pacer initially placed ~2002.   [name] CHF (congestive heart failure) (HCC)    a. NICM EF 35% - no CAD by cath 09/2012. b. s/p upgrade to Bi-V pacemaker 11/2012 (did not get ICD as Dr. Ladona Ridgel expects his EF will improve with bi-V pacing).  [name] Dyspnea    2d Echo, EF 40-45%, 5.20.14  . Hyperlipidemia   . Hypertension    pt denies HTN  . LBBB (left bundle branch block)    Myoview, LV enlarged  . Mitral valve regurgitation   . Pacemaker    st jude    greg [place]  . RBBB   . S/P mitral valve repair 04/29/2013   28mm  Sorin Memo 3D ring annuloplasty via right mini thoracotomy  . S/P tricuspid valve repair 04/29/2013   28 mm Edwards mc3 ring annuloplasty via right mini thoracotomy    ROS:   All systems reviewed and negative except as noted in the HPI.   Past Surgical History:  Procedure Laterality Date  . BI-VENTRICULAR PACEMAKER UPGRADE N/A 11/14/2012   Procedure: BI-VENTRICULAR PACEMAKER UPGRADE;  Surgeon: [name], MD;  Location: [facility] CATH LAB;  Service: Cardiovascular;  Laterality: N/A;  . CARDIAC CATHETERIZATION     clean  . INTRAOPERATIVE TRANSESOPHAGEAL ECHOCARDIOGRAM N/A 04/29/2013   Procedure: INTRAOPERATIVE TRANSESOPHAGEAL ECHOCARDIOGRAM;  Surgeon: [name], MD;  Location: [facility] OR;  Service: Open Heart Surgery;  Laterality: N/A;  . LEFT AND RIGHT HEART CATHETERIZATION WITH CORONARY ANGIOGRAM N/A 09/19/2012   Procedure: LEFT AND RIGHT HEART CATHETERIZATION WITH CORONARY ANGIOGRAM;  Surgeon: [name], MD;  Location: [facility] CATH LAB;  Service: Cardiovascular;  Laterality: N/A;  . MINIMALLY INVASIVE TRICUSPID VALVE REPAIR Right 04/29/2013   Procedure: MINIMALLY INVASIVE TRICUSPID VALVE REPAIR;  Surgeon: [name], MD;  Location: [facility] OR;  Service: Open Heart Surgery;  Laterality: Right;  . MITRAL VALVE REPAIR Right 04/29/2013  Procedure: MINIMALLY INVASIVE MITRAL VALVE REPAIR (MVR);  Surgeon: [name], MD;  Location: [facility] OR;  Service: Open Heart Surgery;  Laterality: Right;  . MULTIPLE EXTRACTIONS WITH ALVEOLOPLASTY N/A 03/20/2013   Procedure: Extraction of tooth #'s 1,6,7,8,9,10,16,17,20,21,22,23,24,25,26,27,28, 29 with alveoloplasty;  Surgeon: [name], DDS;  Location: [facility] OR;  Service: Oral Surgery;  Laterality: N/A;  . PACEMAKER PLACEMENT    . TEE WITHOUT CARDIOVERSION N/A 04/09/2013   Procedure: TRANSESOPHAGEAL ECHOCARDIOGRAM (TEE);  Surgeon: Dolores Patty, MD;  Location: [facility] ENDOSCOPY;  Service: Cardiovascular;  Laterality: N/A;     History reviewed. No pertinent  family history.   Social History   Socioeconomic History  . Marital status: Married    Spouse name: Not on file  . Number of children: 1  . Years of education: Not on file  . Highest education level: Not on file  Occupational History    Employer: bank note  Social Needs  . Financial resource strain: Not on file  . Food insecurity:    Worry: Not on file    Inability: Not on file  . Transportation needs:    Medical: Not on file    Non-medical: Not on file  Tobacco Use  . Smoking status: Former Games developer  . Smokeless tobacco: Never Used  Substance and Sexual Activity  . Alcohol use: No  . Drug use: No  . Sexual activity: Yes  Lifestyle  . Physical activity:    Days per week: Not on file    Minutes per session: Not on file  . Stress: Not on file  Relationships  . Social connections:    Talks on phone: Not on file    Gets together: Not on file    Attends religious service: Not on file    Active member of club or organization: Not on file    Attends meetings of clubs or organizations: Not on file    Relationship status: Not on file  . Intimate partner violence:    Fear of current or ex partner: Not on file    Emotionally abused: Not on file    Physically abused: Not on file    Forced sexual activity: Not on file  Other Topics Concern  . Not on file  Social History Narrative   Patient is married. Patient has one grown son that lives in [place].   Patient with a history of smoking but quit approximately 10 years ago. Patient denies use of alcohol or other illicit drugs. Patient has never used smokeless tobacco.     BP (!) 162/98   Pulse 70   Ht 5' 10.5" (1.791 m)   Wt 228 lb (103.4 kg)   SpO2 98%   BMI 32.25 kg/m   Physical Exam:  Well appearing NAD HEENT: Unremarkable Neck:  No JVD, no thyromegally Lymphatics:  No adenopathy Back:  No CVA tenderness Lungs:  Clear with no wheezes HEART:  Regular rate rhythm, no murmurs, no rubs, no clicks Abd:  soft,  positive bowel sounds, no organomegally, no rebound, no guarding Ext:  2 plus pulses, no edema, no cyanosis, no clubbing Skin:  No rashes no nodules Neuro:  CN II through XII intact, motor grossly intact  EKG - atrial flutter with a controlled VR  DEVICE  Normal device function.  See PaceArt for details.   Assess/Plan: 1. Atrial flutter - this is new diagnosis. I have discussed the treatment options. He has been on systemic anti-coag. I have reviewed the risks/ benefitis/goals/expectations of  EP study and ablation of atrial flutter and he wishes to proceed. 2. PPM - his St. Jude Biv PPM is working normally. Will recheck in several months. 3. Chronic systolic heart failure - his symptoms have worsened slightly. I have discussed the risks/benefits/goals/expectations of EP study and catheter ablation and he wishes to proceed.  Leonia Reeves.D.

## 2018-03-07 NOTE — Patient Instructions (Addendum)
Medication Instructions:  Your physician recommends that you continue on your current medications as directed. Please refer to the Current Medication list given to you today.  Labwork: You will get lab work within 30 days of your procedure:  BMP and CBC.  Please schedule.  Testing/Procedures: Your physician has recommended that you have an ablation. Catheter ablation is a medical procedure used to treat some cardiac arrhythmias (irregular heartbeats). During catheter ablation, a long, thin, flexible tube is put into a blood vessel in your groin (upper thigh), or neck. This tube is called an ablation catheter. It is then guided to your heart through the blood vessel. Radio frequency waves destroy small areas of heart tissue where abnormal heartbeats may cause an arrhythmia to start. Please see the instruction sheet given to you today.  Follow-Up:  You will follow up with Dr.Taylor 4 weeks after your procedure.   ABLATION INSTRUCTIONS:  Please arrive at the [facility] main entrance of [facility] at:  5:30 am on April 07, 2018  Do not eat or drink after midnight prior to procedure  Take your  Normal medications the morning of the procedure Hold your carvedilol for 2 days prior to your procedure.  Your last dose will be April 04, 2018 your evening dose  Plan for one night stay-but you should be discharged after your procedure You will need someone to drive you home at discharge  If you need a refill on your cardiac medications before your next appointment, please call your pharmacy.   Cardiac Ablation Cardiac ablation is a procedure to disable (ablate) a small amount of heart tissue in very specific places. The heart has many electrical connections. Sometimes these connections are abnormal and can cause the heart to beat very fast or irregularly. Ablating some of the problem areas can improve the heart rhythm or return it to normal. Ablation may be done for people who:  Have  Wolff-Parkinson-White syndrome.  Have fast heart rhythms (tachycardia).  Have taken medicines for an abnormal heart rhythm (arrhythmia) that were not effective or caused side effects.  Have a high-risk heartbeat that may be life-threatening.  During the procedure, a small incision is made in the neck or the groin, and a long, thin, flexible tube (catheter) is inserted into the incision and moved to the heart. Small devices (electrodes) on the tip of the catheter will send out electrical currents. A type of X-ray (fluoroscopy) will be used to help guide the catheter and to provide images of the heart. Tell a health care provider about:  Any allergies you have.  All medicines you are taking, including vitamins, herbs, eye drops, creams, and over-the-counter medicines.  Any problems you or family members have had with anesthetic medicines.  Any blood disorders you have.  Any surgeries you have had.  Any medical conditions you have, such as kidney failure.  Whether you are pregnant or may be pregnant. What are the risks? Generally, this is a safe procedure. However, problems may occur, including:  Infection.  Bruising and bleeding at the catheter insertion site.  Bleeding into the chest, especially into the sac that surrounds the heart. This is a serious complication.  Stroke or blood clots.  Damage to other structures or organs.  Allergic reaction to medicines or dyes.  Need for a permanent pacemaker if the normal electrical system is damaged. A pacemaker is a small computer that sends electrical signals to the heart and helps your heart beat normally.  The procedure not  being fully effective. This may not be recognized until months later. Repeat ablation procedures are sometimes required.  What happens before the procedure?  Follow instructions from your health care provider about eating or drinking restrictions.  Ask your health care provider about: ? Changing or  stopping your regular medicines. This is especially important if you are taking diabetes medicines or blood thinners. ? Taking medicines such as aspirin and ibuprofen. These medicines can thin your blood. Do not take these medicines before your procedure if your health care provider instructs you not to.  Plan to have someone take you home from the hospital or clinic.  If you will be going home right after the procedure, plan to have someone with you for 24 hours. What happens during the procedure?  To lower your risk of infection: ? Your health care team will wash or sanitize their hands. ? Your skin will be washed with soap. ? Hair may be removed from the incision area.  An IV tube will be inserted into one of your veins.  You will be given a medicine to help you relax (sedative).  The skin on your neck or groin will be numbed.  An incision will be made in your neck or your groin.  A needle will be inserted through the incision and into a large vein in your neck or groin.  A catheter will be inserted into the needle and moved to your heart.  Dye may be injected through the catheter to help your surgeon see the area of the heart that needs treatment.  Electrical currents will be sent from the catheter to ablate heart tissue in desired areas. There are three types of energy that may be used to ablate heart tissue: ? Heat (radiofrequency energy). ? Laser energy. ? Extreme cold (cryoablation).  When the necessary tissue has been ablated, the catheter will be removed.  Pressure will be held on the catheter insertion area to prevent excessive bleeding.  A bandage (dressing) will be placed over the catheter insertion area. The procedure may vary among health care providers and hospitals. What happens after the procedure?  Your blood pressure, heart rate, breathing rate, and blood oxygen level will be monitored until the medicines you were given have worn off.  Your catheter  insertion area will be monitored for bleeding. You will need to lie still for a few hours to ensure that you do not bleed from the catheter insertion area.  Do not drive for 24 hours or as long as directed by your health care provider. Summary  Cardiac ablation is a procedure to disable (ablate) a small amount of heart tissue in very specific places. Ablating some of the problem areas can improve the heart rhythm or return it to normal.  During the procedure, electrical currents will be sent from the catheter to ablate heart tissue in desired areas. This information is not intended to replace advice given to you by your health care provider. Make sure you discuss any questions you have with your health care provider. Document Released: 01/13/2009 Document Revised: 07/16/2016 Document Reviewed: 07/16/2016 Elsevier Interactive Patient Education  [employer].

## 2018-03-11 ENCOUNTER — Encounter (HOSPITAL_COMMUNITY): Payer: Self-pay | Admitting: Dentistry

## 2018-03-11 ENCOUNTER — Ambulatory Visit (HOSPITAL_COMMUNITY): Payer: Self-pay | Admitting: Dentistry

## 2018-03-11 VITALS — BP 196/112 | HR 70 | Temp 97.7°F

## 2018-03-11 DIAGNOSIS — Z463 Encounter for fitting and adjustment of dental prosthetic device: Secondary | ICD-10-CM

## 2018-03-11 DIAGNOSIS — K08109 Complete loss of teeth, unspecified cause, unspecified class: Secondary | ICD-10-CM

## 2018-03-11 DIAGNOSIS — Z972 Presence of dental prosthetic device (complete) (partial): Secondary | ICD-10-CM

## 2018-03-11 DIAGNOSIS — Z9889 Other specified postprocedural states: Secondary | ICD-10-CM

## 2018-03-11 DIAGNOSIS — K000 Anodontia: Secondary | ICD-10-CM

## 2018-03-11 NOTE — Patient Instructions (Addendum)
Plan:  1. Patient is to keep dentures out if sore spots arise. 2. Patient to use salt water rinses as needed to aid healing. 3. Patient return to clinic as scheduled. Patient to call if problems arise before then. 4. Follow-up with Dr. Ladona Ridgel for evaluation of blood pressure as indicated.  [name], DDS

## 2018-03-11 NOTE — Progress Notes (Signed)
03/11/2018  Patient:            Russell Martin Date of Birth:  20-Dec-1950 MRN:                [id number]  BP (!) 196/112 (BP Location: Right Arm)   Pulse 70   Temp 97.7 F (36.5 C)  Patient denies having any symptoms associated with the elevated blood pressure. Patient denies dizziness, headache, or other adverse effect. The patient indicates that he usually takes his blood pressure medicine at 11 AM. We discussed follow-up with his cardiologist for urgent evaluation of his blood pressure. Patient refused this time. We also discussed possible changing of the morning dose of blood pressure medicine to be approximately 6 AM when he arises and to take his second carvedilol dose in the evening at 5 PM with dinner. Patient expresses understanding.   Russell Martin is a 67 year old male that presents for periodic oral exam and evaluation of upper and lower complete dentures. Patient was recently evaluated by his cardiologist, Dr. [name], on 03/07/2018.  Patient recently diagnosed with atrial flutter. Patient with anticipated atrial flutter ablation procedure in later July with Dr. Ladona Ridgel.  Patient Active Problem List   Diagnosis Date Noted  . S/P mitral valve repair 04/29/2013  . S/P tricuspid valve repair 04/29/2013  . MR (mitral regurgitation) 03/11/2013  . Tricuspid valve regurgitation 03/11/2013  . Mitral regurgitation 03/03/2013  . Chronic systolic heart failure (HCC) 10/21/2012  . Complete heart block (HCC) 10/21/2012  . Essential hypertension 10/21/2012  . Pacemaker 10/21/2012     Medical Hx Update:  Past Medical History:  Diagnosis Date  . Cardiomyopathy (HCC) 1.10.14   reduced EF 30-35%  . CHB (complete heart block) (HCC)    a. Pacer initially placed ~2002.   [name] CHF (congestive heart failure) (HCC)    a. NICM EF 35% - no CAD by cath 09/2012. b. s/p upgrade to Bi-V pacemaker 11/2012 (did not get ICD as Dr. Ladona Ridgel expects his EF will improve with bi-V pacing).  [name] Dyspnea    2d  Echo, EF 40-45%, 5.20.14  . Hyperlipidemia   . Hypertension    pt denies HTN  . LBBB (left bundle branch block)    Myoview, LV enlarged  . Mitral valve regurgitation   . Pacemaker    st jude    greg taylor  . RBBB   . S/P mitral valve repair 04/29/2013   28mm Sorin Memo 3D ring annuloplasty via right mini thoracotomy  . S/P tricuspid valve repair 04/29/2013   28 mm Edwards mc3 ring annuloplasty via right mini thoracotomy  .  ALLERGIES/ADVERSE DRUG REACTIONS: No Known Allergies  MEDICATIONS: Current Outpatient Medications  Medication Sig Dispense Refill  . apixaban (ELIQUIS) 5 MG TABS tablet Take 1 tablet (5 mg total) 2 (two) times daily by mouth. 180 tablet 3  . aspirin EC 81 MG tablet Take 1 tablet (81 mg total) by mouth daily.    . carvedilol (COREG) 12.5 MG tablet Take 1 tablet (12.5 mg total) by mouth 2 (two) times daily with a meal. 60 tablet 11  . cholecalciferol (VITAMIN D) 1000 units tablet Take 2,000 Units by mouth daily.    [name] losartan (COZAAR) 50 MG tablet Take 1 tablet (50 mg total) by mouth daily. 90 tablet 3  . Multiple Vitamin (MULTIVITAMIN WITH MINERALS) TABS Take 1 tablet by mouth daily.     . niacin (NIASPAN) 500 MG CR tablet Take 1 tablet (500  mg total) by mouth daily. 90 tablet 3  . simvastatin (ZOCOR) 80 MG tablet Take 1 tablet (80 mg total) by mouth at bedtime. 90 tablet 3   No current facility-administered medications for this visit.      C/C: Patient presents for denture recall. Patient denies having any dental problems.  HPI:  Russell Martin was originally seen on 03/17/2013 as part of a pre-heart valve surgery dental protocol. Patient had remaining teeth extracted with alveoloplasty and pre-prosthetic surgery as indicated in the operating room on 03/20/2013. After adequate healing, the dentures were fabricated and inserted in on 06/18/2013. Patient has had multiple denture adjustment appointments since that time. The patient now presents for periodic oral  examination and evaluation of the upper and lower complete dentures.  DENTAL EXAM: General: Patient is a tall, well-developed, well-nourished male in no acute distress. Vitals: BP (!) 196/112 (BP Location: Right Arm)   Pulse 70   Temp 97.7 F (36.5 C)  Extraoral Exam: There is no palpable lymphadenopathy. The patient denies acute TMJ symptoms. Intraoral  Exam: The patient has normal saliva. There is no evidence of denture irritation erythema or other soft tissue lesions. Dentition: The patient is edentulous. Prosthodontic: The patient has an upper and lower complete dentures. These are stable and retentive. Pressure indicating paste was applied to dentures and they were adjusted as needed minimally. The dentures were polished. Calculus on some of the posterior denture teeth was removed and patient was instructed on keeping dentures clean. Patient accepts results of the adjustment. Patient still indicates that the repair of the chip of the maxillary incisal is acceptable to him. Patient refuses to have the dentures sent to the lab for repair of the maxillary anterior tooth. Occlusion:  The occlusion of the upper and lower complete dentures is acceptable.No adjustments were needed.  Assessments: 1. The patient is edentulous. 2. The patient has acceptable upper and lower complete dentures. 3. Elevated blood pressure at visit dental visit.  Plan:  1. Patient is to keep dentures out if sore spots arise. 2. Patient to use salt water rinses as needed to aid healing. 3. Patient return to clinic as scheduled. Patient to call if problems arise before then. 4. Follow-up with Dr. Ladona Ridgel for evaluation of blood pressure as indicated.  [name], DDS

## 2018-03-19 ENCOUNTER — Other Ambulatory Visit: Payer: Federal, State, Local not specified - PPO

## 2018-03-19 DIAGNOSIS — I4892 Unspecified atrial flutter: Secondary | ICD-10-CM

## 2018-03-20 LAB — CBC WITH DIFFERENTIAL/PLATELET
BASOS: 0 %
Basophils Absolute: 0 10*3/uL (ref 0.0–0.2)
EOS (ABSOLUTE): 0.1 10*3/uL (ref 0.0–0.4)
Eos: 2 %
Hematocrit: 42.9 % (ref 37.5–51.0)
Hemoglobin: 14.2 g/dL (ref 13.0–17.7)
IMMATURE GRANS (ABS): 0 10*3/uL (ref 0.0–0.1)
Immature Granulocytes: 0 %
LYMPHS ABS: 2 10*3/uL (ref 0.7–3.1)
Lymphs: 36 %
MCH: 28.2 pg (ref 26.6–33.0)
MCHC: 33.1 g/dL (ref 31.5–35.7)
MCV: 85 fL (ref 79–97)
MONOS ABS: 0.7 10*3/uL (ref 0.1–0.9)
Monocytes: 13 %
NEUTROS ABS: 2.6 10*3/uL (ref 1.4–7.0)
Neutrophils: 49 %
PLATELETS: 202 10*3/uL (ref 150–450)
RBC: 5.04 x10E6/uL (ref 4.14–5.80)
RDW: 14.2 % (ref 12.3–15.4)
WBC: 5.4 10*3/uL (ref 3.4–10.8)

## 2018-03-20 LAB — BASIC METABOLIC PANEL
BUN / CREAT RATIO: 11 (ref 10–24)
BUN: 14 mg/dL (ref 8–27)
CHLORIDE: 104 mmol/L (ref 96–106)
CO2: 27 mmol/L (ref 20–29)
Calcium: 9.4 mg/dL (ref 8.6–10.2)
Creatinine, Ser: 1.26 mg/dL (ref 0.76–1.27)
GFR, EST AFRICAN AMERICAN: 69 mL/min/{1.73_m2} (ref >59)
GFR, EST NON AFRICAN AMERICAN: 59 mL/min/{1.73_m2} — AB (ref >59)
Glucose: 106 mg/dL — ABNORMAL HIGH (ref 65–99)
Potassium: 4.3 mmol/L (ref 3.5–5.2)
SODIUM: 144 mmol/L (ref 134–144)

## 2018-04-02 ENCOUNTER — Telehealth: Payer: Self-pay

## 2018-04-02 NOTE — Telephone Encounter (Signed)
Pt understands his procedure time has been changed from 0530 to 1000 on Monday July 29. We reviewed pre procedure instructions per his last OV note. Pt agrees to plan and verbalized understanding.

## 2018-04-07 ENCOUNTER — Encounter (HOSPITAL_COMMUNITY)
Admission: RE | Disposition: A | Discharge status: Home / Self Care | Payer: Self-pay | Source: Ambulatory Visit | Attending: Internal Medicine

## 2018-04-07 ENCOUNTER — Other Ambulatory Visit: Payer: Self-pay

## 2018-04-07 ENCOUNTER — Ambulatory Visit (HOSPITAL_COMMUNITY)
Admission: RE | Admit: 2018-04-07 | Discharge: 2018-04-07 | Disposition: A | Discharge status: Home / Self Care | Payer: Federal, State, Local not specified - PPO | Source: Ambulatory Visit | Attending: Internal Medicine | Admitting: Internal Medicine

## 2018-04-07 DIAGNOSIS — Z9889 Other specified postprocedural states: Secondary | ICD-10-CM | POA: Insufficient documentation

## 2018-04-07 DIAGNOSIS — I442 Atrioventricular block, complete: Secondary | ICD-10-CM | POA: Diagnosis not present

## 2018-04-07 DIAGNOSIS — Z87891 Personal history of nicotine dependence: Secondary | ICD-10-CM | POA: Diagnosis not present

## 2018-04-07 DIAGNOSIS — I4510 Unspecified right bundle-branch block: Secondary | ICD-10-CM | POA: Diagnosis not present

## 2018-04-07 DIAGNOSIS — E785 Hyperlipidemia, unspecified: Secondary | ICD-10-CM | POA: Diagnosis not present

## 2018-04-07 DIAGNOSIS — Z7982 Long term (current) use of aspirin: Secondary | ICD-10-CM | POA: Insufficient documentation

## 2018-04-07 DIAGNOSIS — Z950 Presence of cardiac pacemaker: Secondary | ICD-10-CM | POA: Insufficient documentation

## 2018-04-07 DIAGNOSIS — I483 Typical atrial flutter: Secondary | ICD-10-CM | POA: Diagnosis present

## 2018-04-07 DIAGNOSIS — I5022 Chronic systolic (congestive) heart failure: Secondary | ICD-10-CM | POA: Diagnosis not present

## 2018-04-07 DIAGNOSIS — I110 Hypertensive heart disease with heart failure: Secondary | ICD-10-CM | POA: Diagnosis not present

## 2018-04-07 DIAGNOSIS — I429 Cardiomyopathy, unspecified: Secondary | ICD-10-CM | POA: Insufficient documentation

## 2018-04-07 DIAGNOSIS — Z7901 Long term (current) use of anticoagulants: Secondary | ICD-10-CM | POA: Diagnosis not present

## 2018-04-07 DIAGNOSIS — Z79899 Other long term (current) drug therapy: Secondary | ICD-10-CM | POA: Diagnosis not present

## 2018-04-07 HISTORY — PX: A-FLUTTER ABLATION: EP1230

## 2018-04-07 SURGERY — A-FLUTTER ABLATION

## 2018-04-07 MED ORDER — FENTANYL CITRATE (PF) 100 MCG/2ML IJ SOLN
INTRAMUSCULAR | Status: AC
  Filled 2018-04-07: qty 2

## 2018-04-07 MED ORDER — METOPROLOL TARTRATE 5 MG/5ML IV SOLN
INTRAVENOUS | Status: AC
  Filled 2018-04-07: qty 5

## 2018-04-07 MED ORDER — MIDAZOLAM HCL 5 MG/5ML IJ SOLN
INTRAMUSCULAR | Status: AC
  Filled 2018-04-07: qty 5

## 2018-04-07 MED ORDER — ACETAMINOPHEN 325 MG PO TABS
650.0000 mg | ORAL_TABLET | ORAL | Status: DC | PRN
  Filled 2018-04-07: qty 2

## 2018-04-07 MED ORDER — BUPIVACAINE HCL (PF) 0.25 % IJ SOLN
INTRAMUSCULAR | Status: DC | PRN
  Administered 2018-04-07: 20 mL

## 2018-04-07 MED ORDER — HYDRALAZINE HCL 20 MG/ML IJ SOLN
10.0000 mg | Freq: Once | INTRAMUSCULAR | Status: AC
  Administered 2018-04-07: 10 mg via INTRAVENOUS
  Filled 2018-04-07: qty 1

## 2018-04-07 MED ORDER — SODIUM CHLORIDE 0.9 % IV SOLN: 250.0000 mL | INTRAVENOUS | Status: DC | PRN

## 2018-04-07 MED ORDER — SODIUM CHLORIDE 0.9% FLUSH: 3.0000 mL | INTRAVENOUS | Status: DC | PRN

## 2018-04-07 MED ORDER — HEPARIN SODIUM (PORCINE) 1000 UNIT/ML IJ SOLN
INTRAMUSCULAR | Status: AC
  Filled 2018-04-07: qty 1

## 2018-04-07 MED ORDER — MIDAZOLAM HCL 5 MG/5ML IJ SOLN
INTRAMUSCULAR | Status: DC | PRN
  Administered 2018-04-07 (×2): 1 mg via INTRAVENOUS
  Administered 2018-04-07: 2 mg via INTRAVENOUS
  Administered 2018-04-07 (×2): 1 mg via INTRAVENOUS
  Administered 2018-04-07: 2 mg via INTRAVENOUS

## 2018-04-07 MED ORDER — SODIUM CHLORIDE 0.9 % IV SOLN
INTRAVENOUS | Status: DC
  Administered 2018-04-07: 11:00:00 via INTRAVENOUS

## 2018-04-07 MED ORDER — HYDRALAZINE HCL 20 MG/ML IJ SOLN
INTRAMUSCULAR | Status: DC | PRN
  Administered 2018-04-07: 10 mg via INTRAVENOUS

## 2018-04-07 MED ORDER — SODIUM CHLORIDE 0.9% FLUSH: 3.0000 mL | Freq: Two times a day (BID) | INTRAVENOUS | Status: DC

## 2018-04-07 MED ORDER — METOPROLOL TARTRATE 5 MG/5ML IV SOLN
INTRAVENOUS | Status: DC | PRN
  Administered 2018-04-07: 5 mg via INTRAVENOUS

## 2018-04-07 MED ORDER — HYDRALAZINE HCL 20 MG/ML IJ SOLN
INTRAMUSCULAR | Status: AC
  Filled 2018-04-07: qty 1

## 2018-04-07 MED ORDER — HEPARIN (PORCINE) IN NACL 1000-0.9 UT/500ML-% IV SOLN
INTRAVENOUS | Status: DC | PRN
  Administered 2018-04-07: 500 mL

## 2018-04-07 MED ORDER — LABETALOL HCL 5 MG/ML IV SOLN
INTRAVENOUS | Status: AC
  Filled 2018-04-07: qty 4

## 2018-04-07 MED ORDER — ONDANSETRON HCL 4 MG/2ML IJ SOLN: 4.0000 mg | Freq: Four times a day (QID) | INTRAMUSCULAR | Status: DC | PRN

## 2018-04-07 MED ORDER — FENTANYL CITRATE (PF) 100 MCG/2ML IJ SOLN
INTRAMUSCULAR | Status: DC | PRN
  Administered 2018-04-07 (×2): 12.5 ug via INTRAVENOUS
  Administered 2018-04-07: 25 ug via INTRAVENOUS
  Administered 2018-04-07 (×2): 12.5 ug via INTRAVENOUS
  Administered 2018-04-07: 25 ug via INTRAVENOUS

## 2018-04-07 MED ORDER — HEPARIN SODIUM (PORCINE) 1000 UNIT/ML IJ SOLN
INTRAMUSCULAR | Status: DC | PRN
  Administered 2018-04-07: 1000 [IU] via INTRAVENOUS

## 2018-04-07 MED ORDER — BUPIVACAINE HCL (PF) 0.25 % IJ SOLN
INTRAMUSCULAR | Status: AC
  Filled 2018-04-07: qty 30

## 2018-04-07 SURGICAL SUPPLY — 9 items
CATH NAVISTAR SMARTTOUCH FJ (ABLATOR) ×2 IMPLANT
CATH WEBSTER BI DIR CS D-F CRV (CATHETERS) ×2 IMPLANT
PACK EP LATEX FREE (CUSTOM PROCEDURE TRAY) ×1
PACK EP LF (CUSTOM PROCEDURE TRAY) ×1 IMPLANT
PAD DEFIB LIFELINK (PAD) ×2 IMPLANT
PATCH CARTO3 (PAD) ×2 IMPLANT
SHEATH PINNACLE 7F 10CM (SHEATH) ×2 IMPLANT
SHEATH PINNACLE 8F 10CM (SHEATH) ×2 IMPLANT
TUBING SMART ABLATE COOLFLOW (TUBING) ×2 IMPLANT

## 2018-04-07 NOTE — Progress Notes (Signed)
Pt's BP at the end of the case was 170/117.  Dr. Ladona Ridgel provided a verbal order for 5mg  lopressor IV and 10mg  hydralazine IV.

## 2018-04-07 NOTE — H&P (Signed)
HPI [name] returns today for evaluation of atrial flutter. He is a pleasant 67 yo man with a h/o chronic systolic heart failure, s/p biv PPM insertion, who has developed symptomatic atrial flutter. He denies chest pain but has felt some palpitations. He has not had syncope. His dyspnea has worsened some. He has a h/o MV repair and improvement of his LV function after undergoing biv upgrade. He has not had syncope. Minimal edema. No Known Allergies         Current Outpatient Medications  Medication Sig Dispense Refill  . apixaban (ELIQUIS) 5 MG TABS tablet Take 1 tablet (5 mg total) 2 (two) times daily by mouth. 180 tablet 3  . aspirin EC 81 MG tablet Take 1 tablet (81 mg total) by mouth daily.    . carvedilol (COREG) 12.5 MG tablet Take 1 tablet (12.5 mg total) by mouth 2 (two) times daily with a meal. 60 tablet 11  . losartan (COZAAR) 50 MG tablet Take 1 tablet (50 mg total) by mouth daily. 90 tablet 3  . Multiple Vitamin (MULTIVITAMIN WITH MINERALS) TABS Take 1 tablet by mouth daily.     . niacin (NIASPAN) 500 MG CR tablet Take 1 tablet (500 mg total) by mouth daily. 90 tablet 3  . simvastatin (ZOCOR) 80 MG tablet Take 1 tablet (80 mg total) by mouth at bedtime. 90 tablet 3   No current facility-administered medications for this visit.          Past Medical History:  Diagnosis Date  . Cardiomyopathy (HCC) 1.10.14   reduced EF 30-35%  . CHB (complete heart block) (HCC)    a. Pacer initially placed ~2002.   [name] CHF (congestive heart failure) (HCC)    a. NICM EF 35% - no CAD by cath 09/2012. b. s/p upgrade to Bi-V pacemaker 11/2012 (did not get ICD as Dr. Ladona Martin expects his EF will improve with bi-V pacing).  [name] Dyspnea    2d Echo, EF 40-45%, 5.20.14  . Hyperlipidemia   . Hypertension    pt denies HTN  . LBBB (left bundle branch block)    Myoview, LV enlarged  . Mitral valve regurgitation   . Pacemaker    st jude    Russell Martin Russell Martin  . RBBB   . S/P mitral  valve repair 04/29/2013   28mm Russell Martin 3D ring annuloplasty via right mini thoracotomy  . S/P tricuspid valve repair 04/29/2013   28 mm Russell Martin ring annuloplasty via right mini thoracotomy    ROS:   All systems reviewed and negative except as noted in the HPI.        Past Surgical History:  Procedure Laterality Date  . BI-VENTRICULAR PACEMAKER UPGRADE N/A 11/14/2012   Procedure: BI-VENTRICULAR PACEMAKER UPGRADE;  Surgeon: Russell Maw, MD;  Location: [facility] CATH LAB;  Service: Cardiovascular;  Laterality: N/A;  . CARDIAC CATHETERIZATION     clean  . INTRAOPERATIVE TRANSESOPHAGEAL ECHOCARDIOGRAM N/A 04/29/2013   Procedure: INTRAOPERATIVE TRANSESOPHAGEAL ECHOCARDIOGRAM;  Surgeon: Russell Nails, MD;  Location: [facility] OR;  Service: Open Heart Surgery;  Laterality: N/A;  . LEFT AND RIGHT HEART CATHETERIZATION WITH CORONARY ANGIOGRAM N/A 09/19/2012   Procedure: LEFT AND RIGHT HEART CATHETERIZATION WITH CORONARY ANGIOGRAM;  Surgeon: Russell Bihari, MD;  Location: [facility] CATH LAB;  Service: Cardiovascular;  Laterality: N/A;  . MINIMALLY INVASIVE TRICUSPID VALVE REPAIR Right 04/29/2013   Procedure: MINIMALLY INVASIVE TRICUSPID VALVE REPAIR;  Surgeon: Russell Nails, MD;  Location: [facility] OR;  Service: Open Heart  Surgery;  Laterality: Right;  . MITRAL VALVE REPAIR Right 04/29/2013   Procedure: MINIMALLY INVASIVE MITRAL VALVE REPAIR (MVR);  Surgeon: Russell Nails, MD;  Location: [facility] OR;  Service: Open Heart Surgery;  Laterality: Right;  . MULTIPLE EXTRACTIONS WITH ALVEOLOPLASTY N/A 03/20/2013   Procedure: Extraction of tooth #'s 1,6,7,8,9,10,16,17,20,21,22,23,24,25,26,27,28, 29 with alveoloplasty;  Surgeon: Russell Martin, DDS;  Location: [facility] OR;  Service: Oral Surgery;  Laterality: N/A;  . PACEMAKER PLACEMENT    . TEE WITHOUT CARDIOVERSION N/A 04/09/2013   Procedure: TRANSESOPHAGEAL ECHOCARDIOGRAM (TEE);  Surgeon: Russell Patty, MD;  Location: [facility] ENDOSCOPY;  Service: Cardiovascular;   Laterality: N/A;     History reviewed. No pertinent family history.   Social History        Socioeconomic History  . Marital status: Married    Spouse name: Not on file  . Number of children: 1  . Years of education: Not on file  . Highest education level: Not on file  Occupational History    Employer: [employer] note  Social Needs  . Financial resource strain: Not on file  . Food insecurity:    Worry: Not on file    Inability: Not on file  . Transportation needs:    Medical: Not on file    Non-medical: Not on file  Tobacco Use  . Smoking status: Former Games developer  . Smokeless tobacco: Never Used  Substance and Sexual Activity  . Alcohol use: No  . Drug use: No  . Sexual activity: Yes  Lifestyle  . Physical activity:    Days per week: Not on file    Minutes per session: Not on file  . Stress: Not on file  Relationships  . Social connections:    Talks on phone: Not on file    Gets together: Not on file    Attends religious service: Not on file    Active member of club or organization: Not on file    Attends meetings of clubs or organizations: Not on file    Relationship status: Not on file  . Intimate partner violence:    Fear of current or ex partner: Not on file    Emotionally abused: Not on file    Physically abused: Not on file    Forced sexual activity: Not on file  Other Topics Concern  . Not on file  Social History Narrative   Patient is married. Patient has one grown son that lives in [place].   Patient with a history of smoking but quit approximately 10 years ago. Patient denies use of alcohol or other illicit drugs. Patient has never used smokeless tobacco.     BP (!) 162/98   Pulse 70   Ht 5' 10.5" (1.791 m)   Wt 228 lb (103.4 kg)   SpO2 98%   BMI 32.25 kg/m   Physical Exam:  Well appearing NAD HEENT: Unremarkable Neck:  No JVD, no thyromegally Lymphatics:  No adenopathy Back:  No CVA  tenderness Lungs:  Clear with no wheezes HEART:  Regular rate rhythm, no murmurs, no rubs, no clicks Abd:  soft, positive bowel sounds, no organomegally, no rebound, no guarding Ext:  2 plus pulses, no edema, no cyanosis, no clubbing Skin:  No rashes no nodules Neuro:  CN II through XII intact, motor grossly intact  EKG - atrial flutter with a controlled VR  DEVICE  Normal device function.  See PaceArt for details.   Assess/Plan: 1. Atrial flutter - this is new diagnosis. I  have discussed the treatment options. He has been on systemic anti-coag. I have reviewed the risks/ benefitis/goals/expectations of EP study and ablation of atrial flutter and he wishes to proceed. 2. PPM - his St. Jude Biv PPM is working normally. Will recheck in several months. 3. Chronic systolic heart failure - his symptoms have worsened slightly. I have discussed the risks/benefits/goals/expectations of EP study and catheter ablation and he wishes to proceed.  Gerhard Munch.  EP Attending  Patient seen and examined. Agree with above. Since his prior clinic visit, no change. We will plan to proceed with EP study and catheter ablation of atrial flutter.  Leonia Reeves.D.

## 2018-04-07 NOTE — Progress Notes (Addendum)
Site area: rt groin fv sheaths x2 Site Prior to Removal:  Level 0 Pressure Applied For: 20 minutes Manual:   yes Patient Status During Pull:  stable Post Pull Site:  Level 0 Post Pull Instructions Given:  yes Post Pull Pulses Present: rt dp palpable Dressing Applied:  Gauze and tegaderm Bedrest begins @ 1430 Comments: IV saline locked

## 2018-04-07 NOTE — Progress Notes (Signed)
Client up and walked and tolerated well; right groin stable no bleeding or hematoma 

## 2018-04-07 NOTE — Progress Notes (Signed)
Dr Ladona Ridgel notified of B/P and order noted and per Dr Ladona Ridgel client to take coreg from home

## 2018-04-07 NOTE — Discharge Instructions (Signed)
Femoral Site Care °Refer to this sheet in the next few weeks. These instructions provide you with information about caring for yourself after your procedure. Your health care provider may also give you more specific instructions. Your treatment has been planned according to current medical practices, but problems sometimes occur. Call your health care provider if you have any problems or questions after your procedure. °What can I expect after the procedure? °After your procedure, it is typical to have the following: °· Bruising at the site that usually fades within 1-2 weeks. °· Blood collecting in the tissue (hematoma) that may be painful to the touch. It should usually decrease in size and tenderness within 1-2 weeks. ° °Follow these instructions at home: °· Take medicines only as directed by your health care provider. °· You may shower 24-48 hours after the procedure or as directed by your health care provider. Remove the bandage (dressing) and gently wash the site with plain soap and water. Pat the area dry with a clean towel. Do not rub the site, because this may cause bleeding. °· Do not take baths, swim, or use a hot tub until your health care provider approves. °· Check your insertion site every day for redness, swelling, or drainage. °· Do not apply powder or lotion to the site. °· Limit use of stairs to twice a day for the first 2-3 days or as directed by your health care provider. °· Do not squat for the first 2-3 days or as directed by your health care provider. °· Do not lift over 10 lb (4.5 kg) for 5 days after your procedure or as directed by your health care provider. °· Ask your health care provider when it is okay to: °? Return to work or school. °? Resume usual physical activities or sports. °? Resume sexual activity. °· Do not drive home if you are discharged the same day as the procedure. Have someone else drive you. °· You may drive 24 hours after the procedure unless otherwise instructed by  your health care provider. °· Do not operate machinery or power tools for 24 hours after the procedure or as directed by your health care provider. °· If your procedure was done as an outpatient procedure, which means that you went home the same day as your procedure, a responsible adult should be with you for the first 24 hours after you arrive home. °· Keep all follow-up visits as directed by your health care provider. This is important. °Contact a health care provider if: °· You have a fever. °· You have chills. °· You have increased bleeding from the site. Hold pressure on the site. °Get help right away if: °· You have unusual pain at the site. °· You have redness, warmth, or swelling at the site. °· You have drainage (other than a small amount of blood on the dressing) from the site. °· The site is bleeding, and the bleeding does not stop after 30 minutes of holding steady pressure on the site. °· Your leg or foot becomes pale, cool, tingly, or numb. °This information is not intended to replace advice given to you by your health care provider. Make sure you discuss any questions you have with your health care provider. °Document Released: 04/30/2014 Document Revised: 02/02/2016 Document Reviewed: 03/16/2014 °Elsevier Interactive Patient Education © 2018 Elsevier Inc[employer] ° ° °Moderate Conscious Sedation, Adult, Care After °These instructions provide you with information about caring for yourself after your procedure. Your health care provider may also give   you more specific instructions. Your treatment has been planned according to current medical practices, but problems sometimes occur. Call your health care provider if you have any problems or questions after your procedure. What can I expect after the procedure? After your procedure, it is common:  To feel sleepy for several hours.  To feel clumsy and have poor balance for several hours.  To have poor judgment for several hours.  To vomit if you eat  too soon.  Follow these instructions at home: For at least 24 hours after the procedure:   Do not: ? Participate in activities where you could fall or become injured. ? Drive. ? Use heavy machinery. ? Drink alcohol. ? Take sleeping pills or medicines that cause drowsiness. ? Make important decisions or sign legal documents. ? Take care of children on your own.  Rest. Eating and drinking  Follow the diet recommended by your health care provider.  If you vomit: ? Drink water, juice, or soup when you can drink without vomiting. ? Make sure you have little or no nausea before eating solid foods. General instructions  Have a responsible adult stay with you until you are awake and alert.  Take over-the-counter and prescription medicines only as told by your health care provider.  If you smoke, do not smoke without supervision.  Keep all follow-up visits as told by your health care provider. This is important. Contact a health care provider if:  You keep feeling nauseous or you keep vomiting.  You feel light-headed.  You develop a rash.  You have a fever. Get help right away if:  You have trouble breathing. This information is not intended to replace advice given to you by your health care provider. Make sure you discuss any questions you have with your health care provider. Document Released: 06/17/2013 Document Revised: 01/30/2016 Document Reviewed: 12/17/2015 Elsevier Interactive Patient Education  2018 [employer].    Post procedure care instructions No driving for 4 days. No lifting over 5 lbs for 1 week. No vigorous or sexual activity for 1 week. You may return to work on 04/14/18. Keep procedure site clean & dry. If you notice increased pain, swelling, bleeding or pus, call/return!  You may shower, but no soaking baths/hot tubs/pools for 1 week.

## 2018-04-08 ENCOUNTER — Encounter (HOSPITAL_COMMUNITY): Payer: Self-pay | Admitting: Internal Medicine

## 2018-04-08 MED FILL — Fentanyl Citrate Preservative Free (PF) Inj 100 MCG/2ML: INTRAMUSCULAR | Qty: 2 | Status: AC

## 2018-05-05 ENCOUNTER — Ambulatory Visit (INDEPENDENT_AMBULATORY_CARE_PROVIDER_SITE_OTHER): Payer: Federal, State, Local not specified - PPO | Admitting: Internal Medicine

## 2018-05-05 ENCOUNTER — Encounter: Payer: Self-pay | Admitting: Internal Medicine

## 2018-05-05 VITALS — BP 146/92 | HR 63 | Ht 70.5 in | Wt 226.2 lb

## 2018-05-05 DIAGNOSIS — I10 Essential (primary) hypertension: Secondary | ICD-10-CM | POA: Diagnosis not present

## 2018-05-05 DIAGNOSIS — Z950 Presence of cardiac pacemaker: Secondary | ICD-10-CM | POA: Diagnosis not present

## 2018-05-05 DIAGNOSIS — I5022 Chronic systolic (congestive) heart failure: Secondary | ICD-10-CM

## 2018-05-05 DIAGNOSIS — I4892 Unspecified atrial flutter: Secondary | ICD-10-CM

## 2018-05-05 DIAGNOSIS — I442 Atrioventricular block, complete: Secondary | ICD-10-CM

## 2018-05-05 MED ORDER — NIACIN ER (ANTIHYPERLIPIDEMIC) 500 MG PO TBCR: 500.0000 mg | EXTENDED_RELEASE_TABLET | Freq: Every day | ORAL | 3 refills | Status: DC

## 2018-05-05 MED ORDER — APIXABAN 5 MG PO TABS: 5.0000 mg | ORAL_TABLET | Freq: Two times a day (BID) | ORAL | 3 refills | Status: DC

## 2018-05-05 MED ORDER — LOSARTAN POTASSIUM 50 MG PO TABS: 50.0000 mg | ORAL_TABLET | Freq: Every day | ORAL | 3 refills | Status: DC

## 2018-05-05 MED ORDER — SIMVASTATIN 80 MG PO TABS: 80.0000 mg | ORAL_TABLET | Freq: Every day | ORAL | 3 refills | Status: DC

## 2018-05-05 MED ORDER — CARVEDILOL 12.5 MG PO TABS: 12.5000 mg | ORAL_TABLET | Freq: Two times a day (BID) | ORAL | 3 refills | Status: DC

## 2018-05-05 NOTE — Progress Notes (Signed)
HPI [name] returns today for evaluation of atrial flutter. He is a pleasant 67 yo man with a h/o chronic systolic heart failure, s/p biv PPM insertion, who has developed symptomatic atrial flutter. He denies chest pain but has felt some palpitations. He has not had syncope. His dyspnea has worsened some. He has a h/o MV repair and improvement of his LV function after undergoing biv upgrade. He has not had syncope. Minimal edema. No Known Allergies   Current Outpatient Medications  Medication Sig Dispense Refill  . apixaban (ELIQUIS) 5 MG TABS tablet Take 1 tablet (5 mg total) 2 (two) times daily by mouth. 180 tablet 3  . aspirin EC 81 MG tablet Take 1 tablet (81 mg total) by mouth daily.    . carvedilol (COREG) 12.5 MG tablet Take 1 tablet (12.5 mg total) by mouth 2 (two) times daily with a meal. 60 tablet 11  . cholecalciferol (VITAMIN D) 1000 units tablet Take 2,000 Units by mouth daily.    [name] losartan (COZAAR) 50 MG tablet Take 1 tablet (50 mg total) by mouth daily. 90 tablet 3  . Multiple Vitamin (MULTIVITAMIN WITH MINERALS) TABS Take 1 tablet by mouth daily.     . niacin (NIASPAN) 500 MG CR tablet Take 1 tablet (500 mg total) by mouth daily. 90 tablet 3  . simvastatin (ZOCOR) 80 MG tablet Take 1 tablet (80 mg total) by mouth at bedtime. 90 tablet 3   No current facility-administered medications for this visit.      Past Medical History:  Diagnosis Date  . Cardiomyopathy (HCC) 1.10.14   reduced EF 30-35%  . CHB (complete heart block) (HCC)    a. Pacer initially placed ~2002.   [name] CHF (congestive heart failure) (HCC)    a. NICM EF 35% - no CAD by cath 09/2012. b. s/p upgrade to Bi-V pacemaker 11/2012 (did not get ICD as Dr. Ladona Ridgel expects his EF will improve with bi-V pacing).  [name] Dyspnea    2d Echo, EF 40-45%, 5.20.14  . Hyperlipidemia   . Hypertension    pt denies HTN  . LBBB (left bundle branch block)    Myoview, LV enlarged  . Mitral valve regurgitation   .  Pacemaker    st jude    [name]  . RBBB   . S/P mitral valve repair 04/29/2013   28mm Sorin Memo 3D ring annuloplasty via right mini thoracotomy  . S/P tricuspid valve repair 04/29/2013   28 mm Edwards mc3 ring annuloplasty via right mini thoracotomy    ROS:   All systems reviewed and negative except as noted in the HPI.   Past Surgical History:  Procedure Laterality Date  . A-FLUTTER ABLATION N/A 04/07/2018   Procedure: A-FLUTTER ABLATION;  Surgeon: [name], MD;  Location: [facility] INVASIVE CV LAB;  Service: Cardiovascular;  Laterality: N/A;  . BI-VENTRICULAR PACEMAKER UPGRADE N/A 11/14/2012   Procedure: BI-VENTRICULAR PACEMAKER UPGRADE;  Surgeon: [name], MD;  Location: [facility] CATH LAB;  Service: Cardiovascular;  Laterality: N/A;  . CARDIAC CATHETERIZATION     clean  . INTRAOPERATIVE TRANSESOPHAGEAL ECHOCARDIOGRAM N/A 04/29/2013   Procedure: INTRAOPERATIVE TRANSESOPHAGEAL ECHOCARDIOGRAM;  Surgeon: [name], MD;  Location: [facility] OR;  Service: Open Heart Surgery;  Laterality: N/A;  . LEFT AND RIGHT HEART CATHETERIZATION WITH CORONARY ANGIOGRAM N/A 09/19/2012   Procedure: LEFT AND RIGHT HEART CATHETERIZATION WITH CORONARY ANGIOGRAM;  Surgeon: [name], MD;  Location: [facility] CATH LAB;  Service: Cardiovascular;  Laterality: N/A;  . MINIMALLY INVASIVE TRICUSPID VALVE REPAIR Right 04/29/2013   Procedure: MINIMALLY INVASIVE TRICUSPID VALVE REPAIR;  Surgeon: [name], MD;  Location: [facility] OR;  Service: Open Heart Surgery;  Laterality: Right;  . MITRAL VALVE REPAIR Right 04/29/2013   Procedure: MINIMALLY INVASIVE MITRAL VALVE REPAIR (MVR);  Surgeon: [name], MD;  Location: [facility] OR;  Service: Open Heart Surgery;  Laterality: Right;  . MULTIPLE EXTRACTIONS WITH ALVEOLOPLASTY N/A 03/20/2013   Procedure: Extraction of tooth #'s 1,6,7,8,9,10,16,17,20,21,22,23,24,25,26,27,28, 29 with alveoloplasty;  Surgeon: [name], DDS;  Location: [facility] OR;  Service: Oral Surgery;   Laterality: N/A;  . PACEMAKER PLACEMENT    . TEE WITHOUT CARDIOVERSION N/A 04/09/2013   Procedure: TRANSESOPHAGEAL ECHOCARDIOGRAM (TEE);  Surgeon: Dolores Patty, MD;  Location: [facility] ENDOSCOPY;  Service: Cardiovascular;  Laterality: N/A;     History reviewed. No pertinent family history.   Social History   Socioeconomic History  . Marital status: Married    Spouse name: Not on file  . Number of children: 1  . Years of education: Not on file  . Highest education level: Not on file  Occupational History    Employer: [employer]  Social Needs  . Financial resource strain: Not on file  . Food insecurity:    Worry: Not on file    Inability: Not on file  . Transportation needs:    Medical: Not on file    Non-medical: Not on file  Tobacco Use  . Smoking status: Former Games developer  . Smokeless tobacco: Never Used  Substance and Sexual Activity  . Alcohol use: No  . Drug use: No  . Sexual activity: Yes  Lifestyle  . Physical activity:    Days per week: Not on file    Minutes per session: Not on file  . Stress: Not on file  Relationships  . Social connections:    Talks on phone: Not on file    Gets together: Not on file    Attends religious service: Not on file    Active member of club or organization: Not on file    Attends meetings of clubs or organizations: Not on file    Relationship status: Not on file  . Intimate partner violence:    Fear of current or ex partner: Not on file    Emotionally abused: Not on file    Physically abused: Not on file    Forced sexual activity: Not on file  Other Topics Concern  . Not on file  Social History Narrative   Russell Martin is married. Russell Martin has one grown son that lives in [place].   Russell Martin with a history of smoking but quit approximately 10 years ago. Russell Martin denies use of alcohol or other illicit drugs. Russell Martin has never used smokeless tobacco.     BP (!) 146/92   Pulse 63   Ht 5' 10.5" (1.791 m)   Wt 226 lb 3.2 oz (102.6 kg)    SpO2 97%   BMI 32.00 kg/m   Physical Exam:  Well appearing 67 yo man, NAD HEENT: Unremarkable Neck:  7 cm JVD, no thyromegally Lymphatics:  No adenopathy Back:  No CVA tenderness Lungs:  Clear HEART:  Regular rate rhythm, no murmurs, no rubs, no clicks Abd:  soft, positive bowel sounds, no organomegally, no rebound, no guarding Ext:  2 plus pulses, no edema, no cyanosis, no clubbing Skin:  No rashes no nodules Neuro:  CN II through XII intact, motor grossly intact  EKG - nsr with biv pacing  DEVICE  Normal device function.  See PaceArt for details.   Assess/Plan: 1. Chronic systolic heart failure - he is class 2. He will continue his current meds. 2. PAF - he is maintaining NSR. He will continue Eliquis. 3. CHB - he is asymptomatic. No change.  Russell Martin.D.

## 2018-05-05 NOTE — Patient Instructions (Addendum)
Medication Instructions:  Continue your current medications.  Labwork: None ordered.  Testing/Procedures: None ordered.  Follow-Up: Your physician wants you to follow-up in: one year with Dr. Ladona Ridgel.   You will receive a reminder letter in the mail two months in advance. If you don't receive a letter, please call our office to schedule the follow-up appointment.  Remote monitoring is used to monitor your Pacemaker from home. This monitoring reduces the number of office visits required to check your device to one time per year. It allows [place] to keep an eye on the functioning of your device to ensure it is working properly. You are scheduled for a device check from home on 05/06/2018. You may send your transmission at any time that day. If you have a wireless device, the transmission will be sent automatically. After your physician reviews your transmission, you will receive a postcard with your next transmission date.  Any Other Special Instructions Will Be Listed Below (If Applicable).  If you need a refill on your cardiac medications before your next appointment, please call your pharmacy.

## 2018-05-06 ENCOUNTER — Ambulatory Visit (INDEPENDENT_AMBULATORY_CARE_PROVIDER_SITE_OTHER): Payer: Federal, State, Local not specified - PPO | Admitting: *Deleted

## 2018-05-06 DIAGNOSIS — I442 Atrioventricular block, complete: Secondary | ICD-10-CM

## 2018-05-06 NOTE — Progress Notes (Signed)
Remote pacemaker transmission.   

## 2018-05-08 ENCOUNTER — Encounter: Payer: Self-pay | Admitting: Cardiology

## 2018-05-13 LAB — CUP PACEART INCLINIC DEVICE CHECK
Battery Remaining Longevity: 50 mo
Brady Statistic RV Percent Paced: 99.97 %
Date Time Interrogation Session: 20190826203044
Implantable Lead Implant Date: 20010807
Implantable Lead Location: 753858
Implantable Lead Location: 753859
Implantable Lead Location: 753860
Implantable Pulse Generator Implant Date: 20140307
Lead Channel Impedance Value: 650 Ohm
Lead Channel Pacing Threshold Amplitude: 0.75 V
Lead Channel Pacing Threshold Amplitude: 1 V
Lead Channel Pacing Threshold Pulse Width: 0.4 ms
Lead Channel Sensing Intrinsic Amplitude: 12 mV
Lead Channel Setting Pacing Amplitude: 2 V
Lead Channel Setting Pacing Amplitude: 2 V
Lead Channel Setting Pacing Pulse Width: 0.4 ms
Lead Channel Setting Pacing Pulse Width: 0.5 ms
MDC IDC LEAD IMPLANT DT: 20010807
MDC IDC LEAD IMPLANT DT: 20140307
MDC IDC MSMT BATTERY VOLTAGE: 2.87 V
MDC IDC MSMT LEADCHNL LV PACING THRESHOLD PULSEWIDTH: 0.5 ms
MDC IDC MSMT LEADCHNL RA IMPEDANCE VALUE: 387.5 Ohm
MDC IDC MSMT LEADCHNL RA PACING THRESHOLD AMPLITUDE: 0.5 V
MDC IDC MSMT LEADCHNL RA PACING THRESHOLD PULSEWIDTH: 0.4 ms
MDC IDC MSMT LEADCHNL RA SENSING INTR AMPL: 3.5 mV
MDC IDC MSMT LEADCHNL RV IMPEDANCE VALUE: 275 Ohm
MDC IDC SET LEADCHNL RA PACING AMPLITUDE: 2 V
MDC IDC SET LEADCHNL RV SENSING SENSITIVITY: 5 mV
MDC IDC STAT BRADY RA PERCENT PACED: 17 %
Pulse Gen Model: 3210
Pulse Gen Serial Number: 2882239

## 2018-05-14 ENCOUNTER — Encounter: Payer: Self-pay | Admitting: Internal Medicine

## 2018-05-23 LAB — CUP PACEART INCLINIC DEVICE CHECK
Battery Remaining Longevity: 49 mo
Battery Voltage: 2.87 V
Date Time Interrogation Session: 20190628183242
Implantable Lead Implant Date: 20010807
Implantable Lead Implant Date: 20140307
Implantable Lead Location: 753858
Implantable Pulse Generator Implant Date: 20140307
Lead Channel Pacing Threshold Amplitude: 0.75 V
Lead Channel Pacing Threshold Amplitude: 1 V
Lead Channel Pacing Threshold Pulse Width: 0.4 ms
Lead Channel Pacing Threshold Pulse Width: 0.5 ms
Lead Channel Setting Pacing Amplitude: 2 V
Lead Channel Setting Pacing Amplitude: 2 V
Lead Channel Setting Pacing Amplitude: 2 V
Lead Channel Setting Pacing Pulse Width: 0.4 ms
Lead Channel Setting Pacing Pulse Width: 0.5 ms
MDC IDC LEAD IMPLANT DT: 20010807
MDC IDC LEAD LOCATION: 753859
MDC IDC LEAD LOCATION: 753860
MDC IDC MSMT LEADCHNL LV IMPEDANCE VALUE: 637.5 Ohm
MDC IDC MSMT LEADCHNL RA IMPEDANCE VALUE: 375 Ohm
MDC IDC MSMT LEADCHNL RA SENSING INTR AMPL: 4.6 mV
MDC IDC MSMT LEADCHNL RV IMPEDANCE VALUE: 287.5 Ohm
MDC IDC MSMT LEADCHNL RV SENSING INTR AMPL: 12 mV
MDC IDC PG SERIAL: 2882239
MDC IDC SET LEADCHNL RV SENSING SENSITIVITY: 5 mV
MDC IDC STAT BRADY RA PERCENT PACED: 20 %
MDC IDC STAT BRADY RV PERCENT PACED: 99.96 %

## 2018-06-09 LAB — CUP PACEART REMOTE DEVICE CHECK
Battery Remaining Percentage: 51 %
Battery Voltage: 2.87 V
Brady Statistic AS VP Percent: 65 %
Implantable Lead Implant Date: 20010807
Implantable Lead Implant Date: 20140307
Implantable Lead Location: 753860
Implantable Pulse Generator Implant Date: 20140307
Lead Channel Impedance Value: 290 Ohm
Lead Channel Pacing Threshold Amplitude: 0.625 V
Lead Channel Pacing Threshold Amplitude: 0.75 V
Lead Channel Pacing Threshold Amplitude: 0.875 V
Lead Channel Pacing Threshold Pulse Width: 0.4 ms
Lead Channel Pacing Threshold Pulse Width: 0.5 ms
Lead Channel Sensing Intrinsic Amplitude: 4.4 mV
Lead Channel Setting Pacing Amplitude: 2 V
Lead Channel Setting Pacing Amplitude: 2 V
Lead Channel Setting Pacing Pulse Width: 0.5 ms
MDC IDC LEAD IMPLANT DT: 20010807
MDC IDC LEAD LOCATION: 753858
MDC IDC LEAD LOCATION: 753859
MDC IDC MSMT BATTERY REMAINING LONGEVITY: 50 mo
MDC IDC MSMT LEADCHNL LV IMPEDANCE VALUE: 650 Ohm
MDC IDC MSMT LEADCHNL RA IMPEDANCE VALUE: 380 Ohm
MDC IDC MSMT LEADCHNL RA PACING THRESHOLD PULSEWIDTH: 0.4 ms
MDC IDC MSMT LEADCHNL RV SENSING INTR AMPL: 12 mV
MDC IDC PG SERIAL: 2882239
MDC IDC SESS DTM: 20190826011557
MDC IDC SET LEADCHNL LV PACING AMPLITUDE: 2 V
MDC IDC SET LEADCHNL RV PACING PULSEWIDTH: 0.4 ms
MDC IDC SET LEADCHNL RV SENSING SENSITIVITY: 5 mV
MDC IDC STAT BRADY AP VP PERCENT: 35 %
MDC IDC STAT BRADY AP VS PERCENT: 1 %
MDC IDC STAT BRADY AS VS PERCENT: 1 %
MDC IDC STAT BRADY RA PERCENT PACED: 17 %

## 2018-07-15 DIAGNOSIS — Z7901 Long term (current) use of anticoagulants: Secondary | ICD-10-CM | POA: Diagnosis not present

## 2018-07-15 DIAGNOSIS — I10 Essential (primary) hypertension: Secondary | ICD-10-CM | POA: Diagnosis not present

## 2018-07-15 DIAGNOSIS — Z23 Encounter for immunization: Secondary | ICD-10-CM | POA: Diagnosis not present

## 2018-07-15 DIAGNOSIS — N183 Chronic kidney disease, stage 3 (moderate): Secondary | ICD-10-CM | POA: Diagnosis not present

## 2018-07-15 DIAGNOSIS — E7800 Pure hypercholesterolemia, unspecified: Secondary | ICD-10-CM | POA: Diagnosis not present

## 2018-07-15 DIAGNOSIS — I429 Cardiomyopathy, unspecified: Secondary | ICD-10-CM | POA: Diagnosis not present

## 2018-07-15 DIAGNOSIS — Z950 Presence of cardiac pacemaker: Secondary | ICD-10-CM | POA: Diagnosis not present

## 2018-07-15 DIAGNOSIS — R972 Elevated prostate specific antigen [PSA]: Secondary | ICD-10-CM | POA: Diagnosis not present

## 2018-07-18 DIAGNOSIS — R972 Elevated prostate specific antigen [PSA]: Secondary | ICD-10-CM | POA: Diagnosis not present

## 2018-07-29 DIAGNOSIS — R972 Elevated prostate specific antigen [PSA]: Secondary | ICD-10-CM | POA: Diagnosis not present

## 2018-07-29 DIAGNOSIS — N400 Enlarged prostate without lower urinary tract symptoms: Secondary | ICD-10-CM | POA: Diagnosis not present

## 2018-08-05 ENCOUNTER — Ambulatory Visit (INDEPENDENT_AMBULATORY_CARE_PROVIDER_SITE_OTHER): Payer: Medicare Other

## 2018-08-05 DIAGNOSIS — I5022 Chronic systolic (congestive) heart failure: Secondary | ICD-10-CM

## 2018-08-05 DIAGNOSIS — I442 Atrioventricular block, complete: Secondary | ICD-10-CM | POA: Diagnosis not present

## 2018-08-05 NOTE — Progress Notes (Signed)
Remote pacemaker transmission.   

## 2018-08-22 ENCOUNTER — Other Ambulatory Visit: Payer: Self-pay | Admitting: Internal Medicine

## 2018-09-08 DIAGNOSIS — J22 Unspecified acute lower respiratory infection: Secondary | ICD-10-CM | POA: Diagnosis not present

## 2018-09-08 DIAGNOSIS — R509 Fever, unspecified: Secondary | ICD-10-CM | POA: Diagnosis not present

## 2018-09-12 DIAGNOSIS — J069 Acute upper respiratory infection, unspecified: Secondary | ICD-10-CM | POA: Diagnosis not present

## 2018-09-12 DIAGNOSIS — J209 Acute bronchitis, unspecified: Secondary | ICD-10-CM | POA: Diagnosis not present

## 2018-09-15 DIAGNOSIS — J209 Acute bronchitis, unspecified: Secondary | ICD-10-CM | POA: Diagnosis not present

## 2018-09-26 LAB — CUP PACEART REMOTE DEVICE CHECK
Battery Remaining Percentage: 46 %
Brady Statistic AP VP Percent: 44 %
Brady Statistic AS VP Percent: 56 %
Brady Statistic AS VS Percent: 1 %
Implantable Lead Implant Date: 20010807
Implantable Lead Implant Date: 20010807
Implantable Lead Implant Date: 20140307
Implantable Lead Location: 753860
Lead Channel Pacing Threshold Amplitude: 0.75 V
Lead Channel Pacing Threshold Pulse Width: 0.4 ms
Lead Channel Sensing Intrinsic Amplitude: 3.1 mV
Lead Channel Setting Pacing Amplitude: 2 V
Lead Channel Setting Pacing Amplitude: 2 V
Lead Channel Setting Pacing Amplitude: 2 V
Lead Channel Setting Pacing Pulse Width: 0.5 ms
MDC IDC LEAD LOCATION: 753858
MDC IDC LEAD LOCATION: 753859
MDC IDC MSMT BATTERY REMAINING LONGEVITY: 43 mo
MDC IDC MSMT BATTERY VOLTAGE: 2.86 V
MDC IDC MSMT LEADCHNL LV IMPEDANCE VALUE: 650 Ohm
MDC IDC MSMT LEADCHNL LV PACING THRESHOLD AMPLITUDE: 0.75 V
MDC IDC MSMT LEADCHNL LV PACING THRESHOLD PULSEWIDTH: 0.5 ms
MDC IDC MSMT LEADCHNL RA IMPEDANCE VALUE: 380 Ohm
MDC IDC MSMT LEADCHNL RA PACING THRESHOLD AMPLITUDE: 0.5 V
MDC IDC MSMT LEADCHNL RA PACING THRESHOLD PULSEWIDTH: 0.4 ms
MDC IDC MSMT LEADCHNL RV IMPEDANCE VALUE: 280 Ohm
MDC IDC MSMT LEADCHNL RV SENSING INTR AMPL: 12 mV
MDC IDC PG IMPLANT DT: 20140307
MDC IDC SESS DTM: 20191126074920
MDC IDC SET LEADCHNL RV PACING PULSEWIDTH: 0.4 ms
MDC IDC SET LEADCHNL RV SENSING SENSITIVITY: 5 mV
MDC IDC STAT BRADY AP VS PERCENT: 1 %
MDC IDC STAT BRADY RA PERCENT PACED: 43 %
Pulse Gen Serial Number: 2882239

## 2018-11-04 ENCOUNTER — Ambulatory Visit (INDEPENDENT_AMBULATORY_CARE_PROVIDER_SITE_OTHER): Payer: Medicare Other | Admitting: *Deleted

## 2018-11-04 DIAGNOSIS — I442 Atrioventricular block, complete: Secondary | ICD-10-CM

## 2018-11-05 LAB — CUP PACEART REMOTE DEVICE CHECK
Battery Remaining Longevity: 38 mo
Battery Remaining Percentage: 40 %
Battery Voltage: 2.84 V
Brady Statistic AP VP Percent: 33 %
Brady Statistic AS VS Percent: 1 %
Brady Statistic RA Percent Paced: 32 %
Date Time Interrogation Session: 20200225082239
Implantable Lead Implant Date: 20010807
Implantable Lead Location: 753860
Lead Channel Impedance Value: 360 Ohm
Lead Channel Pacing Threshold Amplitude: 0.5 V
Lead Channel Pacing Threshold Amplitude: 0.75 V
Lead Channel Pacing Threshold Amplitude: 0.875 V
Lead Channel Pacing Threshold Pulse Width: 0.4 ms
Lead Channel Sensing Intrinsic Amplitude: 3.4 mV
Lead Channel Setting Pacing Amplitude: 2 V
Lead Channel Setting Pacing Amplitude: 2 V
Lead Channel Setting Pacing Pulse Width: 0.4 ms
Lead Channel Setting Pacing Pulse Width: 0.5 ms
Lead Channel Setting Sensing Sensitivity: 5 mV
MDC IDC LEAD IMPLANT DT: 20010807
MDC IDC LEAD IMPLANT DT: 20140307
MDC IDC LEAD LOCATION: 753858
MDC IDC LEAD LOCATION: 753859
MDC IDC MSMT LEADCHNL LV IMPEDANCE VALUE: 640 Ohm
MDC IDC MSMT LEADCHNL LV PACING THRESHOLD PULSEWIDTH: 0.5 ms
MDC IDC MSMT LEADCHNL RV IMPEDANCE VALUE: 280 Ohm
MDC IDC MSMT LEADCHNL RV PACING THRESHOLD PULSEWIDTH: 0.4 ms
MDC IDC MSMT LEADCHNL RV SENSING INTR AMPL: 10.8 mV
MDC IDC PG IMPLANT DT: 20140307
MDC IDC SET LEADCHNL RA PACING AMPLITUDE: 2 V
MDC IDC STAT BRADY AP VS PERCENT: 1 %
MDC IDC STAT BRADY AS VP PERCENT: 67 %
Pulse Gen Model: 3210
Pulse Gen Serial Number: 2882239

## 2018-11-12 ENCOUNTER — Encounter: Payer: Self-pay | Admitting: Cardiology

## 2018-11-12 NOTE — Progress Notes (Signed)
Remote pacemaker transmission.   

## 2018-12-14 ENCOUNTER — Other Ambulatory Visit: Payer: Self-pay | Admitting: Internal Medicine

## 2019-01-06 DIAGNOSIS — R972 Elevated prostate specific antigen [PSA]: Secondary | ICD-10-CM | POA: Diagnosis not present

## 2019-02-03 ENCOUNTER — Ambulatory Visit (INDEPENDENT_AMBULATORY_CARE_PROVIDER_SITE_OTHER): Payer: Medicare Other | Admitting: *Deleted

## 2019-02-03 ENCOUNTER — Other Ambulatory Visit: Payer: Self-pay

## 2019-02-03 DIAGNOSIS — I442 Atrioventricular block, complete: Secondary | ICD-10-CM | POA: Diagnosis not present

## 2019-02-03 DIAGNOSIS — I5022 Chronic systolic (congestive) heart failure: Secondary | ICD-10-CM

## 2019-02-03 LAB — CUP PACEART REMOTE DEVICE CHECK
Date Time Interrogation Session: 20200526142020
Implantable Lead Implant Date: 20010807
Implantable Lead Implant Date: 20010807
Implantable Lead Implant Date: 20140307
Implantable Lead Location: 753858
Implantable Lead Location: 753859
Implantable Lead Location: 753860
Implantable Pulse Generator Implant Date: 20140307
Pulse Gen Model: 3210
Pulse Gen Serial Number: 2882239

## 2019-02-12 NOTE — Progress Notes (Signed)
Remote pacemaker transmission.   

## 2019-03-10 ENCOUNTER — Ambulatory Visit (HOSPITAL_COMMUNITY): Payer: Self-pay | Admitting: Dentistry

## 2019-03-10 ENCOUNTER — Encounter (HOSPITAL_COMMUNITY): Payer: Self-pay | Admitting: Dentistry

## 2019-03-10 ENCOUNTER — Other Ambulatory Visit: Payer: Self-pay

## 2019-03-10 VITALS — BP 179/79 | HR 59 | Temp 98.4°F

## 2019-03-10 DIAGNOSIS — Z952 Presence of prosthetic heart valve: Secondary | ICD-10-CM | POA: Diagnosis not present

## 2019-03-10 DIAGNOSIS — Z972 Presence of dental prosthetic device (complete) (partial): Secondary | ICD-10-CM

## 2019-03-10 DIAGNOSIS — K08109 Complete loss of teeth, unspecified cause, unspecified class: Secondary | ICD-10-CM

## 2019-03-10 DIAGNOSIS — Z463 Encounter for fitting and adjustment of dental prosthetic device: Secondary | ICD-10-CM

## 2019-03-10 DIAGNOSIS — Z9889 Other specified postprocedural states: Secondary | ICD-10-CM

## 2019-03-10 DIAGNOSIS — I350 Nonrheumatic aortic (valve) stenosis: Secondary | ICD-10-CM | POA: Diagnosis not present

## 2019-03-10 NOTE — Progress Notes (Signed)
PROGRESS NOTE:  03/10/2019  Patient:            Russell Martin Date of Birth:  [birth year] MRN:                [id number]   COVID 19 SCREENING: The patient does not symptoms concerning for COVID-19 infection (Including fever, chills, cough, or new SHORTNESS OF BREATH).   BP (!) 179/79 (BP Location: Left Arm)   Pulse (!) 59   Temp 98.4 F (36.9 C)    Patient denies having any symptoms associated with the elevated blood pressure. Patient denies dizziness, headache, or other adverse effect. The patient indicates that he usually takes his blood pressure medicine at 11 AM. We discussed follow-up with his cardiologist for evaluation of his blood pressure. Patient refused this time.   Russell Martin is a 68 year old male that presents for periodic oral exam and evaluation of upper and lower complete dentures. Patient was LAST evaluated by his cardiologist, Dr. [name], on 05/05/18.    Patient Active Problem List   Diagnosis Date Noted  . Typical atrial flutter (HCC) 04/07/2018  . S/P mitral valve repair 04/29/2013  . S/P tricuspid valve repair 04/29/2013  . MR (mitral regurgitation) 03/11/2013  . Tricuspid valve regurgitation 03/11/2013  . Mitral regurgitation 03/03/2013  . Chronic systolic heart failure (HCC) 10/21/2012  . Complete heart block (HCC) 10/21/2012  . Essential hypertension 10/21/2012  . Pacemaker 10/21/2012     Medical Hx Update:  Past Medical History:  Diagnosis Date  . Cardiomyopathy (HCC) 1.10.14   reduced EF 30-35%  . CHB (complete heart block) (HCC)    a. Pacer initially placed ~2002.   [name] CHF (congestive heart failure) (HCC)    a. NICM EF 35% - no CAD by cath 09/2012. b. s/p upgrade to Bi-V pacemaker 11/2012 (did not get ICD as Dr. Ladona Ridgel expects his EF will improve with bi-V pacing).  [name] Dyspnea    2d Echo, EF 40-45%, 5.20.14  . Hyperlipidemia   . Hypertension    pt denies HTN  . LBBB (left bundle branch block)    Myoview, LV enlarged  . Mitral valve  regurgitation   . Pacemaker    st jude    greg taylor  . RBBB   . S/P mitral valve repair 04/29/2013   57mm Sorin Memo 3D ring annuloplasty via right mini thoracotomy  . S/P tricuspid valve repair 04/29/2013   28 mm Edwards mc3 ring annuloplasty via right mini thoracotomy  .  ALLERGIES/ADVERSE DRUG REACTIONS: No Known Allergies  MEDICATIONS: Current Outpatient Medications  Medication Sig Dispense Refill  . aspirin EC 81 MG tablet Take 1 tablet (81 mg total) by mouth daily.    . carvedilol (COREG) 12.5 MG tablet Take 1 tablet (12.5 mg total) by mouth 2 (two) times daily with a meal. 180 tablet 3  . cholecalciferol (VITAMIN D) 1000 units tablet Take 2,000 Units by mouth daily.    [name] losartan (COZAAR) 50 MG tablet Take 1 tablet (50 mg total) by mouth daily. 90 tablet 3  . Multiple Vitamin (MULTIVITAMIN WITH MINERALS) TABS Take 1 tablet by mouth daily.     . niacin (NIASPAN) 500 MG CR tablet TAKE 1 TABLET DAILY 90 tablet 0  . simvastatin (ZOCOR) 80 MG tablet Take 1 tablet (80 mg total) by mouth at bedtime. 90 tablet 3  . apixaban (ELIQUIS) 5 MG TABS tablet Take 1 tablet (5 mg total) by mouth 2 (two) times daily. 180  tablet 3   No current facility-administered medications for this visit.      C/C: Patient presents for denture recall. Patient denies having any dental problems.  HPI:  Russell Martin was originally seen on 03/17/2013 as part of a pre-heart valve surgery dental protocol. Patient had remaining teeth extracted with alveoloplasty and pre-prosthetic surgery as indicated in the operating room on 03/20/2013. After adequate healing, the dentures were fabricated and inserted in on 06/18/2013. Patient has had multiple denture adjustment appointments since that time. The patient now presents for periodic oral examination and evaluation of the upper and lower complete dentures.  DENTAL EXAM: General: Patient is a tall, well-developed, well-nourished male in no acute distress. Vitals: BP  (!) 179/79 (BP Location: Left Arm)   Pulse (!) 59   Temp 98.4 F (36.9 C)  Extraoral Exam: There is no palpable lymphadenopathy. The patient denies acute TMJ symptoms. Intraoral  Exam: The patient has normal saliva. There is no evidence of denture irritation erythema or other soft tissue lesions. Dentition: The patient is edentulous. Prosthodontic: The patient has an upper and lower complete dentures. These are stable and retentive. Pressure indicating paste was applied to dentures and they were adjusted as needed minimally. The dentures were polished. Calculus on some of the posterior denture teeth was removed and patient was instructed on keeping dentures clean. Patient accepts results of the adjustment. Patient still indicates that the repair of the chip of the maxillary incisal is acceptable to him. "It looks more natura".  Patient refuses to have the dentures sent to the lab for repair of the maxillary anterior tooth. Occlusion:  The occlusion of the upper and lower complete dentures is acceptable.No adjustments were needed.  Assessments: 1. The patient is edentulous. 2. The patient has acceptable upper and lower complete dentures. 3. Elevated blood pressure noted at dental visit.  Plan:  1. Patient is to keep dentures out if sore spots arise. 2. Patient to use salt water rinses as needed to aid healing. 3. Patient to return to clinic as scheduled. Patient to call if problems arise before then. 4. Follow-up with Dr. Lovena Le for evaluation of blood pressure as indicated.  Russell Martin, DDS

## 2019-03-10 NOTE — Patient Instructions (Signed)
Plan:  1. Patient is to keep dentures out if sore spots arise. 2. Patient to use salt water rinses as needed to aid healing. 3. Patient to return to clinic as scheduled. Patient to call if problems arise before then. 4. Follow-up with Dr. Lovena Le for evaluation of blood pressure as indicated.  Russell Martin, DDS

## 2019-03-15 ENCOUNTER — Other Ambulatory Visit: Payer: Self-pay | Admitting: Internal Medicine

## 2019-03-20 DIAGNOSIS — N400 Enlarged prostate without lower urinary tract symptoms: Secondary | ICD-10-CM | POA: Diagnosis not present

## 2019-03-20 DIAGNOSIS — R972 Elevated prostate specific antigen [PSA]: Secondary | ICD-10-CM | POA: Diagnosis not present

## 2019-04-28 ENCOUNTER — Telehealth: Payer: Self-pay | Admitting: Internal Medicine

## 2019-04-28 NOTE — Telephone Encounter (Signed)
°*  STAT* If patient is at the pharmacy, call can be transferred to refill team.   1. Which medications need to be refilled? (please list name of each medication and dose if known) Eliquis 5 mg   2. Which pharmacy/location (including street and city if local pharmacy) is medication to be sent to? Mail in  3. Do they need a 30 day or 90 day supply? [place]

## 2019-05-06 ENCOUNTER — Ambulatory Visit (INDEPENDENT_AMBULATORY_CARE_PROVIDER_SITE_OTHER): Payer: Medicare Other | Admitting: *Deleted

## 2019-05-06 DIAGNOSIS — I442 Atrioventricular block, complete: Secondary | ICD-10-CM | POA: Diagnosis not present

## 2019-05-08 ENCOUNTER — Other Ambulatory Visit: Payer: Self-pay | Admitting: Internal Medicine

## 2019-05-11 NOTE — Progress Notes (Signed)
Cardiology Office Note Date:  05/12/2019  Patient ID:  Russell Martin, Russell Martin [birth year], MRN [id number] PCP:  Renford Dills, MD  Electrophysiologist:  Dr. Ladona Ridgel    Chief Complaint:  annual EP visit  History of Present Illness: Russell Martin is a 68 y.o. male with history of VHD w/MV and TV repairs (2014), NICM,, chronic CHF (systolic) has had subsequent improvement in LVEF, Aflutter (ablated 03/2018),  CHB w/ PPM >> CRT-P, HTN, HLD.  He comes today to be seen for Dr. Ladona Ridgel, last sen by him 04/2018, at that time doing well, no changes were made to his medicines or device programming.  He did mention PAF and to continue his OAC.  He is doing well.  Denies any CP, palpitations or cardiac awareness, no SOB opr DOE>  Denies any exertional intolerances.  No dizziness, near syncope or syncope. He is tolerating Eliquis, no bleeding or signs of bleeding , asks about stopping it given her had the ablation last year.   Device information SJM CRT-P, RA/RV leads 04/16/00, LV/generator 11/14/12 Dependent Known A lead noise and infrequent A lead impedance alerts   Past Medical History:  Diagnosis Date  . Cardiomyopathy (HCC) 1.10.14   reduced EF 30-35%  . CHB (complete heart block) (HCC)    a. Pacer initially placed ~2002.   [name] CHF (congestive heart failure) (HCC)    a. NICM EF 35% - no CAD by cath 09/2012. b. s/p upgrade to Bi-V pacemaker 11/2012 (did not get ICD as Dr. Ladona Ridgel expects his EF will improve with bi-V pacing).  [name] Dyspnea    2d Echo, EF 40-45%, 5.20.14  . Hyperlipidemia   . Hypertension    pt denies HTN  . LBBB (left bundle branch block)    Myoview, LV enlarged  . Mitral valve regurgitation   . Pacemaker    st jude    greg taylor  . RBBB   . S/P mitral valve repair 04/29/2013   74mm Sorin Memo 3D ring annuloplasty via right mini thoracotomy  . S/P tricuspid valve repair 04/29/2013   28 mm Edwards mc3 ring annuloplasty via right mini thoracotomy    Past Surgical History:   Procedure Laterality Date  . A-FLUTTER ABLATION N/A 04/07/2018   Procedure: A-FLUTTER ABLATION;  Surgeon: [name], MD;  Location: [facility] INVASIVE CV LAB;  Service: Cardiovascular;  Laterality: N/A;  . BI-VENTRICULAR PACEMAKER UPGRADE N/A 11/14/2012   Procedure: BI-VENTRICULAR PACEMAKER UPGRADE;  Surgeon: [name], MD;  Location: [facility] CATH LAB;  Service: Cardiovascular;  Laterality: N/A;  . CARDIAC CATHETERIZATION     clean  . INTRAOPERATIVE TRANSESOPHAGEAL ECHOCARDIOGRAM N/A 04/29/2013   Procedure: INTRAOPERATIVE TRANSESOPHAGEAL ECHOCARDIOGRAM;  Surgeon: [name], MD;  Location: [facility] OR;  Service: Open Heart Surgery;  Laterality: N/A;  . LEFT AND RIGHT HEART CATHETERIZATION WITH CORONARY ANGIOGRAM N/A 09/19/2012   Procedure: LEFT AND RIGHT HEART CATHETERIZATION WITH CORONARY ANGIOGRAM;  Surgeon: [name], MD;  Location: [facility] CATH LAB;  Service: Cardiovascular;  Laterality: N/A;  . MINIMALLY INVASIVE TRICUSPID VALVE REPAIR Right 04/29/2013   Procedure: MINIMALLY INVASIVE TRICUSPID VALVE REPAIR;  Surgeon: [name], MD;  Location: [facility] OR;  Service: Open Heart Surgery;  Laterality: Right;  . MITRAL VALVE REPAIR Right 04/29/2013   Procedure: MINIMALLY INVASIVE MITRAL VALVE REPAIR (MVR);  Surgeon: [name], MD;  Location: [facility] OR;  Service: Open Heart Surgery;  Laterality: Right;  . MULTIPLE EXTRACTIONS WITH ALVEOLOPLASTY N/A 03/20/2013   Procedure: Extraction of tooth #'  s 29,6-10,16,17, and 20-29 with alveoloplasty;  Surgeon: Lenn Cal, DDS;  Location: [place];  Service: Oral Surgery;  Laterality: N/A;  . PACEMAKER PLACEMENT    . TEE WITHOUT CARDIOVERSION N/A 04/09/2013   Procedure: TRANSESOPHAGEAL ECHOCARDIOGRAM (TEE);  Surgeon: Jolaine Artist, MD;  Location: [facility] ENDOSCOPY;  Service: Cardiovascular;  Laterality: N/A;    Current Outpatient Medications  Medication Sig Dispense Refill  . aspirin EC 81 MG tablet Take 1 tablet (81 mg total) by mouth daily.    [name] BIOTIN PO  Take by mouth daily.    . carvedilol (COREG) 12.5 MG tablet Take 1 tablet (12.5 mg total) by mouth 2 (two) times daily with a meal. 180 tablet 3  . cholecalciferol (VITAMIN D) 1000 units tablet Take 2,000 Units by mouth daily.    [name] ELIQUIS 5 MG TABS tablet TAKE 1 TABLET TWICE A DAY 180 tablet 0  . losartan (COZAAR) 50 MG tablet Take 1 tablet (50 mg total) by mouth daily. 90 tablet 3  . Multiple Vitamin (MULTIVITAMIN WITH MINERALS) TABS Take 1 tablet by mouth daily.     . niacin (NIASPAN) 500 MG CR tablet TAKE 1 TABLET DAILY 90 tablet 0  . simvastatin (ZOCOR) 80 MG tablet Take 1 tablet (80 mg total) by mouth at bedtime. 90 tablet 3   No current facility-administered medications for this visit.     Allergies:   Patient has no known allergies.   Social History:  The patient  reports that he has quit smoking. He has never used smokeless tobacco. He reports that he does not drink alcohol or use drugs.   Family History:  The patient's family history includes Diabetes in his brother; Heart disease in his brother; Hypertension in his brother and brother; Other in his father and mother.  ROS:  Please see the history of present illness.  All other systems are reviewed and otherwise negative.   PHYSICAL EXAM:  VS:  BP (!) 150/100   Pulse 67   Ht 5' 10.5" (1.791 m)   Wt 229 lb (103.9 kg)   BMI 32.39 kg/m  BMI: Body mass index is 32.39 kg/m. Well nourished, well developed, in no acute distress  HEENT: normocephalic, atraumatic  Neck: no JVD, carotid bruits or masses Cardiac:  RRR; no significant murmurs, no rubs, or gallops Lungs:  CTA b/l, no wheezing, rhonchi or rales  Abd: soft, nontender MS: no deformity or atrophy Ext:  no edema  Skin: warm and dry, no rash Neuro:  No gross deficits appreciated Psych: euthymic mood, full affect  PPM site is stable, no tethering or discomfort   EKG:  Done today and reviewed by myself shows  SR/VP 67bpm PPM interrogation done today and reviewed by  myself:  Battery and lead measurements are stable He has known prior A lead impedance alerts, impedence measurements today/of late are stable, known to have A lead noise that he will briefly track His P waves are consistently 3-72mV, his sensitivity was changed to 39mV Numerous AMS entries, EGMs reviewed are brief noise, all episodes are <1 minute I HVR is NSVT is old and previously seen No R waves today at 30bpm    04/07/18: EPS/ablation, Dr. Lovena Le CONCLUSIONS:  1. Isthmus-dependent right atrial flutter upon presentation.  2. Successful radiofrequency ablation of atrial flutter along the cavotricuspid isthmus with complete bidirectional isthmus block achieved using CARTO.  3. No inducible arrhythmias following ablation.  4. No early apparent complications   0/17/51: TTE Study Conclusions - Left  ventricle: The cavity size was normal. Systolic function was   normal. The estimated ejection fraction was in the range of 55%   to 60%. Images were inadequate for LV wall motion assessment. - Aortic valve: Trileaflet; mildly thickened, mildly calcified   leaflets. There was mild regurgitation. - Mitral valve: S/P mitral valve repair. There appears to be at   least moderate mitral stenosis with a mean MV gradient of   10-1412mmHg and MVA calculated at 1.39cm2. There is no mitral   regurgitation. Valve area by pressure half-time: 1.39 cm^2. - Left atrium: The atrium was mildly dilated. - Right ventricle: The cavity size was moderately dilated. Wall   thickness was normal. Systolic function was mildly to moderately   reduced. - Impressions: Normal LVF, s/p MV repair with moderate mitral   stenosis (Mean MV gradient has increased from 8mmHg to 10-3612mmHg   since last study), mild LAE, moderate RVE.  Impressions: - Normal LVF, s/p MV repair with moderate mitral stenosis (Mean MV   gradient has increased from 8mmHg to 10-1212mmHg since last study),   mild LAE, moderate RVE.     Recent  Labs: No results found for requested labs within last 8760 hours.  No results found for requested labs within last 8760 hours.   CrCl cannot be calculated (Patient's most recent lab result is older than the maximum 21 days allowed.).   Wt Readings from Last 3 Encounters:  05/12/19 229 lb (103.9 kg)  05/05/18 226 lb 3.2 oz (102.6 kg)  04/07/18 225 lb (102.1 kg)     Other studies reviewed: Additional studies/records reviewed today include: summarized above  ASSESSMENT AND PLAN:  1. CRT-P     Known A lead noise, impedence stable of late, 2 prior low impedence alerts (pt alert for this is programmed off     Stable from prior interrogations  2. PAF (?), h/o AFlutter ablation July 2019     CHA2DS2Vasc is 2, on Eliquis     BMET and CBC today  I don't see evidence of true Afib  When he last saw Dr. Ladona Ridgelaylor he mentioned AFib in his note and to continue Eliquis.....   3. HTN     High today, he reports his BP up/down, "all over the place" at home using a wrist style cuff.     Recheck by myself is 160/82 with reported compliance with his medicines   4. VHD     H/o TV and MV repairs     Update echo  5. NICM 6. Chronic CHF     On BB/ARB     Last echo with imporved LVEF     No symptom, exam findings to suggest fluid OL   Disposition: F/u with BP clinic in 2-3 weerks, he will keep daily BP log and bring his cuff to the visit, BMET and CBC today for his eliquis.  Continue with Q 31mo remotes and Dr. Ladona Ridgelaylor in a year. I will reach out to Dr. Ladona Ridgelaylor regarding his a/c.  Current medicines are reviewed at length with the patient today.  The patient did not have any concerns regarding medicines.  Norma FredricksonSigned, Renee Ursuy, PA-C 05/12/2019 2:57 PM     [facility] [address] [place] [address] [phone] (office)  [phone] (fax)

## 2019-05-12 ENCOUNTER — Ambulatory Visit (INDEPENDENT_AMBULATORY_CARE_PROVIDER_SITE_OTHER): Payer: Medicare Other | Admitting: Physician Assistant

## 2019-05-12 ENCOUNTER — Encounter: Payer: Self-pay | Admitting: Physician Assistant

## 2019-05-12 ENCOUNTER — Other Ambulatory Visit: Payer: Self-pay

## 2019-05-12 VITALS — BP 150/100 | HR 67 | Ht 70.5 in | Wt 229.0 lb

## 2019-05-12 DIAGNOSIS — I10 Essential (primary) hypertension: Secondary | ICD-10-CM

## 2019-05-12 DIAGNOSIS — Z950 Presence of cardiac pacemaker: Secondary | ICD-10-CM | POA: Diagnosis not present

## 2019-05-12 DIAGNOSIS — I428 Other cardiomyopathies: Secondary | ICD-10-CM | POA: Diagnosis not present

## 2019-05-12 DIAGNOSIS — Z9889 Other specified postprocedural states: Secondary | ICD-10-CM

## 2019-05-12 DIAGNOSIS — I480 Paroxysmal atrial fibrillation: Secondary | ICD-10-CM | POA: Diagnosis not present

## 2019-05-12 DIAGNOSIS — I442 Atrioventricular block, complete: Secondary | ICD-10-CM

## 2019-05-12 DIAGNOSIS — I5022 Chronic systolic (congestive) heart failure: Secondary | ICD-10-CM

## 2019-05-12 LAB — CUP PACEART REMOTE DEVICE CHECK
Battery Remaining Longevity: 24 mo
Battery Remaining Percentage: 25 %
Battery Voltage: 2.8 V
Brady Statistic AP VP Percent: 35 %
Brady Statistic AP VS Percent: 1 %
Brady Statistic AS VP Percent: 65 %
Brady Statistic AS VS Percent: 1 %
Brady Statistic RA Percent Paced: 34 %
Date Time Interrogation Session: 20200825092508
Implantable Lead Implant Date: 20010807
Implantable Lead Implant Date: 20010807
Implantable Lead Implant Date: 20140307
Implantable Lead Location: 753858
Implantable Lead Location: 753859
Implantable Lead Location: 753860
Implantable Pulse Generator Implant Date: 20140307
Lead Channel Impedance Value: 280 Ohm
Lead Channel Impedance Value: 350 Ohm
Lead Channel Impedance Value: 640 Ohm
Lead Channel Pacing Threshold Amplitude: 0.5 V
Lead Channel Pacing Threshold Amplitude: 0.75 V
Lead Channel Pacing Threshold Amplitude: 0.875 V
Lead Channel Pacing Threshold Pulse Width: 0.4 ms
Lead Channel Pacing Threshold Pulse Width: 0.4 ms
Lead Channel Pacing Threshold Pulse Width: 0.5 ms
Lead Channel Sensing Intrinsic Amplitude: 10.8 mV
Lead Channel Sensing Intrinsic Amplitude: 3.3 mV
Lead Channel Setting Pacing Amplitude: 2 V
Lead Channel Setting Pacing Amplitude: 2 V
Lead Channel Setting Pacing Amplitude: 2 V
Lead Channel Setting Pacing Pulse Width: 0.4 ms
Lead Channel Setting Pacing Pulse Width: 0.5 ms
Lead Channel Setting Sensing Sensitivity: 5 mV
Pulse Gen Model: 3210
Pulse Gen Serial Number: 2882239

## 2019-05-12 MED ORDER — CARVEDILOL 12.5 MG PO TABS: 12.5000 mg | ORAL_TABLET | Freq: Two times a day (BID) | ORAL | 3 refills | Status: DC

## 2019-05-12 MED ORDER — LOSARTAN POTASSIUM 50 MG PO TABS: 50.0000 mg | ORAL_TABLET | Freq: Every day | ORAL | 3 refills | Status: DC

## 2019-05-12 MED ORDER — SIMVASTATIN 80 MG PO TABS: 80.0000 mg | ORAL_TABLET | Freq: Every day | ORAL | 3 refills | Status: DC

## 2019-05-12 NOTE — Patient Instructions (Addendum)
Medication Instructions:  Your physician recommends that you continue on your current medications as directed. Please refer to the Current Medication list given to you today.  If you need a refill on your cardiac medications before your next appointment, please call your pharmacy.   Lab work: . BMET  AND CBC TODAY    If you have labs (blood work) drawn today and your tests are completely normal, you will receive your results only by: [name] MyChart Message (if you have MyChart) OR . A paper copy in the mail If you have any lab test that is abnormal or we need to change your treatment, we will call you to review the results.  Testing/Procedures:Your physician has requested that you have an echocardiogram. Echocardiography is a painless test that uses sound waves to create images of your heart. It provides your doctor with information about the size and shape of your heart and how well your heart's chambers and valves are working. This procedure takes approximately one hour. There are no restrictions for this procedure.   Follow-Up:  WITH PHARM D IN 3 TO 4 WEEKS FOR BLOOD PRESSURE ND BRING BLOOD PRESSURE CUFF WITH YOU   At [facility], you and your health needs are our priority.  As part of our continuing mission to provide you with exceptional heart care, we have created designated Provider Care Teams.  These Care Teams include your primary Cardiologist (physician) and Advanced Practice Providers (APPs -  Physician Assistants and Nurse Practitioners) who all work together to provide you with the care you need, when you need it. You will need a follow up appointment in 1 years.  Please call our office 2 months in advance to schedule this appointment.  You may see Dr. Lovena Le or one of the following Advanced Practice Providers on your designated Care Team:   Chanetta Marshall, NP . [name], PA-C  Any Other Special Instructions Will Be Listed Below (If Applicable).  KEEP A LOG OF BLOOD PRESSURE FOR  NEXT COUPLE OF WEEKS TO [place] WITH YOU  RETURN VISIT

## 2019-05-13 LAB — CBC
Hematocrit: 46.1 % (ref 37.5–51.0)
Hemoglobin: 15 g/dL (ref 13.0–17.7)
MCH: 28.1 pg (ref 26.6–33.0)
MCHC: 32.5 g/dL (ref 31.5–35.7)
MCV: 86 fL (ref 79–97)
Platelets: 201 10*3/uL (ref 150–450)
RBC: 5.34 x10E6/uL (ref 4.14–5.80)
RDW: 12.9 % (ref 11.6–15.4)
WBC: 5.5 10*3/uL (ref 3.4–10.8)

## 2019-05-13 LAB — BASIC METABOLIC PANEL
BUN/Creatinine Ratio: 9 — ABNORMAL LOW (ref 10–24)
BUN: 15 mg/dL (ref 8–27)
CO2: 26 mmol/L (ref 20–29)
Calcium: 9.5 mg/dL (ref 8.6–10.2)
Chloride: 103 mmol/L (ref 96–106)
Creatinine, Ser: 1.76 mg/dL — ABNORMAL HIGH (ref 0.76–1.27)
GFR calc Af Amer: 46 mL/min/{1.73_m2} — ABNORMAL LOW (ref >59)
GFR calc non Af Amer: 39 mL/min/{1.73_m2} — ABNORMAL LOW (ref >59)
Glucose: 54 mg/dL — ABNORMAL LOW (ref 65–99)
Potassium: 4.7 mmol/L (ref 3.5–5.2)
Sodium: 141 mmol/L (ref 134–144)

## 2019-05-14 ENCOUNTER — Other Ambulatory Visit: Payer: Self-pay | Admitting: *Deleted

## 2019-05-14 ENCOUNTER — Encounter: Payer: Self-pay | Admitting: Cardiology

## 2019-05-14 MED ORDER — CARVEDILOL 25 MG PO TABS: 25.0000 mg | ORAL_TABLET | Freq: Two times a day (BID) | ORAL | 0 refills | Status: DC

## 2019-05-14 MED ORDER — LOSARTAN POTASSIUM 50 MG PO TABS: 25.0000 mg | ORAL_TABLET | Freq: Every day | ORAL | 3 refills | Status: DC

## 2019-05-14 NOTE — Progress Notes (Signed)
Remote pacemaker transmission.   

## 2019-05-15 ENCOUNTER — Other Ambulatory Visit: Payer: Self-pay

## 2019-05-15 ENCOUNTER — Ambulatory Visit (HOSPITAL_COMMUNITY): Payer: Medicare Other | Attending: Cardiovascular Disease

## 2019-05-15 DIAGNOSIS — I110 Hypertensive heart disease with heart failure: Secondary | ICD-10-CM | POA: Insufficient documentation

## 2019-05-15 DIAGNOSIS — Z952 Presence of prosthetic heart valve: Secondary | ICD-10-CM | POA: Diagnosis not present

## 2019-05-15 DIAGNOSIS — Z950 Presence of cardiac pacemaker: Secondary | ICD-10-CM | POA: Insufficient documentation

## 2019-05-15 DIAGNOSIS — I050 Rheumatic mitral stenosis: Secondary | ICD-10-CM | POA: Diagnosis not present

## 2019-05-15 DIAGNOSIS — Z9889 Other specified postprocedural states: Secondary | ICD-10-CM | POA: Insufficient documentation

## 2019-05-15 DIAGNOSIS — Z87891 Personal history of nicotine dependence: Secondary | ICD-10-CM | POA: Diagnosis not present

## 2019-05-15 DIAGNOSIS — I509 Heart failure, unspecified: Secondary | ICD-10-CM | POA: Insufficient documentation

## 2019-06-04 ENCOUNTER — Ambulatory Visit: Payer: Medicare Other

## 2019-06-16 NOTE — Progress Notes (Signed)
Patient ID: CEBERT DETTMANN                 DOB: 13-Jun-1951                      MRN: [id number]     HPI: Russell Martin is a 68 y.o. male of Dr. Lovena Le referred by [name], PA to HTN clinic. PMH is significant for VHD with MV and TV repairs (2014), recovered CHF (05/15/2019 EF 45-50%), HTN, HLD, atrial flutter (ablated 03/2018), CHB w/ PPM, CRT-P. Patient was seen 1 month ago in clinic where BP was elevated at 150/100 mmHg. Renal function was lower than normal so the team increased carvedilol from 12.5mg  to 25mg  BID and decreased losartan from 50mg  to 25mg  daily. He was instructed to keep home BP log and follow up in 2-3 weeks for further management.  [name] presents in good spirits today. He brought a home blood pressure log and reports taking his blood pressure regularly each night at 9pm. He does not have headaches or feel dizzy with previous medication changes. Today his blood pressure was 165/85 mmHg.     Current HTN meds: carvedilol 25mg  BID, losartan 25mg  daily Previously tried: benazepril 10mg ,  BP goal: < 130/80 mmHg  Family History: diabetes (brother), HTN (2 brothers)  Social History: previous smoker, no alcohol or drug use  Diet: Reports eating mostly [place] and chicken with lots of vegetables and fruits. He does not add salt to food or while cooking. Boils hotdogs and sausages to help reduce salt. He desires red meat and fish but limits these to once per week or once every other week. He tried a new juice for his hypertension that contained beets, garlic, ginger and blueberries.  Exercise: cuts grass, works outside, plays basketball with the kids occasionally. He reports sometimes going on walks through neighborhood but frequently stopped to talk. He has a treadmill and stationary bike as well.   Home BP readings: taken each day around 9pm Sep 29 148/79, Sep 30 153/89, Oct 1 159/78, Oct 2 154/86, Oct 6 139/79  Wt Readings from Last 3 Encounters:  05/12/19 229 lb (103.9 kg)   05/05/18 226 lb 3.2 oz (102.6 kg)  04/07/18 225 lb (102.1 kg)   BP Readings from Last 3 Encounters:  05/12/19 (!) 150/100  03/10/19 (!) 179/79  05/05/18 (!) 146/92   Pulse Readings from Last 3 Encounters:  05/12/19 67  03/10/19 (!) 59  05/05/18 63    Renal function: CrCl cannot be calculated (Patient's most recent lab result is older than the maximum 21 days allowed.).  Past Medical History:  Diagnosis Date  . Cardiomyopathy ([place]) 1.10.14   reduced EF 30-35%  . CHB (complete heart block) ([place])    a. Pacer initially placed ~2002.   [name] CHF (congestive heart failure) ([place])    a. NICM EF 35% - no CAD by cath 09/2012. b. s/p upgrade to Bi-V pacemaker 11/2012 (did not get ICD as Dr. Lovena Le expects his EF will improve with bi-V pacing).  [name] Dyspnea    2d Echo, EF 40-45%, 5.20.14  . Hyperlipidemia   . Hypertension    pt denies HTN  . LBBB (left bundle branch block)    Myoview, LV enlarged  . Mitral valve regurgitation   . Pacemaker    st jude    greg taylor  . RBBB   . S/P mitral valve repair 04/29/2013   71mm Sorin Memo 3D ring annuloplasty  via right mini thoracotomy  . S/P tricuspid valve repair 04/29/2013   28 mm Edwards mc3 ring annuloplasty via right mini thoracotomy    Current Outpatient Medications on File Prior to Visit  Medication Sig Dispense Refill  . aspirin EC 81 MG tablet Take 1 tablet (81 mg total) by mouth daily.    [name] BIOTIN PO Take by mouth daily.    . carvedilol (COREG) 25 MG tablet Take 1 tablet (25 mg total) by mouth 2 (two) times daily with a meal. 60 tablet 0  . cholecalciferol (VITAMIN D) 1000 units tablet Take 2,000 Units by mouth daily.    [name] ELIQUIS 5 MG TABS tablet TAKE 1 TABLET TWICE A DAY 180 tablet 0  . losartan (COZAAR) 50 MG tablet Take 0.5 tablets (25 mg total) by mouth daily. 90 tablet 3  . Multiple Vitamin (MULTIVITAMIN WITH MINERALS) TABS Take 1 tablet by mouth daily.     . niacin (NIASPAN) 500 MG CR tablet TAKE 1 TABLET DAILY 90 tablet 0  .  simvastatin (ZOCOR) 80 MG tablet Take 1 tablet (80 mg total) by mouth at bedtime. 90 tablet 3   No current facility-administered medications on file prior to visit.     No Known Allergies   Assessment/Plan:  1. Hypertension - Patient continues to be above goal of < 130/80 mmHg, averaging 150s/80s mmHg and was 165/85 mmHg today in clinic. Start amlodipine 5mg  daily in the evening. Pt educated on potential side effects and signs/symptoms of low blood pressure. Continue Carvedilol 25mg  BID and Losartan 25mg  daily. Continue taking blood pressure at home each day and bring cuff to next visit. Continue making dietary changes and exercising. Follow up with BMET today to assess kidney function. At next clinic appointment in 3 weeks, set exercise goals.

## 2019-06-17 ENCOUNTER — Other Ambulatory Visit: Payer: Self-pay

## 2019-06-17 ENCOUNTER — Ambulatory Visit (INDEPENDENT_AMBULATORY_CARE_PROVIDER_SITE_OTHER): Payer: Medicare Other | Admitting: Pharmacist

## 2019-06-17 VITALS — BP 165/85 | HR 61

## 2019-06-17 DIAGNOSIS — I10 Essential (primary) hypertension: Secondary | ICD-10-CM | POA: Diagnosis not present

## 2019-06-17 MED ORDER — AMLODIPINE BESYLATE 5 MG PO TABS: 5.0000 mg | ORAL_TABLET | Freq: Every day | ORAL | 3 refills | Status: DC

## 2019-06-17 NOTE — Patient Instructions (Addendum)
Nice meeting you today!  Start Amlodipine 5 mg daily in the evening.  Continue Carvedilol 25 mg twice daily and Losartan 25 mg daily  Continue exercising and eating a low-carb, low-salt diet  Bring your at-home BP readings and BP cuff to your next follow-up visit.

## 2019-06-18 ENCOUNTER — Telehealth: Payer: Self-pay | Admitting: Pharmacist

## 2019-06-18 ENCOUNTER — Telehealth: Payer: Self-pay

## 2019-06-18 LAB — BASIC METABOLIC PANEL
BUN/Creatinine Ratio: 10 (ref 10–24)
BUN: 12 mg/dL (ref 8–27)
CO2: 26 mmol/L (ref 20–29)
Calcium: 9.6 mg/dL (ref 8.6–10.2)
Chloride: 103 mmol/L (ref 96–106)
Creatinine, Ser: 1.2 mg/dL (ref 0.76–1.27)
GFR calc Af Amer: 72 mL/min/{1.73_m2} (ref >59)
GFR calc non Af Amer: 63 mL/min/{1.73_m2} (ref >59)
Glucose: 104 mg/dL — ABNORMAL HIGH (ref 65–99)
Potassium: 4.6 mmol/L (ref 3.5–5.2)
Sodium: 142 mmol/L (ref 134–144)

## 2019-06-18 NOTE — Telephone Encounter (Signed)
-----   Message from Evans Lance, MD sent at 06/18/2019  8:12 AM EDT ----- No change.

## 2019-06-18 NOTE — Telephone Encounter (Signed)
Spoke with patient. Scr has normalized back to baseline. Patient to continue losartan 25mg  daily, carvediolol 25mg  twice a day and amlodipine 5mg  daily (added yesterday).

## 2019-06-18 NOTE — Telephone Encounter (Signed)
LMTCB

## 2019-07-08 ENCOUNTER — Other Ambulatory Visit: Payer: Self-pay

## 2019-07-08 ENCOUNTER — Ambulatory Visit (INDEPENDENT_AMBULATORY_CARE_PROVIDER_SITE_OTHER): Payer: Medicare Other | Admitting: Pharmacist

## 2019-07-08 VITALS — BP 150/80 | HR 60

## 2019-07-08 DIAGNOSIS — I10 Essential (primary) hypertension: Secondary | ICD-10-CM

## 2019-07-08 MED ORDER — LOSARTAN POTASSIUM 25 MG PO TABS: 25.0000 mg | ORAL_TABLET | Freq: Every day | ORAL | 3 refills | Status: DC

## 2019-07-08 MED ORDER — AMLODIPINE BESYLATE 10 MG PO TABS: 10.0000 mg | ORAL_TABLET | Freq: Every day | ORAL | 3 refills | Status: DC

## 2019-07-08 MED ORDER — CARVEDILOL 25 MG PO TABS: 25.0000 mg | ORAL_TABLET | Freq: Two times a day (BID) | ORAL | 3 refills | Status: DC

## 2019-07-08 NOTE — Progress Notes (Signed)
Patient ID: Russell Martin                 DOB: May 11, 1951                      MRN: [id number]     HPI: Russell Martin is a 68 y.o. male referred by Dr. Lovena Le to HTN clinic. PMH is significant for VHD with MV and TV repairs (2014), recovered CHF (05/15/19, EF 45-50%), HTN, HLD, atrial flutter (ablated 03/2018) and CHB w PPM, CRT-P. At previous pharmacy HTN visit, patient remained above goal BP < 130/80 mmHg by averaging in the 150s/80s. Amlodipine 5mg  was initiated and other medications continued.   Pt presents to clinic today in good spirits. He endorses making the medication change from last visit. Reports taking amlodipine 5mg  in the morning, carvedilol 25mg  twice a day (right now he takes 2 of the 12.5mg  tabs twice a day) and losartan 25 mg daily (takes half a tablet of the 50mg ). He has been taking his blood pressure each evening, averaging 129-135/70-80 mmHg and HR 60-63 bpm. Today in clinic, he used his home BP cuff and BP was 169/96 and 154/84 mmHg. He demonstrated appropriate use of the cuff and good technique. With clinic cuff, BP was 150/80 mmHg. His diet and exercise remains unchanged from previous visit.   Current HTN meds:  carvedilol 25mg  BID Losartan 25mg  daily Amlodipine 5mg  daily  Previously tried: benazepril 10mg  BP goal: < 130/80 mmHg  Family History: HTN (x2 brothers), diabetes (brother)  Social History: previous smoker, no alcohol or drug use  Diet: Reports eating mostly [place] and chicken with lots of vegetables and fruits. He does not add salt to food or while cooking. Boils hotdogs and sausages to help reduce salt. He desires red meat and fish but limits these to once per week or once every other week. He tried a new juice for his hypertension that contained beets, garlic, ginger and blueberries. Steak once a month. Cucumber juice. 1 tsp of apple cider vinegar and 1 tsp olive oil (sometimes, not every day)  Exercise: cuts grass, works outside, plays basketball with the  kids occasionally. He reports sometimes going on walks through neighborhood but frequently stopped to talk. He has a treadmill and stationary bike as well.   Home BP readings: 126/70, 129/72, 130/73, 131/72, 130/75 62, 130/71, 130/73, 146/82, 131/78  Wt Readings from Last 3 Encounters:  05/12/19 229 lb (103.9 kg)  05/05/18 226 lb 3.2 oz (102.6 kg)  04/07/18 225 lb (102.1 kg)   BP Readings from Last 3 Encounters:  06/17/19 (!) 165/85  05/12/19 (!) 150/100  03/10/19 (!) 179/79   Pulse Readings from Last 3 Encounters:  06/17/19 61  05/12/19 67  03/10/19 (!) 59    Renal function: CrCl cannot be calculated (Unknown ideal weight.).  Past Medical History:  Diagnosis Date  . Cardiomyopathy ([place]) 1.10.14   reduced EF 30-35%  . CHB (complete heart block) ([place])    a. Pacer initially placed ~2002.   [name] CHF (congestive heart failure) ([place])    a. NICM EF 35% - no CAD by cath 09/2012. b. s/p upgrade to Bi-V pacemaker 11/2012 (did not get ICD as Dr. Lovena Le expects his EF will improve with bi-V pacing).  [name] Dyspnea    2d Echo, EF 40-45%, 5.20.14  . Hyperlipidemia   . Hypertension    pt denies HTN  . LBBB (left bundle branch block)    Myoview, LV enlarged  .  Mitral valve regurgitation   . Pacemaker    st jude    greg taylor  . RBBB   . S/P mitral valve repair 04/29/2013   68mm Sorin Memo 3D ring annuloplasty via right mini thoracotomy  . S/P tricuspid valve repair 04/29/2013   28 mm Edwards mc3 ring annuloplasty via right mini thoracotomy    Current Outpatient Medications on File Prior to Visit  Medication Sig Dispense Refill  . amLODipine (NORVASC) 5 MG tablet Take 1 tablet (5 mg total) by mouth daily. 90 tablet 3  . aspirin EC 81 MG tablet Take 1 tablet (81 mg total) by mouth daily.    [name] BIOTIN PO Take by mouth daily.    . carvedilol (COREG) 25 MG tablet Take 1 tablet (25 mg total) by mouth 2 (two) times daily with a meal. 60 tablet 0  . cholecalciferol (VITAMIN D) 1000 units tablet  Take 2,000 Units by mouth daily.    [name] ELIQUIS 5 MG TABS tablet TAKE 1 TABLET TWICE A DAY 180 tablet 0  . losartan (COZAAR) 50 MG tablet Take 0.5 tablets (25 mg total) by mouth daily. 90 tablet 3  . Multiple Vitamin (MULTIVITAMIN WITH MINERALS) TABS Take 1 tablet by mouth daily.     . niacin (NIASPAN) 500 MG CR tablet TAKE 1 TABLET DAILY 90 tablet 0  . simvastatin (ZOCOR) 80 MG tablet Take 1 tablet (80 mg total) by mouth at bedtime. 90 tablet 3   No current facility-administered medications on file prior to visit.     No Known Allergies   Assessment/Plan:  1. Hypertension - Patient's BP has improved since the previous visit, but remains slightly above goal < 130/80 mmHg at 150/80 mmHg in clinic today. Given elevated BP in clinic and slightly elevated home BP, increase amlodipine to 10mg  daily. Continue carvedilol 25mg  BID and losartan 25mg  daily. Prescriptions sent to pharmacy for amlodipine 10mg , carvedilol 25mg  (patient had been doubling up from previous dose 12.5mg ) and losartan 25mg  (patient had been taking 1/2 of 50mg  tabs). Reinforced importance of diet and exercise, set a goal to walk or ride stationary bike 20-30 mins every other day. Will follow up in 1 month reassess BP and exercise. Instructed pt to call if numbers drop low or he starts to feel faint or lightheaded.  Patient seen by , PharmD canidate  , Pharm.D, BCPS [facility]  [address], [place],   Phone: [phone]; Fax: [phone]

## 2019-07-08 NOTE — Patient Instructions (Addendum)
Nice to see you today!  Keep up the good work with diet and exercise. Aim for a diet full of vegetables, fruit and lean meats (chicken, [place], fish). Try to limit salt intake by eating fresh or frozen vegetables (instead of canned), rinse canned vegetables prior to cooking and do not add any additional salt to meals.   Try to ride your stationary bike or walk on your treadmill for 20-30 mins every other day.  Your goal blood pressure is < 130/80 mmHg. In clinic, your blood pressure was 150/80 mmHg.  Medication Changes: Begin taking amlodipine 10mg  in the morning (you can take 2 of your 5mg  tablets in the morning until you run out)  Continue taking carvedilol 25mg  twice daily and losartan 25mg  once daily.  Monitor blood pressure at home daily and keep a log (on your phone or piece of paper) to bring with you to your next visit. Write down date, time, blood pressure and pulse.  We will see you back in 1 month for follow up visit.    Please give [place] a call at [phone] with any questions or concerns.

## 2019-07-18 DIAGNOSIS — Z23 Encounter for immunization: Secondary | ICD-10-CM | POA: Diagnosis not present

## 2019-07-21 ENCOUNTER — Encounter: Payer: Medicare Other | Admitting: Internal Medicine

## 2019-07-31 DIAGNOSIS — R7301 Impaired fasting glucose: Secondary | ICD-10-CM | POA: Diagnosis not present

## 2019-07-31 DIAGNOSIS — Z0000 Encounter for general adult medical examination without abnormal findings: Secondary | ICD-10-CM | POA: Diagnosis not present

## 2019-07-31 DIAGNOSIS — E7800 Pure hypercholesterolemia, unspecified: Secondary | ICD-10-CM | POA: Diagnosis not present

## 2019-07-31 DIAGNOSIS — R7309 Other abnormal glucose: Secondary | ICD-10-CM | POA: Diagnosis not present

## 2019-07-31 DIAGNOSIS — I429 Cardiomyopathy, unspecified: Secondary | ICD-10-CM | POA: Diagnosis not present

## 2019-07-31 DIAGNOSIS — I442 Atrioventricular block, complete: Secondary | ICD-10-CM | POA: Diagnosis not present

## 2019-07-31 DIAGNOSIS — R972 Elevated prostate specific antigen [PSA]: Secondary | ICD-10-CM | POA: Diagnosis not present

## 2019-07-31 DIAGNOSIS — N183 Chronic kidney disease, stage 3 unspecified: Secondary | ICD-10-CM | POA: Diagnosis not present

## 2019-07-31 DIAGNOSIS — Z7901 Long term (current) use of anticoagulants: Secondary | ICD-10-CM | POA: Diagnosis not present

## 2019-07-31 DIAGNOSIS — I10 Essential (primary) hypertension: Secondary | ICD-10-CM | POA: Diagnosis not present

## 2019-08-03 NOTE — Patient Instructions (Addendum)
Thank you for visiting [place] today!  Your goal blood pressure is < 130/80 mmHg. Your blood pressure today was 138/80.   Keep up the good work with improving diet and exercise! Aim to increase you exercise to 15-20 minutes 5 days a week.  Medication Changes:  Continue carvedilol 25 mg twice daily, losartan 25 mg daily, and amlodipine 10 mg daily.   Continue to check you blood pressure at home, and bring a log of your readings to your next visit.   We will see you in clinic on Tuesday, December 22nd at 10:00 AM  If you have any questions or concerns, please give [place] a call at [phone].

## 2019-08-03 NOTE — Progress Notes (Signed)
Patient ID: Russell Martin                 DOB: 22-Jan-1951                      MRN: [id number]     HPI: Russell Martin is a 68 y.o. male patient of Dr. Ladona Ridgel, initially referred to HTN clinic by Francis Dowse (PA-C). PMH is significant for VHD with MV and TV repairs (2014), recovered CHF (05/15/19, EF 45-50%), HTN, HLD, atrial flutter (ablated 03/2018) and CHB w PPM, CRT-P.   Mr. Frayne was first seen by Francis Dowse (PA-C) on 05/12/19, where his BP was found to be 150/100 and his SCr had increased from 1.26 to 1.76. At that time, his carvedilol was increased from 12.5 mg to 25 mg BID, and his losartan was decreased from 50 mg to 25 mg daily. He was then seen by the HTN clinic on 06/17/19, where his BP was 165/85 and his renal function was improved to baseline (Scr 1.2 on recheck). He was started on amlodipine 5 mg daily at that time. At his last HTN visit (07/08/19), his BP was improved but still elevated at 150/88. He brought in his home cuff, and demonstrated good technique, but it read slightly higher than in clinic (169/96 and 154/84). His amlodipine was increased to 10 mg daily, and all other medications continued.   Today, patient presents to clinic in good spirits. He endorses tolerating his medications well, including recent amlodipine increase. He denies any swelling, SOB, dizziness, or chest pain. His home BP log shows readings in the 120-130s/60-70s, however, in clinic his BP was initially 138/80, and 140/80 on recheck. His diet has remained consistent; he avoids additional salt in his food/cooking, and limits caffeine to 1-2 cups of coffee in the AM. He exercises a few times a week but admits to not exercising as much as he could. He would prefer to focus on improving exercise for a month over adding additional medication at this time.   Current HTN meds:  Carvedilol 25 mg BID Losartan 25 mg daily Amlodipine 10 mg daily  Previously tried: benazepril 10mg , losartan 50mg  daily - rise in SCr from  1.26 to 1.76 BP goal: < 130/80 mmHg  Family History: HTN (x2 brothers), diabetes (brother)  Social History: previous smoker, no alcohol or drug use  Diet: Reports eating mostly and chicken with lots of vegetables and fruits. He does not add salt to food or while cooking. Boils hotdogs and sausages to help reduce salt. He desires red meat and fish but limits these to once per week or once every other week. He tried a new juice for his hypertension that contained beets, garlic, ginger and blueberries. Steak once a month. Cucumber juice. 1 tsp of apple cider vinegar and 1 tsp olive oil (sometimes, not every day). 1-2 cup of coffee daily.   Exercise: cuts grass, works outside, plays basketball with the kids occasionally. He reports sometimes going on walks through neighborhood but frequently stopped to talk. He has a treadmill and stationary bike as well, which he uses a few times a week for 15-20 minutes.   Home BP readings:  This visit (08/04/19): 126/75 (64), 131/67 (61), 120/67 (61), 134/76 (61), 134/71 (61), 140/80 (61) Past readings (07/08/19): 126/70, 129/72, 130/73, 131/72, 130/75 62, 130/71, 130/73, 146/82, 131/78 Wt Readings from Last 3 Encounters:  05/12/19 229 lb (103.9 kg)  05/05/18 226 lb 3.2 oz (102.6 kg)  04/07/18 225 lb (102.1 kg)   BP Readings from Last 3 Encounters:  07/08/19 (!) 150/80  06/17/19 (!) 165/85  05/12/19 (!) 150/100   Pulse Readings from Last 3 Encounters:  07/08/19 60  06/17/19 61  05/12/19 67    Renal function: CrCl cannot be calculated (Patient's most recent lab result is older than the maximum 21 days allowed.).  Past Medical History:  Diagnosis Date  . Cardiomyopathy ([place]) 1.10.14   reduced EF 30-35%  . CHB (complete heart block) ([place])    a. Pacer initially placed ~2002.   [name] CHF (congestive heart failure) ([place])    a. NICM EF 35% - no CAD by cath 09/2012. b. s/p upgrade to Bi-V pacemaker 11/2012 (did not get ICD as Dr. Lovena Le expects  his EF will improve with bi-V pacing).  [name] Dyspnea    2d Echo, EF 40-45%, 5.20.14  . Hyperlipidemia   . Hypertension    pt denies HTN  . LBBB (left bundle branch block)    Myoview, LV enlarged  . Mitral valve regurgitation   . Pacemaker    st jude    greg taylor  . RBBB   . S/P mitral valve repair 04/29/2013   80mm Sorin Memo 3D ring annuloplasty via right mini thoracotomy  . S/P tricuspid valve repair 04/29/2013   28 mm Edwards mc3 ring annuloplasty via right mini thoracotomy    Current Outpatient Medications on File Prior to Visit  Medication Sig Dispense Refill  . amLODipine (NORVASC) 10 MG tablet Take 1 tablet (10 mg total) by mouth daily. 90 tablet 3  . aspirin EC 81 MG tablet Take 1 tablet (81 mg total) by mouth daily.    [name] BIOTIN PO Take by mouth daily.    . carvedilol (COREG) 25 MG tablet Take 1 tablet (25 mg total) by mouth 2 (two) times daily. 180 tablet 3  . cholecalciferol (VITAMIN D) 1000 units tablet Take 2,000 Units by mouth daily.    [name] ELIQUIS 5 MG TABS tablet TAKE 1 TABLET TWICE A DAY 180 tablet 0  . losartan (COZAAR) 25 MG tablet Take 1 tablet (25 mg total) by mouth daily. 90 tablet 3  . Multiple Vitamin (MULTIVITAMIN WITH MINERALS) TABS Take 1 tablet by mouth daily.     . niacin (NIASPAN) 500 MG CR tablet TAKE 1 TABLET DAILY 90 tablet 0  . simvastatin (ZOCOR) 80 MG tablet Take 1 tablet (80 mg total) by mouth at bedtime. 90 tablet 3   No current facility-administered medications on file prior to visit.     No Known Allergies   Assessment/Plan:  1. Hypertension - BP goal < 130/80 mmHg. Patient's BP remains elevated at 138/80 and 140/80 in clinic, some home visits at goal. Continue amlodipine 10 mg daily, losartan 25 mg daily, and carvedilol 25 mg BID. Per patient request, he will try increasing his exercise regimen to 15-20 minutes 5 times a week (utilizing his treadmill and stationary bike) for 1 month, and then see if additional medication would be necessary.  Future options include adding HCTZ 12.5 mg daily for additional blood pressure control. Patient encouraged to continue checking his BP at home daily and bring a log to his next visit. Follow-up in 4 weeks to assess BP control and need for additional medication. Refill for Eliquis also sent in to mail order pharmacy per pt request.   Claudina Lick, PharmD Candidate [facility] [address], [place] [address] Phone: [phone];  Fax: [phone]

## 2019-08-04 ENCOUNTER — Ambulatory Visit (INDEPENDENT_AMBULATORY_CARE_PROVIDER_SITE_OTHER): Payer: Medicare Other | Admitting: Pharmacist

## 2019-08-04 ENCOUNTER — Other Ambulatory Visit: Payer: Self-pay

## 2019-08-04 VITALS — BP 138/80 | HR 61

## 2019-08-04 DIAGNOSIS — I10 Essential (primary) hypertension: Secondary | ICD-10-CM

## 2019-08-04 DIAGNOSIS — I480 Paroxysmal atrial fibrillation: Secondary | ICD-10-CM | POA: Diagnosis not present

## 2019-08-04 LAB — CUP PACEART REMOTE DEVICE CHECK
Battery Remaining Longevity: 22 mo
Battery Remaining Percentage: 22 %
Battery Voltage: 2.78 V
Brady Statistic AP VP Percent: 51 %
Brady Statistic AP VS Percent: 1 %
Brady Statistic AS VP Percent: 49 %
Brady Statistic AS VS Percent: 1 %
Brady Statistic RA Percent Paced: 50 %
Date Time Interrogation Session: 20201124025816
Implantable Lead Implant Date: 20010807
Implantable Lead Implant Date: 20010807
Implantable Lead Implant Date: 20140307
Implantable Lead Location: 753858
Implantable Lead Location: 753859
Implantable Lead Location: 753860
Implantable Pulse Generator Implant Date: 20140307
Lead Channel Impedance Value: 280 Ohm
Lead Channel Impedance Value: 350 Ohm
Lead Channel Impedance Value: 640 Ohm
Lead Channel Pacing Threshold Amplitude: 0.5 V
Lead Channel Pacing Threshold Amplitude: 0.75 V
Lead Channel Pacing Threshold Amplitude: 0.875 V
Lead Channel Pacing Threshold Pulse Width: 0.4 ms
Lead Channel Pacing Threshold Pulse Width: 0.4 ms
Lead Channel Pacing Threshold Pulse Width: 0.5 ms
Lead Channel Sensing Intrinsic Amplitude: 10.8 mV
Lead Channel Sensing Intrinsic Amplitude: 2.6 mV
Lead Channel Setting Pacing Amplitude: 2 V
Lead Channel Setting Pacing Amplitude: 2 V
Lead Channel Setting Pacing Amplitude: 2 V
Lead Channel Setting Pacing Pulse Width: 0.4 ms
Lead Channel Setting Pacing Pulse Width: 0.5 ms
Lead Channel Setting Sensing Sensitivity: 5 mV
Pulse Gen Model: 3210
Pulse Gen Serial Number: 2882239

## 2019-08-04 MED ORDER — APIXABAN 5 MG PO TABS: 5.0000 mg | ORAL_TABLET | Freq: Two times a day (BID) | ORAL | 1 refills | Status: DC

## 2019-08-05 ENCOUNTER — Ambulatory Visit (INDEPENDENT_AMBULATORY_CARE_PROVIDER_SITE_OTHER): Payer: Medicare Other | Admitting: *Deleted

## 2019-08-05 DIAGNOSIS — I442 Atrioventricular block, complete: Secondary | ICD-10-CM | POA: Diagnosis not present

## 2019-08-24 DIAGNOSIS — M7502 Adhesive capsulitis of left shoulder: Secondary | ICD-10-CM | POA: Diagnosis not present

## 2019-09-01 ENCOUNTER — Ambulatory Visit (INDEPENDENT_AMBULATORY_CARE_PROVIDER_SITE_OTHER): Payer: Medicare Other | Admitting: Pharmacist

## 2019-09-01 ENCOUNTER — Other Ambulatory Visit: Payer: Self-pay

## 2019-09-01 VITALS — BP 130/76 | HR 60

## 2019-09-01 DIAGNOSIS — I10 Essential (primary) hypertension: Secondary | ICD-10-CM

## 2019-09-01 NOTE — Progress Notes (Signed)
Patient ID: Russell Martin                 DOB: 25-Mar-1951                      MRN: [id number]     HPI: Russell Martin is a 68 y.o. male patient of Dr. Ladona Ridgel, initially referred to HTN clinic by Francis Dowse (PA-C). PMH is significant for VHD with MV and TV repairs (2014), recovered CHF (05/15/19, EF 45-50%), HTN, HLD, atrial flutter (ablated 03/2018) and CHB w PPM, CRT-P.   Russell Martin was first seen by Francis Dowse (PA-C) on 05/12/19, where his BP was found to be 150/100 and his SCr had increased from 1.26 to 1.76. At that time, his carvedilol was increased from 12.5 mg to 25 mg BID, and his losartan was decreased from 50 mg to 25 mg daily. He was then seen by the HTN clinic on 06/17/19, where his BP was 165/85 and his renal function was improved to baseline (Scr 1.2 on recheck). He was started on amlodipine 5 mg daily at that time. At his last HTN visit (07/08/19), his BP was improved but still elevated at 150/88. He brought in his home cuff, and demonstrated good technique, but it read slightly higher than in clinic (169/96 and 154/84). His amlodipine was increased to 10 mg daily, and all other medications continued.   At last visit patients home blood pressures were either at or close to goal. His clinic blood pressure was slightly above goal. Patient requested time to improve his exercise as he felt he could exercise more often than he had been. Patient encouraged to increase his exercise to 15-20 min 5 days a week. No medication changes were made.  Patient presents today to HTN clinic for follow up. Blood pressure at home has been running in the 120-130's systolic. Average blood pressure based off of readings is 116 systolic. Patient blood pressure cuff if also know to run slightly higher.   Patient denies dizziness, lightheadedness, headache, blurred vision, SOB or swelling. He states that he has had an issue with his shoulder that he could raise his arm. He has gotten a steroid shot and oral dose course  which he has finished. Shoulder is a little better. States he has only been exercising 2 1/2 days a week 30 min walk. This is not as much as patient would like due to arm issues.  Current HTN meds:  Carvedilol 25 mg BID Losartan 25 mg daily Amlodipine 10 mg daily  Previously tried: benazepril 10mg , losartan 50mg  daily - rise in SCr from 1.26 to 1.76 BP goal: < 130/80 mmHg  Family History: HTN (x2 brothers), diabetes (brother)  Social History: previous smoker, no alcohol or drug use  Diet: Reports eating mostly [place] and chicken with lots of vegetables and fruits. He does not add salt to food or while cooking. Boils hotdogs and sausages to help reduce salt. He desires red meat and fish but limits these to once per week or once every other week. He tried a new juice for his hypertension that contained beets, garlic, ginger and blueberries. Steak once a month. Cucumber juice. 1 tsp of apple cider vinegar and 1 tsp olive oil (sometimes, not every day). 1-2 cup of coffee daily.   Exercise: cuts grass, works outside, plays basketball with the kids occasionally. He reports sometimes going on walks through neighborhood but frequently stopped to talk. He has a treadmill and stationary  bike as well, which he uses a few times a week for 15-20 minutes.   Home BP readings: 114/66 HR 61, 137/82 HR 60, 129/73 HR 61, 131/79 HR 60, 135/75 HR 61, 136/75 HR 65, 119/65 HR 61, 121/72 HR 60, 137/74 HR 67 137/74 HR 61 124/74 HR 60 125/72 HR 61, 127/74 HR 63, 122/71 HR 60 120-130's  Wt Readings from Last 3 Encounters:  05/12/19 229 lb (103.9 kg)  05/05/18 226 lb 3.2 oz (102.6 kg)  04/07/18 225 lb (102.1 kg)   BP Readings from Last 3 Encounters:  08/04/19 138/80  07/08/19 (!) 150/80  06/17/19 (!) 165/85   Pulse Readings from Last 3 Encounters:  08/04/19 61  07/08/19 60  06/17/19 61    Renal function: CrCl cannot be calculated (Patient's most recent lab result is older than the maximum 21 days  allowed.).  Past Medical History:  Diagnosis Date  . Cardiomyopathy ([place]) 1.10.14   reduced EF 30-35%  . CHB (complete heart block) ([place])    a. Pacer initially placed ~2002.   [name] CHF (congestive heart failure) ([place])    a. NICM EF 35% - no CAD by cath 09/2012. b. s/p upgrade to Bi-V pacemaker 11/2012 (did not get ICD as Dr. Lovena Le expects his EF will improve with bi-V pacing).  [name] Dyspnea    2d Echo, EF 40-45%, 5.20.14  . Hyperlipidemia   . Hypertension    pt denies HTN  . LBBB (left bundle branch block)    Myoview, LV enlarged  . Mitral valve regurgitation   . Pacemaker    st jude    greg taylor  . RBBB   . S/P mitral valve repair 04/29/2013   16mm Sorin Memo 3D ring annuloplasty via right mini thoracotomy  . S/P tricuspid valve repair 04/29/2013   28 mm Edwards mc3 ring annuloplasty via right mini thoracotomy    Current Outpatient Medications on File Prior to Visit  Medication Sig Dispense Refill  . amLODipine (NORVASC) 10 MG tablet Take 1 tablet (10 mg total) by mouth daily. 90 tablet 3  . apixaban (ELIQUIS) 5 MG TABS tablet Take 1 tablet (5 mg total) by mouth 2 (two) times daily. 180 tablet 1  . aspirin EC 81 MG tablet Take 1 tablet (81 mg total) by mouth daily.    [name] BIOTIN PO Take by mouth daily.    . carvedilol (COREG) 25 MG tablet Take 1 tablet (25 mg total) by mouth 2 (two) times daily. 180 tablet 3  . cholecalciferol (VITAMIN D) 1000 units tablet Take 2,000 Units by mouth daily.    [name] losartan (COZAAR) 25 MG tablet Take 1 tablet (25 mg total) by mouth daily. 90 tablet 3  . Multiple Vitamin (MULTIVITAMIN WITH MINERALS) TABS Take 1 tablet by mouth daily.     . niacin (NIASPAN) 500 MG CR tablet TAKE 1 TABLET DAILY 90 tablet 0  . simvastatin (ZOCOR) 80 MG tablet Take 1 tablet (80 mg total) by mouth at bedtime. 90 tablet 3   No current facility-administered medications on file prior to visit.    No Known Allergies   Assessment/Plan:  1. Hypertension -Blood pressure  basically at goal of <130/80. Home blood pressure readings average is at goal. Will not make any medication changes. Continue taking carvedilol 25 mg BID, losartan 25 mg daily and amlodipine 10 mg daily. Patient encouraged to increase exercise back to 5 days a week as tolerated and continue to check his blood pressure at home. Follow up  in HTN clinic as needed.  Thank you,  Olene Floss, Pharm.D, BCPS, CPP [facility]  [address], [place], [place] [address]  Phone: [phone]; Fax: [phone]

## 2019-09-01 NOTE — Progress Notes (Signed)
PPM remote 

## 2019-09-01 NOTE — Patient Instructions (Addendum)
It was a pleasure to see you again in clinic.  Please continue taking carvedilol 25 mg BID, losartan 25 mg daily and amlodipine 10 mg daily for your blood pressure.  Try to increase your exercise back up to 5 days a week for 30 min at a time.  Continue checking your blood pressure at home. Please call us if your blood pressure starts to consistently run above 130/80.  Call us at [phone] with any questions or concerns

## 2019-09-14 ENCOUNTER — Telehealth: Payer: Self-pay | Admitting: *Deleted

## 2019-09-14 NOTE — Telephone Encounter (Signed)
   Primary Cardiologist: Dr.Taylor  Chart reviewed as part of pre-operative protocol coverage. He is being followed for Atrial fib on Eliquis, NICM, Chronic Systolic CHF, CHF with PPM -CRT-P in situ, with other history to include hypertension and hyperlipidemia.  Given past medical history and time since last visit, based on ACC/AHA guidelines, KATELYN KOHLMEYER would be at acceptable risk for the planned procedure without further cardiovascular testing.   Per pharmacy 09/14/2019:  CHADS2-VASc score of  3 (CHF, HTN, AGE)  CrCl 73 ml/min  Per office protocol, patient can hold Eliquis for 2 days prior to procedure.    I will route this recommendation to the requesting party via Epic fax function and remove from pre-op pool.  Please call with questions.  Bettey Mare. Liborio Nixon, ANP, AACC  09/14/2019, 2:54 PM

## 2019-09-14 NOTE — Telephone Encounter (Signed)
Patient with diagnosis of aflutter on Eliquis for anticoagulation.    Procedure: CT ARTHROGRAM  Date of procedure: TBD  CHADS2-VASc score of  3 (CHF, HTN, AGE)  CrCl 73 ml/min  Per office protocol, patient can hold Eliquis for 2 days prior to procedure.    Spoke to patient who said CT will be on his shoulder.

## 2019-09-14 NOTE — Telephone Encounter (Signed)
   [facility] Pre-operative Risk Assessment    Request for surgical clearance:  1. What type of surgery is being performed? CT ARTHROGRAM   2. When is this surgery scheduled? TBD   3. What type of clearance is required (medical clearance vs. Pharmacy clearance to hold med vs. Both)? BOTH  4. Are there any medications that need to be held prior to surgery and how long? ELIQUIS X 48 HOURS PRIOR   5. Practice name and name of physician performing surgery? [facility]; DOCTOR IS NOT LISTED   6. What is your office phone number [phone]     7.   What is your office fax number [phone]  8.   Anesthesia type (None, local, MAC, general) ? NOT LISTED   Julaine Hua 09/14/2019, 11:16 AM  _________________________________________________________________   (provider comments below)

## 2019-09-15 ENCOUNTER — Other Ambulatory Visit: Payer: Self-pay | Admitting: Orthopedic Surgery

## 2019-09-15 DIAGNOSIS — M25512 Pain in left shoulder: Secondary | ICD-10-CM

## 2019-09-16 ENCOUNTER — Other Ambulatory Visit: Payer: Self-pay | Admitting: Internal Medicine

## 2019-10-02 ENCOUNTER — Ambulatory Visit
Admission: RE | Admit: 2019-10-02 | Discharge: 2019-10-02 | Disposition: A | Discharge status: Home / Self Care | Payer: Medicare Other | Source: Ambulatory Visit | Attending: Orthopedic Surgery | Admitting: Orthopedic Surgery

## 2019-10-02 ENCOUNTER — Other Ambulatory Visit: Payer: Self-pay

## 2019-10-02 DIAGNOSIS — M25512 Pain in left shoulder: Secondary | ICD-10-CM

## 2019-10-02 MED ORDER — IOPAMIDOL (ISOVUE-M 200) INJECTION 41%: 13.0000 mL | Freq: Once | INTRAMUSCULAR | Status: DC

## 2019-10-05 DIAGNOSIS — M19012 Primary osteoarthritis, left shoulder: Secondary | ICD-10-CM | POA: Diagnosis not present

## 2019-10-07 DIAGNOSIS — M6281 Muscle weakness (generalized): Secondary | ICD-10-CM | POA: Diagnosis not present

## 2019-10-07 DIAGNOSIS — M25612 Stiffness of left shoulder, not elsewhere classified: Secondary | ICD-10-CM | POA: Diagnosis not present

## 2019-10-07 DIAGNOSIS — M25512 Pain in left shoulder: Secondary | ICD-10-CM | POA: Diagnosis not present

## 2019-10-07 DIAGNOSIS — M19012 Primary osteoarthritis, left shoulder: Secondary | ICD-10-CM | POA: Diagnosis not present

## 2019-10-09 DIAGNOSIS — M6281 Muscle weakness (generalized): Secondary | ICD-10-CM | POA: Diagnosis not present

## 2019-10-09 DIAGNOSIS — M25512 Pain in left shoulder: Secondary | ICD-10-CM | POA: Diagnosis not present

## 2019-10-09 DIAGNOSIS — M25612 Stiffness of left shoulder, not elsewhere classified: Secondary | ICD-10-CM | POA: Diagnosis not present

## 2019-10-09 DIAGNOSIS — M19012 Primary osteoarthritis, left shoulder: Secondary | ICD-10-CM | POA: Diagnosis not present

## 2019-10-14 DIAGNOSIS — M25612 Stiffness of left shoulder, not elsewhere classified: Secondary | ICD-10-CM | POA: Diagnosis not present

## 2019-10-14 DIAGNOSIS — M19012 Primary osteoarthritis, left shoulder: Secondary | ICD-10-CM | POA: Diagnosis not present

## 2019-10-14 DIAGNOSIS — M25512 Pain in left shoulder: Secondary | ICD-10-CM | POA: Diagnosis not present

## 2019-10-14 DIAGNOSIS — M6281 Muscle weakness (generalized): Secondary | ICD-10-CM | POA: Diagnosis not present

## 2019-10-16 DIAGNOSIS — M25612 Stiffness of left shoulder, not elsewhere classified: Secondary | ICD-10-CM | POA: Diagnosis not present

## 2019-10-16 DIAGNOSIS — M25512 Pain in left shoulder: Secondary | ICD-10-CM | POA: Diagnosis not present

## 2019-10-16 DIAGNOSIS — M6281 Muscle weakness (generalized): Secondary | ICD-10-CM | POA: Diagnosis not present

## 2019-10-16 DIAGNOSIS — M19012 Primary osteoarthritis, left shoulder: Secondary | ICD-10-CM | POA: Diagnosis not present

## 2019-10-21 DIAGNOSIS — M25512 Pain in left shoulder: Secondary | ICD-10-CM | POA: Diagnosis not present

## 2019-10-21 DIAGNOSIS — M6281 Muscle weakness (generalized): Secondary | ICD-10-CM | POA: Diagnosis not present

## 2019-10-21 DIAGNOSIS — M25612 Stiffness of left shoulder, not elsewhere classified: Secondary | ICD-10-CM | POA: Diagnosis not present

## 2019-10-21 DIAGNOSIS — M19012 Primary osteoarthritis, left shoulder: Secondary | ICD-10-CM | POA: Diagnosis not present

## 2019-10-23 DIAGNOSIS — M19012 Primary osteoarthritis, left shoulder: Secondary | ICD-10-CM | POA: Diagnosis not present

## 2019-10-23 DIAGNOSIS — M25512 Pain in left shoulder: Secondary | ICD-10-CM | POA: Diagnosis not present

## 2019-10-23 DIAGNOSIS — M25612 Stiffness of left shoulder, not elsewhere classified: Secondary | ICD-10-CM | POA: Diagnosis not present

## 2019-10-23 DIAGNOSIS — M6281 Muscle weakness (generalized): Secondary | ICD-10-CM | POA: Diagnosis not present

## 2019-10-28 DIAGNOSIS — M19012 Primary osteoarthritis, left shoulder: Secondary | ICD-10-CM | POA: Diagnosis not present

## 2019-10-28 DIAGNOSIS — M25512 Pain in left shoulder: Secondary | ICD-10-CM | POA: Diagnosis not present

## 2019-10-28 DIAGNOSIS — M25612 Stiffness of left shoulder, not elsewhere classified: Secondary | ICD-10-CM | POA: Diagnosis not present

## 2019-10-28 DIAGNOSIS — M6281 Muscle weakness (generalized): Secondary | ICD-10-CM | POA: Diagnosis not present

## 2019-10-30 DIAGNOSIS — M6281 Muscle weakness (generalized): Secondary | ICD-10-CM | POA: Diagnosis not present

## 2019-10-30 DIAGNOSIS — M19012 Primary osteoarthritis, left shoulder: Secondary | ICD-10-CM | POA: Diagnosis not present

## 2019-10-30 DIAGNOSIS — M25512 Pain in left shoulder: Secondary | ICD-10-CM | POA: Diagnosis not present

## 2019-10-30 DIAGNOSIS — M25612 Stiffness of left shoulder, not elsewhere classified: Secondary | ICD-10-CM | POA: Diagnosis not present

## 2019-11-04 ENCOUNTER — Ambulatory Visit (INDEPENDENT_AMBULATORY_CARE_PROVIDER_SITE_OTHER): Payer: Medicare Other | Admitting: *Deleted

## 2019-11-04 DIAGNOSIS — I442 Atrioventricular block, complete: Secondary | ICD-10-CM

## 2019-11-04 LAB — CUP PACEART REMOTE DEVICE CHECK
Battery Remaining Longevity: 17 mo
Battery Remaining Percentage: 17 %
Battery Voltage: 2.75 V
Brady Statistic AP VP Percent: 47 %
Brady Statistic AP VS Percent: 1 %
Brady Statistic AS VP Percent: 53 %
Brady Statistic AS VS Percent: 1 %
Brady Statistic RA Percent Paced: 46 %
Date Time Interrogation Session: 20210224053018
Implantable Lead Implant Date: 20010807
Implantable Lead Implant Date: 20010807
Implantable Lead Implant Date: 20140307
Implantable Lead Location: 753858
Implantable Lead Location: 753859
Implantable Lead Location: 753860
Implantable Pulse Generator Implant Date: 20140307
Lead Channel Impedance Value: 290 Ohm
Lead Channel Impedance Value: 360 Ohm
Lead Channel Impedance Value: 650 Ohm
Lead Channel Pacing Threshold Amplitude: 0.5 V
Lead Channel Pacing Threshold Amplitude: 0.75 V
Lead Channel Pacing Threshold Amplitude: 0.875 V
Lead Channel Pacing Threshold Pulse Width: 0.4 ms
Lead Channel Pacing Threshold Pulse Width: 0.4 ms
Lead Channel Pacing Threshold Pulse Width: 0.5 ms
Lead Channel Sensing Intrinsic Amplitude: 10.8 mV
Lead Channel Sensing Intrinsic Amplitude: 3.6 mV
Lead Channel Setting Pacing Amplitude: 2 V
Lead Channel Setting Pacing Amplitude: 2 V
Lead Channel Setting Pacing Amplitude: 2 V
Lead Channel Setting Pacing Pulse Width: 0.4 ms
Lead Channel Setting Pacing Pulse Width: 0.5 ms
Lead Channel Setting Sensing Sensitivity: 5 mV
Pulse Gen Model: 3210
Pulse Gen Serial Number: 2882239

## 2019-11-05 NOTE — Progress Notes (Signed)
PPM Remote  

## 2019-11-06 ENCOUNTER — Other Ambulatory Visit: Payer: Self-pay | Admitting: Internal Medicine

## 2019-11-09 DIAGNOSIS — M19012 Primary osteoarthritis, left shoulder: Secondary | ICD-10-CM | POA: Diagnosis not present

## 2019-11-11 DIAGNOSIS — M25512 Pain in left shoulder: Secondary | ICD-10-CM | POA: Diagnosis not present

## 2019-11-11 DIAGNOSIS — M25612 Stiffness of left shoulder, not elsewhere classified: Secondary | ICD-10-CM | POA: Diagnosis not present

## 2019-11-11 DIAGNOSIS — M6281 Muscle weakness (generalized): Secondary | ICD-10-CM | POA: Diagnosis not present

## 2019-11-11 DIAGNOSIS — M19012 Primary osteoarthritis, left shoulder: Secondary | ICD-10-CM | POA: Diagnosis not present

## 2019-11-13 DIAGNOSIS — M25512 Pain in left shoulder: Secondary | ICD-10-CM | POA: Diagnosis not present

## 2019-11-13 DIAGNOSIS — M19012 Primary osteoarthritis, left shoulder: Secondary | ICD-10-CM | POA: Diagnosis not present

## 2019-11-13 DIAGNOSIS — M6281 Muscle weakness (generalized): Secondary | ICD-10-CM | POA: Diagnosis not present

## 2019-11-13 DIAGNOSIS — M25612 Stiffness of left shoulder, not elsewhere classified: Secondary | ICD-10-CM | POA: Diagnosis not present

## 2019-11-17 ENCOUNTER — Other Ambulatory Visit: Payer: Self-pay | Admitting: *Deleted

## 2019-11-17 ENCOUNTER — Encounter: Payer: Self-pay | Admitting: *Deleted

## 2019-11-17 DIAGNOSIS — I10 Essential (primary) hypertension: Secondary | ICD-10-CM

## 2019-11-17 DIAGNOSIS — I480 Paroxysmal atrial fibrillation: Secondary | ICD-10-CM

## 2019-11-17 MED ORDER — APIXABAN 5 MG PO TABS: 5.0000 mg | ORAL_TABLET | Freq: Two times a day (BID) | ORAL | 1 refills | Status: DC

## 2019-11-17 MED ORDER — LOSARTAN POTASSIUM 25 MG PO TABS: 25.0000 mg | ORAL_TABLET | Freq: Every day | ORAL | 1 refills | Status: DC

## 2019-11-17 MED ORDER — AMLODIPINE BESYLATE 10 MG PO TABS: 10.0000 mg | ORAL_TABLET | Freq: Every day | ORAL | 1 refills | Status: DC

## 2019-11-17 MED ORDER — CARVEDILOL 25 MG PO TABS: 25.0000 mg | ORAL_TABLET | Freq: Two times a day (BID) | ORAL | 1 refills | Status: DC

## 2019-11-17 MED ORDER — NIACIN ER (ANTIHYPERLIPIDEMIC) 500 MG PO TBCR: 500.0000 mg | EXTENDED_RELEASE_TABLET | Freq: Every day | ORAL | 1 refills | Status: DC

## 2019-11-17 MED ORDER — SIMVASTATIN 80 MG PO TABS: 80.0000 mg | ORAL_TABLET | Freq: Every day | ORAL | 1 refills | Status: DC

## 2019-11-17 NOTE — Telephone Encounter (Signed)
This encounter was created in error - please disregard.

## 2019-11-17 NOTE — Telephone Encounter (Signed)
Patient is here, he stated that [facility] mail order the pharmacist stated that his medication need to be updated and submitted.

## 2019-11-17 NOTE — Telephone Encounter (Signed)
Medications refilled

## 2019-11-18 ENCOUNTER — Other Ambulatory Visit: Payer: Self-pay

## 2019-11-18 MED ORDER — NIACIN ER (ANTIHYPERLIPIDEMIC) 500 MG PO TBCR: 500.0000 mg | EXTENDED_RELEASE_TABLET | Freq: Every day | ORAL | 2 refills | Status: DC

## 2019-11-18 MED ORDER — SIMVASTATIN 80 MG PO TABS: 80.0000 mg | ORAL_TABLET | Freq: Every day | ORAL | 2 refills | Status: DC

## 2019-11-25 DIAGNOSIS — M25512 Pain in left shoulder: Secondary | ICD-10-CM | POA: Diagnosis not present

## 2019-11-25 DIAGNOSIS — M19012 Primary osteoarthritis, left shoulder: Secondary | ICD-10-CM | POA: Diagnosis not present

## 2019-11-25 DIAGNOSIS — M6281 Muscle weakness (generalized): Secondary | ICD-10-CM | POA: Diagnosis not present

## 2019-11-25 DIAGNOSIS — M25612 Stiffness of left shoulder, not elsewhere classified: Secondary | ICD-10-CM | POA: Diagnosis not present

## 2019-12-02 DIAGNOSIS — M25512 Pain in left shoulder: Secondary | ICD-10-CM | POA: Diagnosis not present

## 2019-12-02 DIAGNOSIS — M6281 Muscle weakness (generalized): Secondary | ICD-10-CM | POA: Diagnosis not present

## 2019-12-02 DIAGNOSIS — M25612 Stiffness of left shoulder, not elsewhere classified: Secondary | ICD-10-CM | POA: Diagnosis not present

## 2019-12-02 DIAGNOSIS — M19012 Primary osteoarthritis, left shoulder: Secondary | ICD-10-CM | POA: Diagnosis not present

## 2019-12-04 DIAGNOSIS — M6281 Muscle weakness (generalized): Secondary | ICD-10-CM | POA: Diagnosis not present

## 2019-12-04 DIAGNOSIS — M25612 Stiffness of left shoulder, not elsewhere classified: Secondary | ICD-10-CM | POA: Diagnosis not present

## 2019-12-04 DIAGNOSIS — M19012 Primary osteoarthritis, left shoulder: Secondary | ICD-10-CM | POA: Diagnosis not present

## 2019-12-04 DIAGNOSIS — M25512 Pain in left shoulder: Secondary | ICD-10-CM | POA: Diagnosis not present

## 2019-12-08 DIAGNOSIS — M6281 Muscle weakness (generalized): Secondary | ICD-10-CM | POA: Diagnosis not present

## 2019-12-08 DIAGNOSIS — M25512 Pain in left shoulder: Secondary | ICD-10-CM | POA: Diagnosis not present

## 2019-12-08 DIAGNOSIS — M25612 Stiffness of left shoulder, not elsewhere classified: Secondary | ICD-10-CM | POA: Diagnosis not present

## 2019-12-08 DIAGNOSIS — M19012 Primary osteoarthritis, left shoulder: Secondary | ICD-10-CM | POA: Diagnosis not present

## 2019-12-09 DIAGNOSIS — M25512 Pain in left shoulder: Secondary | ICD-10-CM | POA: Diagnosis not present

## 2019-12-09 DIAGNOSIS — M6281 Muscle weakness (generalized): Secondary | ICD-10-CM | POA: Diagnosis not present

## 2019-12-09 DIAGNOSIS — M19012 Primary osteoarthritis, left shoulder: Secondary | ICD-10-CM | POA: Diagnosis not present

## 2019-12-09 DIAGNOSIS — M25612 Stiffness of left shoulder, not elsewhere classified: Secondary | ICD-10-CM | POA: Diagnosis not present

## 2019-12-14 DIAGNOSIS — M19012 Primary osteoarthritis, left shoulder: Secondary | ICD-10-CM | POA: Diagnosis not present

## 2019-12-16 DIAGNOSIS — M25612 Stiffness of left shoulder, not elsewhere classified: Secondary | ICD-10-CM | POA: Diagnosis not present

## 2019-12-16 DIAGNOSIS — M6281 Muscle weakness (generalized): Secondary | ICD-10-CM | POA: Diagnosis not present

## 2019-12-16 DIAGNOSIS — M19012 Primary osteoarthritis, left shoulder: Secondary | ICD-10-CM | POA: Diagnosis not present

## 2019-12-18 DIAGNOSIS — M25612 Stiffness of left shoulder, not elsewhere classified: Secondary | ICD-10-CM | POA: Diagnosis not present

## 2019-12-18 DIAGNOSIS — S39012D Strain of muscle, fascia and tendon of lower back, subsequent encounter: Secondary | ICD-10-CM | POA: Diagnosis not present

## 2019-12-18 DIAGNOSIS — M25512 Pain in left shoulder: Secondary | ICD-10-CM | POA: Diagnosis not present

## 2019-12-18 DIAGNOSIS — M6281 Muscle weakness (generalized): Secondary | ICD-10-CM | POA: Diagnosis not present

## 2019-12-23 DIAGNOSIS — M19012 Primary osteoarthritis, left shoulder: Secondary | ICD-10-CM | POA: Diagnosis not present

## 2019-12-23 DIAGNOSIS — M25612 Stiffness of left shoulder, not elsewhere classified: Secondary | ICD-10-CM | POA: Diagnosis not present

## 2019-12-23 DIAGNOSIS — M6281 Muscle weakness (generalized): Secondary | ICD-10-CM | POA: Diagnosis not present

## 2019-12-25 DIAGNOSIS — M6281 Muscle weakness (generalized): Secondary | ICD-10-CM | POA: Diagnosis not present

## 2019-12-25 DIAGNOSIS — M25512 Pain in left shoulder: Secondary | ICD-10-CM | POA: Diagnosis not present

## 2019-12-25 DIAGNOSIS — M19012 Primary osteoarthritis, left shoulder: Secondary | ICD-10-CM | POA: Diagnosis not present

## 2019-12-25 DIAGNOSIS — M25612 Stiffness of left shoulder, not elsewhere classified: Secondary | ICD-10-CM | POA: Diagnosis not present

## 2019-12-30 DIAGNOSIS — M25512 Pain in left shoulder: Secondary | ICD-10-CM | POA: Diagnosis not present

## 2019-12-30 DIAGNOSIS — M19012 Primary osteoarthritis, left shoulder: Secondary | ICD-10-CM | POA: Diagnosis not present

## 2019-12-30 DIAGNOSIS — M25612 Stiffness of left shoulder, not elsewhere classified: Secondary | ICD-10-CM | POA: Diagnosis not present

## 2019-12-30 DIAGNOSIS — M6281 Muscle weakness (generalized): Secondary | ICD-10-CM | POA: Diagnosis not present

## 2020-01-06 DIAGNOSIS — M19012 Primary osteoarthritis, left shoulder: Secondary | ICD-10-CM | POA: Diagnosis not present

## 2020-01-06 DIAGNOSIS — M25512 Pain in left shoulder: Secondary | ICD-10-CM | POA: Diagnosis not present

## 2020-01-06 DIAGNOSIS — M6281 Muscle weakness (generalized): Secondary | ICD-10-CM | POA: Diagnosis not present

## 2020-01-06 DIAGNOSIS — M25612 Stiffness of left shoulder, not elsewhere classified: Secondary | ICD-10-CM | POA: Diagnosis not present

## 2020-01-13 DIAGNOSIS — M25512 Pain in left shoulder: Secondary | ICD-10-CM | POA: Diagnosis not present

## 2020-01-13 DIAGNOSIS — M6281 Muscle weakness (generalized): Secondary | ICD-10-CM | POA: Diagnosis not present

## 2020-01-13 DIAGNOSIS — M25612 Stiffness of left shoulder, not elsewhere classified: Secondary | ICD-10-CM | POA: Diagnosis not present

## 2020-01-13 DIAGNOSIS — M19012 Primary osteoarthritis, left shoulder: Secondary | ICD-10-CM | POA: Diagnosis not present

## 2020-01-21 DIAGNOSIS — M6281 Muscle weakness (generalized): Secondary | ICD-10-CM | POA: Diagnosis not present

## 2020-01-21 DIAGNOSIS — M25512 Pain in left shoulder: Secondary | ICD-10-CM | POA: Diagnosis not present

## 2020-01-21 DIAGNOSIS — M19012 Primary osteoarthritis, left shoulder: Secondary | ICD-10-CM | POA: Diagnosis not present

## 2020-01-21 DIAGNOSIS — M25612 Stiffness of left shoulder, not elsewhere classified: Secondary | ICD-10-CM | POA: Diagnosis not present

## 2020-01-27 DIAGNOSIS — M6281 Muscle weakness (generalized): Secondary | ICD-10-CM | POA: Diagnosis not present

## 2020-01-27 DIAGNOSIS — M25512 Pain in left shoulder: Secondary | ICD-10-CM | POA: Diagnosis not present

## 2020-01-27 DIAGNOSIS — M19012 Primary osteoarthritis, left shoulder: Secondary | ICD-10-CM | POA: Diagnosis not present

## 2020-01-27 DIAGNOSIS — M25612 Stiffness of left shoulder, not elsewhere classified: Secondary | ICD-10-CM | POA: Diagnosis not present

## 2020-02-03 ENCOUNTER — Ambulatory Visit (INDEPENDENT_AMBULATORY_CARE_PROVIDER_SITE_OTHER): Payer: Medicare Other | Admitting: *Deleted

## 2020-02-03 DIAGNOSIS — I428 Other cardiomyopathies: Secondary | ICD-10-CM

## 2020-02-03 LAB — CUP PACEART REMOTE DEVICE CHECK
Battery Remaining Longevity: 12 mo
Battery Remaining Percentage: 12 %
Battery Voltage: 2.72 V
Brady Statistic AP VP Percent: 44 %
Brady Statistic AP VS Percent: 1 %
Brady Statistic AS VP Percent: 56 %
Brady Statistic AS VS Percent: 1 %
Brady Statistic RA Percent Paced: 44 %
Date Time Interrogation Session: 20210526050224
Implantable Lead Implant Date: 20010807
Implantable Lead Implant Date: 20010807
Implantable Lead Implant Date: 20140307
Implantable Lead Location: 753858
Implantable Lead Location: 753859
Implantable Lead Location: 753860
Implantable Pulse Generator Implant Date: 20140307
Lead Channel Impedance Value: 260 Ohm
Lead Channel Impedance Value: 340 Ohm
Lead Channel Impedance Value: 610 Ohm
Lead Channel Pacing Threshold Amplitude: 0.5 V
Lead Channel Pacing Threshold Amplitude: 0.625 V
Lead Channel Pacing Threshold Amplitude: 0.875 V
Lead Channel Pacing Threshold Pulse Width: 0.4 ms
Lead Channel Pacing Threshold Pulse Width: 0.4 ms
Lead Channel Pacing Threshold Pulse Width: 0.5 ms
Lead Channel Sensing Intrinsic Amplitude: 10.8 mV
Lead Channel Sensing Intrinsic Amplitude: 3.1 mV
Lead Channel Setting Pacing Amplitude: 2 V
Lead Channel Setting Pacing Amplitude: 2 V
Lead Channel Setting Pacing Amplitude: 2 V
Lead Channel Setting Pacing Pulse Width: 0.4 ms
Lead Channel Setting Pacing Pulse Width: 0.5 ms
Lead Channel Setting Sensing Sensitivity: 5 mV
Pulse Gen Model: 3210
Pulse Gen Serial Number: 2882239

## 2020-02-04 NOTE — Progress Notes (Signed)
Remote pacemaker transmission.   

## 2020-02-10 ENCOUNTER — Telehealth: Payer: Self-pay | Admitting: Internal Medicine

## 2020-02-10 DIAGNOSIS — I480 Paroxysmal atrial fibrillation: Secondary | ICD-10-CM

## 2020-02-10 MED ORDER — APIXABAN 5 MG PO TABS: 5.0000 mg | ORAL_TABLET | Freq: Two times a day (BID) | ORAL | 1 refills | Status: DC

## 2020-02-10 NOTE — Telephone Encounter (Signed)
Pt last saw Francis Dowse, [place] on 05/12/19, last labs 06/17/19 Creat 1.20, age 69, weight 103.9kg, based on specified criteria pt is on appropriate dosage of Eliquis 5mg  BID.  Will refill rx.

## 2020-02-10 NOTE — Telephone Encounter (Signed)
 *  STAT* If patient is at the pharmacy, call can be transferred to refill team.   1. Which medications need to be refilled? (please list name of each medication and dose if known)   apixaban (ELIQUIS) 5 MG TABS tablet  2. Which pharmacy/location (including street and city if local pharmacy) is medication to be sent to?  [facility] - [place], [place] - [address] AT Portal to [facility] Sites  3. Do they need a 30 day or 90 day supply? 90 day  

## 2020-03-08 ENCOUNTER — Ambulatory Visit (HOSPITAL_COMMUNITY): Payer: Self-pay | Admitting: Dentistry

## 2020-03-08 ENCOUNTER — Encounter (HOSPITAL_COMMUNITY): Payer: Self-pay | Admitting: Dentistry

## 2020-03-08 ENCOUNTER — Other Ambulatory Visit: Payer: Self-pay

## 2020-03-08 VITALS — BP 142/77 | HR 60 | Temp 98.6°F

## 2020-03-08 DIAGNOSIS — K08109 Complete loss of teeth, unspecified cause, unspecified class: Secondary | ICD-10-CM

## 2020-03-08 DIAGNOSIS — Z463 Encounter for fitting and adjustment of dental prosthetic device: Secondary | ICD-10-CM

## 2020-03-08 DIAGNOSIS — Z9889 Other specified postprocedural states: Secondary | ICD-10-CM

## 2020-03-08 DIAGNOSIS — Z952 Presence of prosthetic heart valve: Secondary | ICD-10-CM

## 2020-03-08 DIAGNOSIS — Z972 Presence of dental prosthetic device (complete) (partial): Secondary | ICD-10-CM

## 2020-03-08 NOTE — Progress Notes (Signed)
PROGRESS NOTE:  03/08/2020  Patient:            Russell Martin Date of Birth:  07-01-1951 MRN:                [id number]   COVID 19 SCREENING: The patient does not symptoms concerning for COVID-19 infection (Including fever, chills, cough, or new SHORTNESS OF BREATH).   BP (!) 142/77 (BP Location: Left Arm)    Pulse 60    Temp 98.6 F (37 C)    CODYLEE PATIL is a 69 year old male that presents for periodic oral exam and evaluation of upper and lower complete dentures. Patient was last seen by Cardiology in September 2020.     Patient Active Problem List   Diagnosis Date Noted   Typical atrial flutter (HCC) 04/07/2018   S/P mitral valve repair 04/29/2013   S/P tricuspid valve repair 04/29/2013   MR (mitral regurgitation) 03/11/2013   Tricuspid valve regurgitation 03/11/2013   Mitral regurgitation 03/03/2013   Chronic systolic heart failure (HCC) 10/21/2012   Complete heart block (HCC) 10/21/2012   Essential hypertension 10/21/2012   Pacemaker 10/21/2012     Medical Hx Update:  Past Medical History:  Diagnosis Date   Cardiomyopathy ([facility]) 1.10.14   reduced EF 30-35%   CHB (complete heart block) ([facility])    a. Pacer initially placed ~2002.    CHF (congestive heart failure) ([facility])    a. NICM EF 35% - no CAD by cath 09/2012. b. s/p upgrade to Bi-V pacemaker 11/2012 (did not get ICD as Dr. Ladona Ridgel expects his EF will improve with bi-V pacing).   Dyspnea    2d Echo, EF 40-45%, 5.20.14   Hyperlipidemia    Hypertension    pt denies HTN   LBBB (left bundle branch block)    Myoview, LV enlarged   Mitral valve regurgitation    Pacemaker    st jude    greg taylor   RBBB    S/P mitral valve repair 04/29/2013   59mm Sorin Memo 3D ring annuloplasty via right mini thoracotomy   S/P tricuspid valve repair 04/29/2013   28 mm Edwards mc3 ring annuloplasty via right mini thoracotomy  .  ALLERGIES/ADVERSE DRUG REACTIONS: No Known Allergies  MEDICATIONS: Current  Outpatient Medications  Medication Sig Dispense Refill   amLODipine (NORVASC) 10 MG tablet Take 1 tablet (10 mg total) by mouth daily. 90 tablet 1   apixaban (ELIQUIS) 5 MG TABS tablet Take 1 tablet (5 mg total) by mouth 2 (two) times daily. 180 tablet 1   aspirin EC 81 MG tablet Take 1 tablet (81 mg total) by mouth daily.     BIOTIN PO Take by mouth daily.     carvedilol (COREG) 25 MG tablet Take 1 tablet (25 mg total) by mouth 2 (two) times daily. 180 tablet 1   cholecalciferol (VITAMIN D) 1000 units tablet Take 2,000 Units by mouth daily.     losartan (COZAAR) 25 MG tablet Take 1 tablet (25 mg total) by mouth daily. 90 tablet 1   Multiple Vitamin (MULTIVITAMIN WITH MINERALS) TABS Take 1 tablet by mouth daily.      niacin (NIASPAN) 500 MG CR tablet Take 1 tablet (500 mg total) by mouth daily. 90 tablet 2   simvastatin (ZOCOR) 80 MG tablet Take 1 tablet (80 mg total) by mouth at bedtime. 90 tablet 2   No current facility-administered medications for this visit.     C/C: Patient presents for denture recall.  Patient denies having any dental problems.  HPI:  VEASNA SANTIBANEZ was originally seen on 03/17/2013 as part of a pre-heart valve surgery dental protocol. Patient had remaining teeth extracted with alveoloplasty and pre-prosthetic surgery as indicated in the operating room on 03/20/2013. After adequate healing, the dentures were fabricated and inserted in on 06/18/2013. Patient has had multiple denture adjustment appointments since that time. The patient now presents for periodic oral examination and evaluation of the upper and lower complete dentures.  DENTAL EXAM: General: Patient is a tall, well-developed, well-nourished male in no acute distress. Vitals: BP (!) 142/77 (BP Location: Left Arm)    Pulse 60    Temp 98.6 F (37 C)  Extraoral Exam: There is no palpable lymphadenopathy. The patient denies acute TMJ symptoms. Intraoral  Exam: The patient has normal saliva. There is no  evidence of denture irritation erythema or other soft tissue lesions. Dentition: The patient is edentulous. Prosthodontic: The patient has an upper and lower complete dentures. These are stable and retentive. Pressure indicating paste was applied to dentures and they were adjusted as needed minimally. Calculus of the maxillary denture involving the molar teeth was removed with a scaler. The dentures were polished. Patient accepts results of the adjustment. Patient still indicates that the repair of the chip of the maxillary incisal is acceptable to him. "It looks more natural".  Patient refuses to have the dentures sent to the lab for repair of the maxillary anterior tooth. Occlusion:  The occlusion of the upper and lower complete dentures is acceptable.No adjustments were needed.The posterior dentureTeeth are worn.  Assessments: 1. The patient is edentulous. 2. The patient has acceptable upper and lower complete dentures. 3. The denture teeth are worn and may benefit from a rebase procedure with replacement of the posterior molar teeth.This can be accomplished by a new primary dentist that he will need since I will be retiring soon.  Plan:  1. Patient is to keep dentures out if sore spots arise. 2. Patient to use salt water rinses as needed to aid healing. 3. Patient to follow up with the dentist of his choice for evaluation for annual denture evaluation and possible rebase procedures with replacement of the posterior horn denture teeth as needed.      [name], DDS

## 2020-03-08 NOTE — Patient Instructions (Addendum)
Patient to follow-up with the new primary dentist of his choice for future annual denture recall.  Patient is aware that I will be retiring in the near future.  Patient to consider possible rebase procedures with replacement of posterior denture teeth as needed.  [name], DDS

## 2020-05-04 ENCOUNTER — Ambulatory Visit (INDEPENDENT_AMBULATORY_CARE_PROVIDER_SITE_OTHER): Payer: Medicare Other | Admitting: *Deleted

## 2020-05-04 DIAGNOSIS — I428 Other cardiomyopathies: Secondary | ICD-10-CM

## 2020-05-04 DIAGNOSIS — I442 Atrioventricular block, complete: Secondary | ICD-10-CM

## 2020-05-06 LAB — CUP PACEART REMOTE DEVICE CHECK
Battery Remaining Longevity: 8 mo
Battery Remaining Percentage: 8 %
Battery Voltage: 2.69 V
Brady Statistic AP VP Percent: 44 %
Brady Statistic AP VS Percent: 1 %
Brady Statistic AS VP Percent: 56 %
Brady Statistic AS VS Percent: 1 %
Brady Statistic RA Percent Paced: 43 %
Date Time Interrogation Session: 20210826193822
Implantable Lead Implant Date: 20010807
Implantable Lead Implant Date: 20010807
Implantable Lead Implant Date: 20140307
Implantable Lead Location: 753858
Implantable Lead Location: 753859
Implantable Lead Location: 753860
Implantable Pulse Generator Implant Date: 20140307
Lead Channel Impedance Value: 280 Ohm
Lead Channel Impedance Value: 340 Ohm
Lead Channel Impedance Value: 650 Ohm
Lead Channel Pacing Threshold Amplitude: 0.5 V
Lead Channel Pacing Threshold Amplitude: 0.75 V
Lead Channel Pacing Threshold Amplitude: 0.875 V
Lead Channel Pacing Threshold Pulse Width: 0.4 ms
Lead Channel Pacing Threshold Pulse Width: 0.4 ms
Lead Channel Pacing Threshold Pulse Width: 0.5 ms
Lead Channel Sensing Intrinsic Amplitude: 10.8 mV
Lead Channel Sensing Intrinsic Amplitude: 2.9 mV
Lead Channel Setting Pacing Amplitude: 2 V
Lead Channel Setting Pacing Amplitude: 2 V
Lead Channel Setting Pacing Amplitude: 2 V
Lead Channel Setting Pacing Pulse Width: 0.4 ms
Lead Channel Setting Pacing Pulse Width: 0.5 ms
Lead Channel Setting Sensing Sensitivity: 5 mV
Pulse Gen Model: 3210
Pulse Gen Serial Number: 2882239

## 2020-05-09 NOTE — Progress Notes (Signed)
Remote pacemaker transmission.   

## 2020-05-15 ENCOUNTER — Other Ambulatory Visit: Payer: Self-pay | Admitting: Physician Assistant

## 2020-05-17 ENCOUNTER — Other Ambulatory Visit: Payer: Self-pay

## 2020-05-17 DIAGNOSIS — I10 Essential (primary) hypertension: Secondary | ICD-10-CM

## 2020-05-17 MED ORDER — LOSARTAN POTASSIUM 25 MG PO TABS: 25.0000 mg | ORAL_TABLET | Freq: Every day | ORAL | 0 refills | Status: DC

## 2020-06-09 ENCOUNTER — Other Ambulatory Visit: Payer: Self-pay | Admitting: Internal Medicine

## 2020-08-03 ENCOUNTER — Ambulatory Visit (INDEPENDENT_AMBULATORY_CARE_PROVIDER_SITE_OTHER): Payer: Medicare Other

## 2020-08-03 DIAGNOSIS — I428 Other cardiomyopathies: Secondary | ICD-10-CM | POA: Diagnosis not present

## 2020-08-03 DIAGNOSIS — I442 Atrioventricular block, complete: Secondary | ICD-10-CM

## 2020-08-07 LAB — CUP PACEART REMOTE DEVICE CHECK
Battery Remaining Longevity: 1 mo
Battery Remaining Percentage: 1 %
Battery Voltage: 2.63 V
Brady Statistic AP VP Percent: 44 %
Brady Statistic AP VS Percent: 1 %
Brady Statistic AS VP Percent: 56 %
Brady Statistic AS VS Percent: 1 %
Brady Statistic RA Percent Paced: 43 %
Date Time Interrogation Session: 20211127210831
Implantable Lead Implant Date: 20010807
Implantable Lead Implant Date: 20010807
Implantable Lead Implant Date: 20140307
Implantable Lead Location: 753858
Implantable Lead Location: 753859
Implantable Lead Location: 753860
Implantable Pulse Generator Implant Date: 20140307
Lead Channel Impedance Value: 280 Ohm
Lead Channel Impedance Value: 350 Ohm
Lead Channel Impedance Value: 640 Ohm
Lead Channel Pacing Threshold Amplitude: 0.5 V
Lead Channel Pacing Threshold Amplitude: 0.625 V
Lead Channel Pacing Threshold Amplitude: 0.75 V
Lead Channel Pacing Threshold Pulse Width: 0.4 ms
Lead Channel Pacing Threshold Pulse Width: 0.4 ms
Lead Channel Pacing Threshold Pulse Width: 0.5 ms
Lead Channel Sensing Intrinsic Amplitude: 10.8 mV
Lead Channel Sensing Intrinsic Amplitude: 3.5 mV
Lead Channel Setting Pacing Amplitude: 2 V
Lead Channel Setting Pacing Amplitude: 2 V
Lead Channel Setting Pacing Amplitude: 2 V
Lead Channel Setting Pacing Pulse Width: 0.4 ms
Lead Channel Setting Pacing Pulse Width: 0.5 ms
Lead Channel Setting Sensing Sensitivity: 5 mV
Pulse Gen Model: 3210
Pulse Gen Serial Number: 2882239

## 2020-08-09 DIAGNOSIS — E7800 Pure hypercholesterolemia, unspecified: Secondary | ICD-10-CM | POA: Diagnosis not present

## 2020-08-09 DIAGNOSIS — N183 Chronic kidney disease, stage 3 unspecified: Secondary | ICD-10-CM | POA: Diagnosis not present

## 2020-08-09 DIAGNOSIS — I10 Essential (primary) hypertension: Secondary | ICD-10-CM | POA: Diagnosis not present

## 2020-08-11 NOTE — Progress Notes (Signed)
Remote pacemaker transmission.   

## 2020-08-12 ENCOUNTER — Other Ambulatory Visit: Payer: Self-pay | Admitting: Internal Medicine

## 2020-08-12 DIAGNOSIS — I480 Paroxysmal atrial fibrillation: Secondary | ICD-10-CM

## 2020-08-12 MED ORDER — APIXABAN 5 MG PO TABS: 5.0000 mg | ORAL_TABLET | Freq: Two times a day (BID) | ORAL | 0 refills | Status: DC

## 2020-08-12 MED ORDER — CARVEDILOL 25 MG PO TABS: 25.0000 mg | ORAL_TABLET | Freq: Two times a day (BID) | ORAL | 0 refills | Status: DC

## 2020-08-12 NOTE — Telephone Encounter (Signed)
Eliquis 5mg  refill request received. Patient is 69 years old, weight-103.9kg, Crea-1.30 on 07/31/2019 via [facility] from [facility], Diagnosis-Aflutter, and last seen by [facility] on 05/12/2019. Dose is appropriate based on dosing criteria. 90 day supply sent to mail order.  Pt has an appt with provider on 08/22/2020 and needs updated labs-will add labs on and schedule as they are needed to ensure dosing. Also, made an appt to ensure labs are done and added note to Renee's Appt.

## 2020-08-12 NOTE — Telephone Encounter (Signed)
Patient needs refills on his Eliquis, Carvedilol 90 day refills called to [facility]. Patient came into office because he was having trouble getting through to [place] on the phone.

## 2020-08-19 DIAGNOSIS — Z7901 Long term (current) use of anticoagulants: Secondary | ICD-10-CM | POA: Diagnosis not present

## 2020-08-19 DIAGNOSIS — Z950 Presence of cardiac pacemaker: Secondary | ICD-10-CM | POA: Diagnosis not present

## 2020-08-19 DIAGNOSIS — I10 Essential (primary) hypertension: Secondary | ICD-10-CM | POA: Diagnosis not present

## 2020-08-19 DIAGNOSIS — M109 Gout, unspecified: Secondary | ICD-10-CM | POA: Diagnosis not present

## 2020-08-19 DIAGNOSIS — E7800 Pure hypercholesterolemia, unspecified: Secondary | ICD-10-CM | POA: Diagnosis not present

## 2020-08-19 DIAGNOSIS — Z23 Encounter for immunization: Secondary | ICD-10-CM | POA: Diagnosis not present

## 2020-08-19 DIAGNOSIS — I429 Cardiomyopathy, unspecified: Secondary | ICD-10-CM | POA: Diagnosis not present

## 2020-08-19 DIAGNOSIS — R7303 Prediabetes: Secondary | ICD-10-CM | POA: Diagnosis not present

## 2020-08-19 DIAGNOSIS — Z0000 Encounter for general adult medical examination without abnormal findings: Secondary | ICD-10-CM | POA: Diagnosis not present

## 2020-08-19 DIAGNOSIS — R972 Elevated prostate specific antigen [PSA]: Secondary | ICD-10-CM | POA: Diagnosis not present

## 2020-08-21 NOTE — Progress Notes (Signed)
Cardiology Office Note Date:  08/21/2020  Patient ID:  Martin, Russell 11/30/1950, MRN [id number] PCP:  Renford Dills, MD  Electrophysiologist:  Dr. Ladona Ridgel    Chief Complaint:  annual EP visit  History of Present Illness: Russell Martin is a 69 y.o. male with history of VHD w/MV and TV repairs (2014), NICM,, chronic CHF (systolic) has had subsequent improvement in LVEF, Aflutter (ablated 03/2018),  CHB w/ PPM >> CRT-P, HTN, HLD.  He comes today to be seen for Dr. Ladona Ridgel, last sen by him 04/2018, at that time doing well, no changes were made to his medicines or device programming.  He did mention PAF and to continue his OAC.   I saw him 05/12/2019 He is doing well.  Denies any CP, palpitations or cardiac awareness, no SOB opr DOE>  Denies any exertional intolerances.  No dizziness, near syncope or syncope. He is tolerating Eliquis, no bleeding or signs of bleeding , asks about stopping it given her had the ablation last year. NO changes were made. Referred to BP clinic.  A lead with stable measurements, noted noise that he would briefly track. Device dependent, no programming changes were made.  He aw the BP clinic, BP near goal, no changes were made, recommended increased exercise. Echo noted LVEF 45-50%, , no WMA, LA/RA severely dilated, Moderate-severe mitral valve stenosis Echo was reviewed with Dr. Ladona Ridgel, recommened continued a/c. Was to have seen Dr. Ladona Ridgel in 18mo, though I dont see that he did.   TODAY  He is doing well, BP unusually elevated for him, has had a pretty chaotic day running around ding errands.  Home BP he reports are always "perfect". He denies any CP, palpitations or cardiac awareness, no dizziness, near syncope or syncope. He denies rest SO, no DOE, no symptoms of PND or orthopnea. No bleeding or signs of bleeding   Device information SJM CRT-P, RA/RV leads 04/16/00, LV/generator 11/14/12 Dependent Known A lead noise and infrequent A lead impedance  alerts   Past Medical History:  Diagnosis Date  . Cardiomyopathy (HCC) 1.10.14   reduced EF 30-35%  . CHB (complete heart block) (HCC)    a. Pacer initially placed ~2002.   Russell Martin CHF (congestive heart failure) (HCC)    a. NICM EF 35% - no CAD by cath 09/2012. b. s/p upgrade to Bi-V pacemaker 11/2012 (did not get ICD as Dr. Ladona Ridgel expects his EF will improve with bi-V pacing).  Russell Martin Dyspnea    2d Echo, EF 40-45%, 5.20.14  . Hyperlipidemia   . Hypertension    pt denies HTN  . LBBB (left bundle branch block)    Myoview, LV enlarged  . Mitral valve regurgitation   . Pacemaker    st jude    greg taylor  . RBBB   . S/P mitral valve repair 04/29/2013   28mm Sorin Memo 3D ring annuloplasty via right mini thoracotomy  . S/P tricuspid valve repair 04/29/2013   28 mm Edwards mc3 ring annuloplasty via right mini thoracotomy    Past Surgical History:  Procedure Laterality Date  . A-FLUTTER ABLATION N/A 04/07/2018   Procedure: A-FLUTTER ABLATION;  Surgeon: [name], MD;  Location: [facility] INVASIVE CV LAB;  Service: Cardiovascular;  Laterality: N/A;  . BI-VENTRICULAR PACEMAKER UPGRADE N/A 11/14/2012   Procedure: BI-VENTRICULAR PACEMAKER UPGRADE;  Surgeon: [name], MD;  Location: [facility] CATH LAB;  Service: Cardiovascular;  Laterality: N/A;  . CARDIAC CATHETERIZATION     clean  . INTRAOPERATIVE TRANSESOPHAGEAL  ECHOCARDIOGRAM N/A 04/29/2013   Procedure: INTRAOPERATIVE TRANSESOPHAGEAL ECHOCARDIOGRAM;  Surgeon: [name], MD;  Location: [facility] OR;  Service: Open Heart Surgery;  Laterality: N/A;  . LEFT AND RIGHT HEART CATHETERIZATION WITH CORONARY ANGIOGRAM N/A 09/19/2012   Procedure: LEFT AND RIGHT HEART CATHETERIZATION WITH CORONARY ANGIOGRAM;  Surgeon: [name], MD;  Location: [facility] CATH LAB;  Service: Cardiovascular;  Laterality: N/A;  . MINIMALLY INVASIVE TRICUSPID VALVE REPAIR Right 04/29/2013   Procedure: MINIMALLY INVASIVE TRICUSPID VALVE REPAIR;  Surgeon: [name], MD;  Location: [facility] OR;   Service: Open Heart Surgery;  Laterality: Right;  . MITRAL VALVE REPAIR Right 04/29/2013   Procedure: MINIMALLY INVASIVE MITRAL VALVE REPAIR (MVR);  Surgeon: [name], MD;  Location: [facility] OR;  Service: Open Heart Surgery;  Laterality: Right;  . MULTIPLE EXTRACTIONS WITH ALVEOLOPLASTY N/A 03/20/2013   Procedure: Extraction of tooth #'s 1,6-10,16,17, and 20-29 with alveoloplasty;  Surgeon: [name], DDS;  Location: [facility] OR;  Service: Oral Surgery;  Laterality: N/A;  . PACEMAKER PLACEMENT    . TEE WITHOUT CARDIOVERSION N/A 04/09/2013   Procedure: TRANSESOPHAGEAL ECHOCARDIOGRAM (TEE);  Surgeon: Dolores Patty, MD;  Location: [facility] ENDOSCOPY;  Service: Cardiovascular;  Laterality: N/A;    Current Outpatient Medications  Medication Sig Dispense Refill  . amLODipine (NORVASC) 10 MG tablet Take 1 tablet (10 mg total) by mouth daily. 90 tablet 1  . apixaban (ELIQUIS) 5 MG TABS tablet Take 1 tablet (5 mg total) by mouth 2 (two) times daily. 180 tablet 0  . aspirin EC 81 MG tablet Take 1 tablet (81 mg total) by mouth daily.    Russell Martin BIOTIN PO Take by mouth daily.    . carvedilol (COREG) 25 MG tablet Take 1 tablet (25 mg total) by mouth 2 (two) times daily. Pt has appt 12/13 and will get further refills at that visit 180 tablet 0  . cholecalciferol (VITAMIN D) 1000 units tablet Take 2,000 Units by mouth daily.    Russell Martin losartan (COZAAR) 25 MG tablet Take 1 tablet (25 mg total) by mouth daily. Please make overdue appt with Dr. Ladona Ridgel before anymore refills. 1st attempt 30 tablet 0  . Multiple Vitamin (MULTIVITAMIN WITH MINERALS) TABS Take 1 tablet by mouth daily.     . niacin (NIASPAN) 500 MG CR tablet Take 1 tablet (500 mg total) by mouth daily. 90 tablet 2  . simvastatin (ZOCOR) 80 MG tablet Take 1 tablet (80 mg total) by mouth at bedtime. 90 tablet 2   No current facility-administered medications for this visit.    Allergies:   Patient has no known allergies.   Social History:  The patient   reports that he has quit smoking. He has never used smokeless tobacco. He reports that he does not drink alcohol and does not use drugs.   Family History:  The patient's family history includes Diabetes in his brother; Heart disease in his brother; Hypertension in his brother and brother; Other in his father and mother.  ROS:  Please see the history of present illness.  All other systems are reviewed and otherwise negative.   PHYSICAL EXAM:  VS:  There were no vitals taken for this visit. BMI: There is no height or weight on file to calculate BMI. Well nourished, well developed, in no acute distress  HEENT: normocephalic, atraumatic  Neck: no JVD, carotid bruits or masses Cardiac:  RRR; no significant murmurs, no rubs, or gallops Lungs:  CTA b/l, no wheezing, rhonchi or rales  Abd:  soft, nontender MS: no deformity or atrophy Ext:  no edema  Skin: warm and dry, no rash Neuro:  No gross deficits appreciated Psych: euthymic mood, full affect  PPM site is stable, no tethering or discomfort   EKG:  Done today and reviewed by myself shows  SR/Vpaced, 69bpm   PPM interrogation done today and reviewed by myself:  Battery is near ERI, voltage is at 2.63 (ERI is 2.60 RA lead c/w noise episodes, measurements remain stable over the year RV/LV lead measurements as well, stable AMS noted, available EGMs available are reviewed, no true AFib No HVR episodes V paced at 40bpm >99%BP   05/15/2019: TTE IMPRESSIONS  1. The left ventricle has mildly reduced systolic function, with an  ejection fraction of 45-50%. The cavity size was normal. Left ventricular  diastolic function could not be evaluated due to mitral valve  replacement/repair. Indeterminate filling  pressures There is abnormal septal motion consistent with post-operative  status and abnormal septal motion consistent with left bundle branch  block. Left ventrical global hypokinesis without regional wall motion  abnormalities.   2. The right ventricle has normal systolic function. The cavity was  mildly enlarged. There is no increase in right ventricular wall thickness.  3. Left atrial size was severely dilated.  4. Right atrial size was severely dilated.  5. A annuloplasty valve is present in the mitral position.  6. Moderate-severe mitral valve stenosis. Mean gradient 13 mm Hg at 60  bpm. Calculated valve area 1 cm sq using the PHT method.  7. Status post tricuspid valve repair. valve is present in the tricuspid  position.  8. The tricuspid valve is myxomatous.  9. Mildly increased tricuspid valve gradients following annuloplasty  (mean gradient 2.5 mm Hg at heart rate 66 bpm).  10. The aortic valve is tricuspid. Mild thickening of the aortic valve.  Sclerosis without any evidence of stenosis of the aortic valve. Aortic  valve regurgitation is trivial by color flow Doppler.  11. The aorta is normal unless otherwise noted.  12. When compared to the prior study: since 2018, there is a slight  increase in mitral valve gradients and prolongation of the pressure half  time. There also appears to be a slight decrease in LVEF, which is  primarily reduced due to septal-lateral  dyssynchrony.    04/07/18: EPS/ablation, Dr. Ladona Ridgel CONCLUSIONS:  1. Isthmus-dependent right atrial flutter upon presentation.  2. Successful radiofrequency ablation of atrial flutter along the cavotricuspid isthmus with complete bidirectional isthmus block achieved using CARTO.  3. No inducible arrhythmias following ablation.  4. No early apparent complications   12/24/16: TTE Study Conclusions - Left ventricle: The cavity size was normal. Systolic function was   normal. The estimated ejection fraction was in the range of 55%   to 60%. Images were inadequate for LV wall motion assessment. - Aortic valve: Trileaflet; mildly thickened, mildly calcified   leaflets. There was mild regurgitation. - Mitral valve: S/P mitral valve  repair. There appears to be at   least moderate mitral stenosis with a mean MV gradient of   10-17mmHg and MVA calculated at 1.39cm2. There is no mitral   regurgitation. Valve area by pressure half-time: 1.39 cm^2. - Left atrium: The atrium was mildly dilated. - Right ventricle: The cavity size was moderately dilated. Wall   thickness was normal. Systolic function was mildly to moderately   reduced. - Impressions: Normal LVF, s/p MV repair with moderate mitral   stenosis (Mean MV gradient has increased from  to 10-65mmHg   since last study), mild LAE, moderate RVE.  Impressions: - Normal LVF, s/p MV repair with moderate mitral stenosis (Mean MV   gradient has increased from to 10-64mmHg since last study),   mild LAE, moderate RVE.     Recent Labs: No results found for requested labs within last 8760 hours.  No results found for requested labs within last 8760 hours.   CrCl cannot be calculated (Patient's most recent lab result is older than the maximum 21 days allowed.).   Wt Readings from Last 3 Encounters:  05/12/19 229 lb (103.9 kg)  05/05/18 226 lb 3.2 oz (102.6 kg)  04/07/18 225 lb (102.1 kg)     Other studies reviewed: Additional studies/records reviewed today include: summarized above  ASSESSMENT AND PLAN:  1. CRT-P     Known A lead noise,      2 prior low impedence alerts (pt alert for this is programmed off)     Stable impedance in comparison to last year      Nearing ERI He is non monthly battery checks A lead noise and hx of low impedence alerts Will have him see Dr. Ladona Ridgel in 6-8 weeks to discuss plans for A lead at time of gen change   2. PAF (?), h/o AFlutter ablation July 2019     CHA2DS2Vasc is 2, on Eliquis, appropriately dosed     KPN 08/19/20 Creat 1.18   3. HTN     Better at home   4. VHD     H/o TV and MV repairs     Last year's echo with mod-severe MS  5. NICM 6. Chronic CHF     On BB/ARB     Last echo last year  45-50%     No symptom, exam findings to suggest fluid OL   Plan annual echos    Disposition: as above, sooner if needed    Current medicines are reviewed at length with the patient today.  The patient did not have any concerns regarding medicines.  Norma Fredrickson, PA-C 08/21/2020 8:45 AM     [facility] [address] [place] [address] [phone] (office)  [phone] (fax)

## 2020-08-22 ENCOUNTER — Encounter: Payer: Self-pay | Admitting: Physician Assistant

## 2020-08-22 ENCOUNTER — Other Ambulatory Visit: Payer: Federal, State, Local not specified - PPO

## 2020-08-22 ENCOUNTER — Other Ambulatory Visit: Payer: Self-pay

## 2020-08-22 ENCOUNTER — Ambulatory Visit (INDEPENDENT_AMBULATORY_CARE_PROVIDER_SITE_OTHER): Payer: Medicare Other | Admitting: Physician Assistant

## 2020-08-22 VITALS — BP 154/88 | HR 69 | Ht 70.5 in | Wt 235.2 lb

## 2020-08-22 DIAGNOSIS — I5022 Chronic systolic (congestive) heart failure: Secondary | ICD-10-CM

## 2020-08-22 DIAGNOSIS — I442 Atrioventricular block, complete: Secondary | ICD-10-CM

## 2020-08-22 DIAGNOSIS — Z950 Presence of cardiac pacemaker: Secondary | ICD-10-CM | POA: Diagnosis not present

## 2020-08-22 DIAGNOSIS — I480 Paroxysmal atrial fibrillation: Secondary | ICD-10-CM

## 2020-08-22 DIAGNOSIS — Z9889 Other specified postprocedural states: Secondary | ICD-10-CM | POA: Diagnosis not present

## 2020-08-22 DIAGNOSIS — I428 Other cardiomyopathies: Secondary | ICD-10-CM | POA: Diagnosis not present

## 2020-08-22 NOTE — Patient Instructions (Addendum)
Medication Instructions:   Your physician recommends that you continue on your current medications as directed. Please refer to the Current Medication list given to you today.   *If you need a refill on your cardiac medications before your next appointment, please call your pharmacy*   Lab Work: NONE ORDERED  TODAY   If you have labs (blood work) drawn today and your tests are completely normal, you will receive your results only by: [name] MyChart Message (if you have MyChart) OR . A paper copy in the mail If you have any lab test that is abnormal or we need to change your treatment, we will call you to review the results.   Testing/Procedures: Your physician has requested that you have an echocardiogram. Echocardiography is a painless test that uses sound waves to create images of your heart. It provides your doctor with information about the size and shape of your heart and how well your heart's chambers and valves are working. This procedure takes approximately one hour. There are no restrictions for this procedure.    Follow-Up: At [facility], you and your health needs are our priority.  As part of our continuing mission to provide you with exceptional heart care, we have created designated Provider Care Teams.  These Care Teams include your primary Cardiologist (physician) and Advanced Practice Providers (APPs -  Physician Assistants and Nurse Practitioners) who all work together to provide you with the care you need, when you need it.  We recommend signing up for the patient portal called "MyChart".  Sign up information is provided on this After Visit Summary.  MyChart is used to connect with patients for Virtual Visits (Telemedicine).  Patients are able to view lab/test results, encounter notes, upcoming appointments, etc.  Non-urgent messages can be sent to your provider as well.   To learn more about what you can do with MyChart, go to [email or web address].    Your next  appointment:   6 week(s)  The format for your next appointment:   In Person  Provider:   You may see Dr. Ladona Ridgel  ONLY   Other Instructions:

## 2020-09-16 ENCOUNTER — Ambulatory Visit (HOSPITAL_COMMUNITY): Payer: Medicare Other | Attending: Cardiovascular Disease

## 2020-09-16 ENCOUNTER — Other Ambulatory Visit: Payer: Self-pay

## 2020-09-16 DIAGNOSIS — I452 Bifascicular block: Secondary | ICD-10-CM | POA: Insufficient documentation

## 2020-09-16 DIAGNOSIS — I110 Hypertensive heart disease with heart failure: Secondary | ICD-10-CM | POA: Diagnosis not present

## 2020-09-16 DIAGNOSIS — Z9889 Other specified postprocedural states: Secondary | ICD-10-CM | POA: Diagnosis not present

## 2020-09-16 DIAGNOSIS — Z954 Presence of other heart-valve replacement: Secondary | ICD-10-CM | POA: Diagnosis not present

## 2020-09-16 DIAGNOSIS — I429 Cardiomyopathy, unspecified: Secondary | ICD-10-CM | POA: Insufficient documentation

## 2020-09-16 DIAGNOSIS — I351 Nonrheumatic aortic (valve) insufficiency: Secondary | ICD-10-CM | POA: Insufficient documentation

## 2020-09-16 DIAGNOSIS — E785 Hyperlipidemia, unspecified: Secondary | ICD-10-CM | POA: Insufficient documentation

## 2020-09-16 DIAGNOSIS — I509 Heart failure, unspecified: Secondary | ICD-10-CM | POA: Diagnosis not present

## 2020-09-16 DIAGNOSIS — Z950 Presence of cardiac pacemaker: Secondary | ICD-10-CM | POA: Insufficient documentation

## 2020-09-16 LAB — ECHOCARDIOGRAM COMPLETE
Area-P 1/2: 1.41 cm2
P 1/2 time: 491 msec
S' Lateral: 3.2 cm

## 2020-10-03 ENCOUNTER — Ambulatory Visit (INDEPENDENT_AMBULATORY_CARE_PROVIDER_SITE_OTHER): Payer: Federal, State, Local not specified - PPO

## 2020-10-03 ENCOUNTER — Encounter: Payer: Self-pay | Admitting: Internal Medicine

## 2020-10-03 ENCOUNTER — Other Ambulatory Visit: Payer: Self-pay

## 2020-10-03 ENCOUNTER — Ambulatory Visit (INDEPENDENT_AMBULATORY_CARE_PROVIDER_SITE_OTHER): Payer: Medicare Other | Admitting: Internal Medicine

## 2020-10-03 VITALS — BP 130/80 | HR 65 | Ht 70.5 in | Wt 239.8 lb

## 2020-10-03 DIAGNOSIS — I5022 Chronic systolic (congestive) heart failure: Secondary | ICD-10-CM | POA: Diagnosis not present

## 2020-10-03 DIAGNOSIS — I10 Essential (primary) hypertension: Secondary | ICD-10-CM

## 2020-10-03 DIAGNOSIS — I480 Paroxysmal atrial fibrillation: Secondary | ICD-10-CM | POA: Diagnosis not present

## 2020-10-03 DIAGNOSIS — I442 Atrioventricular block, complete: Secondary | ICD-10-CM

## 2020-10-03 DIAGNOSIS — I428 Other cardiomyopathies: Secondary | ICD-10-CM

## 2020-10-03 LAB — CUP PACEART REMOTE DEVICE CHECK
Battery Remaining Longevity: 1 mo
Battery Remaining Percentage: 0.5 %
Battery Voltage: 2.6 V
Brady Statistic AP VP Percent: 35 %
Brady Statistic AP VS Percent: 1 %
Brady Statistic AS VP Percent: 65 %
Brady Statistic AS VS Percent: 1 %
Brady Statistic RA Percent Paced: 35 %
Date Time Interrogation Session: 20220124052942
Implantable Lead Implant Date: 20010807
Implantable Lead Implant Date: 20010807
Implantable Lead Implant Date: 20140307
Implantable Lead Location: 753858
Implantable Lead Location: 753859
Implantable Lead Location: 753860
Implantable Pulse Generator Implant Date: 20140307
Lead Channel Impedance Value: 280 Ohm
Lead Channel Impedance Value: 380 Ohm
Lead Channel Impedance Value: 650 Ohm
Lead Channel Pacing Threshold Amplitude: 0.625 V
Lead Channel Pacing Threshold Amplitude: 0.75 V
Lead Channel Pacing Threshold Amplitude: 0.875 V
Lead Channel Pacing Threshold Pulse Width: 0.4 ms
Lead Channel Pacing Threshold Pulse Width: 0.4 ms
Lead Channel Pacing Threshold Pulse Width: 0.5 ms
Lead Channel Sensing Intrinsic Amplitude: 10.8 mV
Lead Channel Sensing Intrinsic Amplitude: 3.4 mV
Lead Channel Setting Pacing Amplitude: 2 V
Lead Channel Setting Pacing Amplitude: 2 V
Lead Channel Setting Pacing Amplitude: 2 V
Lead Channel Setting Pacing Pulse Width: 0.4 ms
Lead Channel Setting Pacing Pulse Width: 0.5 ms
Lead Channel Setting Sensing Sensitivity: 5 mV
Pulse Gen Model: 3210
Pulse Gen Serial Number: 2882239

## 2020-10-03 LAB — CUP PACEART INCLINIC DEVICE CHECK
Battery Remaining Longevity: 0 mo
Battery Voltage: 2.6 V
Brady Statistic RA Percent Paced: 35 %
Brady Statistic RV Percent Paced: 99.97 %
Date Time Interrogation Session: 20220124084536
Implantable Lead Implant Date: 20010807
Implantable Lead Implant Date: 20010807
Implantable Lead Implant Date: 20140307
Implantable Lead Location: 753858
Implantable Lead Location: 753859
Implantable Lead Location: 753860
Implantable Pulse Generator Implant Date: 20140307
Lead Channel Impedance Value: 275 Ohm
Lead Channel Impedance Value: 312.5 Ohm
Lead Channel Impedance Value: 650 Ohm
Lead Channel Pacing Threshold Amplitude: 0.625 V
Lead Channel Pacing Threshold Amplitude: 0.75 V
Lead Channel Pacing Threshold Amplitude: 0.875 V
Lead Channel Pacing Threshold Pulse Width: 0.4 ms
Lead Channel Pacing Threshold Pulse Width: 0.4 ms
Lead Channel Pacing Threshold Pulse Width: 0.5 ms
Lead Channel Sensing Intrinsic Amplitude: 10.8 mV
Lead Channel Sensing Intrinsic Amplitude: 4 mV
Lead Channel Setting Pacing Amplitude: 2 V
Lead Channel Setting Pacing Amplitude: 2 V
Lead Channel Setting Pacing Amplitude: 2 V
Lead Channel Setting Pacing Pulse Width: 0.4 ms
Lead Channel Setting Pacing Pulse Width: 0.5 ms
Lead Channel Setting Sensing Sensitivity: 5 mV
Pulse Gen Model: 3210
Pulse Gen Serial Number: 2882239

## 2020-10-03 LAB — CBC WITH DIFFERENTIAL/PLATELET
Basophils Absolute: 0 10*3/uL (ref 0.0–0.2)
Basos: 1 %
EOS (ABSOLUTE): 0.3 10*3/uL (ref 0.0–0.4)
Eos: 4 %
Hematocrit: 45.5 % (ref 37.5–51.0)
Hemoglobin: 15.4 g/dL (ref 13.0–17.7)
Lymphocytes Absolute: 2.2 10*3/uL (ref 0.7–3.1)
Lymphs: 36 %
MCH: 27.8 pg (ref 26.6–33.0)
MCHC: 33.8 g/dL (ref 31.5–35.7)
MCV: 82 fL (ref 79–97)
Monocytes Absolute: 1 10*3/uL — ABNORMAL HIGH (ref 0.1–0.9)
Monocytes: 17 %
Neutrophils Absolute: 2.5 10*3/uL (ref 1.4–7.0)
Neutrophils: 42 %
Platelets: 197 10*3/uL (ref 150–450)
RBC: 5.53 x10E6/uL (ref 4.14–5.80)
RDW: 14.6 % (ref 11.6–15.4)
WBC: 6 10*3/uL (ref 3.4–10.8)

## 2020-10-03 LAB — BASIC METABOLIC PANEL
BUN/Creatinine Ratio: 13 (ref 10–24)
BUN: 16 mg/dL (ref 8–27)
CO2: 31 mmol/L — ABNORMAL HIGH (ref 20–29)
Calcium: 9.9 mg/dL (ref 8.6–10.2)
Chloride: 106 mmol/L (ref 96–106)
Creatinine, Ser: 1.21 mg/dL (ref 0.76–1.27)
GFR calc Af Amer: 71 mL/min/{1.73_m2} (ref >59)
GFR calc non Af Amer: 62 mL/min/{1.73_m2} (ref >59)
Glucose: 111 mg/dL — ABNORMAL HIGH (ref 65–99)
Potassium: 4.4 mmol/L (ref 3.5–5.2)
Sodium: 142 mmol/L (ref 134–144)

## 2020-10-03 MED ORDER — SIMVASTATIN 80 MG PO TABS: 80.0000 mg | ORAL_TABLET | Freq: Every day | ORAL | 3 refills | Status: DC

## 2020-10-03 MED ORDER — CARVEDILOL 25 MG PO TABS: 25.0000 mg | ORAL_TABLET | Freq: Two times a day (BID) | ORAL | 3 refills | Status: DC

## 2020-10-03 MED ORDER — LOSARTAN POTASSIUM 25 MG PO TABS: 25.0000 mg | ORAL_TABLET | Freq: Every day | ORAL | 3 refills | Status: DC

## 2020-10-03 MED ORDER — AMLODIPINE BESYLATE 10 MG PO TABS: 10.0000 mg | ORAL_TABLET | Freq: Every day | ORAL | 3 refills | Status: DC

## 2020-10-03 MED ORDER — APIXABAN 5 MG PO TABS: 5.0000 mg | ORAL_TABLET | Freq: Two times a day (BID) | ORAL | 3 refills | Status: DC

## 2020-10-03 NOTE — H&P (View-Only) (Signed)
HPI Mr. Beckford returns today for evaluation. He is a pleasant 70 yo man with a h/o chronic systolic heart failure, s/p biv PPM insertion, who has chronic systolic heart failure, and CHB. He denies chest pain but has felt some palpitations. He has not had syncope. His dyspnea has worsened some. He has a h/o MV repair and improvement of his LV function after undergoing biv upgrade. He has not had syncope. Minimal edema. No Known Allergies         Current Outpatient Medications  Medication Sig Dispense Refill  . apixaban (ELIQUIS) 5 MG TABS tablet Take 1 tablet (5 mg total) 2 (two) times daily by mouth. 180 tablet 3  . aspirin EC 81 MG tablet Take 1 tablet (81 mg total) by mouth daily.    . carvedilol (COREG) 12.5 MG tablet Take 1 tablet (12.5 mg total) by mouth 2 (two) times daily with a meal. 60 tablet 11  . cholecalciferol (VITAMIN D) 1000 units tablet Take 2,000 Units by mouth daily.    [name] losartan (COZAAR) 50 MG tablet Take 1 tablet (50 mg total) by mouth daily. 90 tablet 3  . Multiple Vitamin (MULTIVITAMIN WITH MINERALS) TABS Take 1 tablet by mouth daily.     . niacin (NIASPAN) 500 MG CR tablet Take 1 tablet (500 mg total) by mouth daily. 90 tablet 3  . simvastatin (ZOCOR) 80 MG tablet Take 1 tablet (80 mg total) by mouth at bedtime. 90 tablet 3   No current facility-administered medications for this visit.          Past Medical History:  Diagnosis Date  . Cardiomyopathy (HCC) 1.10.14   reduced EF 30-35%  . CHB (complete heart block) (HCC)    a. Pacer initially placed ~2002.   [name] CHF (congestive heart failure) (HCC)    a. NICM EF 35% - no CAD by cath 09/2012. b. s/p upgrade to Bi-V pacemaker 11/2012 (did not get ICD as Dr. Ladona Ridgel expects his EF will improve with bi-V pacing).  [name] Dyspnea    2d Echo, EF 40-45%, 5.20.14  . Hyperlipidemia   . Hypertension    pt denies HTN  . LBBB (left bundle branch block)    Myoview, LV enlarged  . Mitral valve  regurgitation   . Pacemaker    st jude    greg Ladaija Dimino  . RBBB   . S/P mitral valve repair 04/29/2013   28mm Sorin Memo 3D ring annuloplasty via right mini thoracotomy  . S/P tricuspid valve repair 04/29/2013   28 mm Edwards mc3 ring annuloplasty via right mini thoracotomy    ROS:   All systems reviewed and negative except as noted in the HPI.        Past Surgical History:  Procedure Laterality Date  . A-FLUTTER ABLATION N/A 04/07/2018   Procedure: A-FLUTTER ABLATION;  Surgeon: [name], MD;  Location: [facility] INVASIVE CV LAB;  Service: Cardiovascular;  Laterality: N/A;  . BI-VENTRICULAR PACEMAKER UPGRADE N/A 11/14/2012   Procedure: BI-VENTRICULAR PACEMAKER UPGRADE;  Surgeon: [name], MD;  Location: [facility] CATH LAB;  Service: Cardiovascular;  Laterality: N/A;  . CARDIAC CATHETERIZATION     clean  . INTRAOPERATIVE TRANSESOPHAGEAL ECHOCARDIOGRAM N/A 04/29/2013   Procedure: INTRAOPERATIVE TRANSESOPHAGEAL ECHOCARDIOGRAM;  Surgeon: [name], MD;  Location: [facility] OR;  Service: Open Heart Surgery;  Laterality: N/A;  . LEFT AND RIGHT HEART CATHETERIZATION WITH CORONARY ANGIOGRAM N/A 09/19/2012   Procedure: LEFT AND RIGHT HEART CATHETERIZATION WITH CORONARY ANGIOGRAM;  Surgeon: Lennette Biharihomas A Kelly, MD;  [facility];  Service: Cardiovascular;  Laterality: N/A;  . MINIMALLY INVASIVE TRICUSPID VALVE REPAIR Right 04/29/2013   Procedure: MINIMALLY INVASIVE TRICUSPID VALVE REPAIR;  Surgeon: Purcell Nailslarence H Owen, MD;  Location: [facility] OR;  Service: Open Heart Surgery;  Laterality: Right;  . MITRAL VALVE REPAIR Right 04/29/2013   Procedure: MINIMALLY INVASIVE MITRAL VALVE REPAIR (MVR);  Surgeon: Purcell Nailslarence H Owen, MD;  [facility];  Service: Open Heart Surgery;  Laterality: Right;  . MULTIPLE EXTRACTIONS WITH ALVEOLOPLASTY N/A 03/20/2013   Procedure: Extraction of tooth #'s 1,6,7,8,9,10,16,17,20,21,22,23,24,25,26,27,28, 29 with alveoloplasty;  Surgeon: Charlynne Panderonald F Kulinski, DDS;   Location: [facility] OR;  Service: Oral Surgery;  Laterality: N/A;  . PACEMAKER PLACEMENT    . TEE WITHOUT CARDIOVERSION N/A 04/09/2013   Procedure: TRANSESOPHAGEAL ECHOCARDIOGRAM (TEE);  Surgeon: Dolores Pattyaniel R Bensimhon, MD;  [facility];  Service: Cardiovascular;  Laterality: N/A;     History reviewed. No pertinent family history.   Social History        Socioeconomic History  . Marital status: Married    Spouse name: Not on file  . Number of children: 1  . Years of education: Not on file  . Highest education level: Not on file  Occupational History    Employer: bank note  Social Needs  . Financial resource strain: Not on file  . Food insecurity:    Worry: Not on file    Inability: Not on file  . Transportation needs:    Medical: Not on file    Non-medical: Not on file  Tobacco Use  . Smoking status: Former Games developermoker  . Smokeless tobacco: Never Used  Substance and Sexual Activity  . Alcohol use: No  . Drug use: No  . Sexual activity: Yes  Lifestyle  . Physical activity:    Days per week: Not on file    Minutes per session: Not on file  . Stress: Not on file  Relationships  . Social connections:    Talks on phone: Not on file    Gets together: Not on file    Attends religious service: Not on file    Active member of club or organization: Not on file    Attends meetings of clubs or organizations: Not on file    Relationship status: Not on file  . Intimate partner violence:    Fear of current or ex partner: Not on file    Emotionally abused: Not on file    Physically abused: Not on file    Forced sexual activity: Not on file  Other Topics Concern  . Not on file  Social History Narrative   Patient is married. Patient has one grown son that lives in [place].   Patient with a history of smoking but quit approximately 10 years ago. Patient denies use of alcohol or other illicit drugs. Patient has never used smokeless  tobacco.     BP (!) 146/92   Pulse 63   Ht 5' 10.5" (1.791 m)   Wt 226 lb 3.2 oz (102.6 kg)   SpO2 97%   BMI 32.00 kg/m   Physical Exam:  Well appearing 70 yo man, NAD HEENT: Unremarkable Neck:  7 cm JVD, no thyromegally Lymphatics:  No adenopathy Back:  No CVA tenderness Lungs:  Clear HEART:  Regular rate rhythm, no murmurs, no rubs, no clicks Abd:  soft, positive bowel sounds, no organomegally, no rebound, no guarding Ext:  2 plus pulses, no edema, no cyanosis,  no clubbing Skin:  No rashes no nodules Neuro:  CN II through XII intact, motor grossly intact  EKG - nsr with biv pacing  DEVICE  Normal device function.  See PaceArt for details.   Assess/Plan: 1. Chronic systolic heart failure - he is class 2. He will continue his current meds. 2. PAF - he is maintaining NSR. He will continue Eliquis. 3. CHB - he is asymptomatic. No change. 4. BIV PPM - he is at ERI voltage and will undergo PM gen change out next month. 5. Coags - he will hold his eliquis for 2 days prior to gen change. 6. Atrial lead noise - the risk/benefit of atrial lead revision were discussed. I do not think that the benefit outweighs the risk.   Tasheka Houseman,M.D.      Patient Instructions by Wiliam Ke, RN at 05/05/2018 4:15 PM  Author: Wiliam Ke, RN Author Type: Registered Nurse Filed: 05/05/2018 4:45 PM  Note Status: Addendum Sebastian Ache: Cosign Not Required Encounter Date: 05/05/2018  Editor: Wiliam Ke, RN (Registered Nurse)      Prior Versions: 1. Wiliam Ke, RN (Registered Nurse) at 05/05/2018 4:43 PM - Addendum   2. Wiliam Ke, RN (Registered Nurse) at 05/05/2018 4:41 PM - Signed  Medication Instructions:  Continue your current medications.  Labwork: None ordered.  Testing/Procedures: None ordered.  Follow-Up: Your physician wants you to follow-up in: one year with Dr. Ladona Ridgel.   You will receive a reminder letter in the mail two months in  advance. If you don't receive a letter, please call our office to schedule the follow-up appointment.  Remote monitoring is used to monitor your Pacemaker from home. This monitoring reduces the number of office visits required to check your device to one time per year. It allows [place] to keep an eye on the functioning of your device to ensure it is working properly. You are scheduled for a device check from home on 05/06/2018. You may send your transmission at any time that day. If you have a wireless device, the transmission will be sent automatically. After your physician reviews your transmission, you will receive a postcard with your next transmission date.  Any Other Special Instructions Will Be Listed Below (If Applicable).  If you need a refill on your cardiac medications before your next appointment, please call your pharmacy.          Instructions  Medication Instructions:  Continue your current medications.  Labwork: None ordered.  Testing/Procedures: None ordered.  Follow-Up: Your physician wants you to follow-up in: one year with Dr. Ladona Ridgel.   You will receive a reminder letter in the mail two months in advance. If you don't receive a letter, please call our office to schedule the follow-up appointment.  Remote monitoring is used to monitor your Pacemaker from home. This monitoring reduces the number of office visits required to check your device to one time per year. It allows [place] to keep an eye on the functioning of your device to ensure it is working properly. You are scheduled for a device check from home on 05/06/2018. You may send your transmission at any time that day. If you have a wireless device, the transmission will be sent automatically. After your physician reviews your transmission, you will receive a postcard with your next transmission date.  Any Other Special Instructions Will Be Listed Below (If Applicable).  If you need a refill on your cardiac  medications before your next appointment, please call  your pharmacy.          After Visit Summary (Printed 05/05/2018)   Communications     [facility] Provider CC Chart Rep sent to Renford Dills, MD    Media From this encounter Electronic signature on 05/05/2018 4:06 PM - E-signed   Communication Routing History  Recipient Method Sent by Date Sent  Renford Dills, MD Fax [name], MD 05/05/2018  Fax: [phone]  Phone: [phone]   No questionnaires available.              Orders Placed     CUP PACEART INCLINIC DEVICE CHECK     EKG 12-Lead     Pacemaker external - parameters    All Encounter Results   Medication Changes     None    Medication List   Visit Diagnoses     Complete heart block (HCC)     Biventricular cardiac pacemaker in situ     Essential hypertension     Chronic systolic heart failure (HCC)     Atrial flutter, unspecified type (HCC)    Problem List   Level of Service  Level of Service  PR OFFICE OUTPATIENT VISIT 15 MINUTES [74827]   All Charges for This Encounter  Code Description Service Date Service Provider Modifiers Qty  [phone] PR OFFICE OUTPATIENT VISIT 15 MINUTES 05/05/2018 [name], MD  1  [phone] PR ELECTROCARDIOGRAM, COMPLETE 05/05/2018 [name], MD  1  [phone] PR PROGRAM EVAL IMPLANTABLE IN PRSN MULTI LD PACER 05/05/2018 [name], MD  1   Encounter Status     No Known Allergies   Current Outpatient Medications  Medication Sig Dispense Refill  . amLODipine (NORVASC) 10 MG tablet Take 1 tablet (10 mg total) by mouth daily. 90 tablet 1  . apixaban (ELIQUIS) 5 MG TABS tablet Take 1 tablet (5 mg total) by mouth 2 (two) times daily. 180 tablet 0  . aspirin EC 81 MG tablet Take 1 tablet (81 mg total) by mouth daily.    [name] BIOTIN PO Take by mouth daily.    . carvedilol (COREG) 25 MG tablet Take 1 tablet (25 mg total) by mouth 2 (two) times daily. Pt has appt 12/13 and will get  further refills at that visit 180 tablet 0  . cholecalciferol (VITAMIN D) 1000 units tablet Take 2,000 Units by mouth daily.    [name] losartan (COZAAR) 25 MG tablet Take 1 tablet (25 mg total) by mouth daily. Please make overdue appt with Dr. Ladona Ridgel before anymore refills. 1st attempt 30 tablet 0  . Multiple Vitamin (MULTIVITAMIN WITH MINERALS) TABS Take 1 tablet by mouth daily.     . niacin (NIASPAN) 500 MG CR tablet Take 1 tablet (500 mg total) by mouth daily. 90 tablet 2  . simvastatin (ZOCOR) 80 MG tablet Take 1 tablet (80 mg total) by mouth at bedtime. 90 tablet 3   No current facility-administered medications for this visit.     Past Medical History:  Diagnosis Date  . Cardiomyopathy (HCC) 1.10.14   reduced EF 30-35%  . CHB (complete heart block) (HCC)    a. Pacer initially placed ~2002.   [name] CHF (congestive heart failure) (HCC)    a. NICM EF 35% - no CAD by cath 09/2012. b. s/p upgrade to Bi-V pacemaker 11/2012 (did not get ICD as Dr. Ladona Ridgel expects his EF will improve with bi-V pacing).  [name] Dyspnea    2d Echo, EF 40-45%, 5.20.14  . Hyperlipidemia   .  Hypertension    pt denies HTN  . LBBB (left bundle branch block)    Myoview, LV enlarged  . Mitral valve regurgitation   . Pacemaker    st jude    greg Beautiful Pensyl  . RBBB   . S/P mitral valve repair 04/29/2013   47mm Sorin Memo 3D ring annuloplasty via right mini thoracotomy  . S/P tricuspid valve repair 04/29/2013   28 mm Edwards mc3 ring annuloplasty via right mini thoracotomy    ROS:   All systems reviewed and negative except as noted in the HPI.   Past Surgical History:  Procedure Laterality Date  . A-FLUTTER ABLATION N/A 04/07/2018   Procedure: A-FLUTTER ABLATION;  Surgeon: [name], MD;  Location: [facility] INVASIVE CV LAB;  Service: Cardiovascular;  Laterality: N/A;  . BI-VENTRICULAR PACEMAKER UPGRADE N/A 11/14/2012   Procedure: BI-VENTRICULAR PACEMAKER UPGRADE;  Surgeon: [name], MD;  Location: [facility] CATH LAB;  Service:  Cardiovascular;  Laterality: N/A;  . CARDIAC CATHETERIZATION     clean  . INTRAOPERATIVE TRANSESOPHAGEAL ECHOCARDIOGRAM N/A 04/29/2013   Procedure: INTRAOPERATIVE TRANSESOPHAGEAL ECHOCARDIOGRAM;  Surgeon: [name], MD;  Location: [facility] OR;  Service: Open Heart Surgery;  Laterality: N/A;  . LEFT AND RIGHT HEART CATHETERIZATION WITH CORONARY ANGIOGRAM N/A 09/19/2012   Procedure: LEFT AND RIGHT HEART CATHETERIZATION WITH CORONARY ANGIOGRAM;  Surgeon: [name], MD;  Location: [facility] CATH LAB;  Service: Cardiovascular;  Laterality: N/A;  . MINIMALLY INVASIVE TRICUSPID VALVE REPAIR Right 04/29/2013   Procedure: MINIMALLY INVASIVE TRICUSPID VALVE REPAIR;  Surgeon: [name], MD;  Location: [facility] OR;  Service: Open Heart Surgery;  Laterality: Right;  . MITRAL VALVE REPAIR Right 04/29/2013   Procedure: MINIMALLY INVASIVE MITRAL VALVE REPAIR (MVR);  Surgeon: [name], MD;  Location: [facility] OR;  Service: Open Heart Surgery;  Laterality: Right;  . MULTIPLE EXTRACTIONS WITH ALVEOLOPLASTY N/A 03/20/2013   Procedure: Extraction of tooth #'s 1,6-10,16,17, and 20-29 with alveoloplasty;  Surgeon: [name], DDS;  Location: [facility] OR;  Service: Oral Surgery;  Laterality: N/A;  . PACEMAKER PLACEMENT    . TEE WITHOUT CARDIOVERSION N/A 04/09/2013   Procedure: TRANSESOPHAGEAL ECHOCARDIOGRAM (TEE);  Surgeon: Dolores Patty, MD;  Location: [facility] ENDOSCOPY;  Service: Cardiovascular;  Laterality: N/A;     Family History  Problem Relation Age of Onset  . Other Mother        died elderly of unknonw cause  . Other Father        died very young, unknown cause  . Heart disease Brother   . Hypertension Brother   . Diabetes Brother   . Hypertension Brother      Social History   Socioeconomic History  . Marital status: Married    Spouse name: Not on file  . Number of children: 1  . Years of education: Not on file  . Highest education level: Not on file  Occupational History    Employer: bank note   Tobacco Use  . Smoking status: Former Games developer  . Smokeless tobacco: Never Used  Substance and Sexual Activity  . Alcohol use: No  . Drug use: No  . Sexual activity: Yes  Other Topics Concern  . Not on file  Social History Narrative   Patient is married. Patient has one grown son and 4 grandsons that live in [place].   Patient with a history of smoking but quit approximately 10 years ago. Patient denies use of alcohol or other illicit drugs. Patient has never  used smokeless tobacco.   Social Determinants of Health   Financial Resource Strain: Not on file  Food Insecurity: Not on file  Transportation Needs: Not on file  Physical Activity: Not on file  Stress: Not on file  Social Connections: Not on file  Intimate Partner Violence: Not on file     BP 130/80   Pulse 65   Ht 5' 10.5" (1.791 m)   Wt 239 lb 12.8 oz (108.8 kg)   SpO2 98%   BMI 33.92 kg/m   Physical Exam:  Well appearing NAD HEENT: Unremarkable Neck:  No JVD, no thyromegally Lymphatics:  No adenopathy Back:  No CVA tenderness Lungs:  Clear HEART:  Regular rate rhythm, no murmurs, no rubs, no clicks Abd:  soft, positive bowel sounds, no organomegally, no rebound, no guarding Ext:  2 plus pulses, no edema, no cyanosis, no clubbing Skin:  No rashes no nodules Neuro:  CN II through XII intact, motor grossly intact  EKG  DEVICE  Normal device function.  See PaceArt for details.   Assess/Plan:

## 2020-10-03 NOTE — Addendum Note (Signed)
Addended by: Roney Mans A on: 10/03/2020 02:23 PM   Modules accepted: Orders

## 2020-10-03 NOTE — Patient Instructions (Addendum)
Medication Instructions:  Your physician recommends that you continue on your current medications as directed. Please refer to the Current Medication list given to you today.  Labwork: You will get lab work today:  BMP and CBC  Testing/Procedures: None ordered.  Follow-Up:  SEE INSTRUCTION LETTER  Any Other Special Instructions Will Be Listed Below (If Applicable).  If you need a refill on your cardiac medications before your next appointment, please call your pharmacy.    [employer] Medicine (26th ed., pp. [phone]). [place], [place]: Elsevier."> Clinical Cardiac Pacing, Defibrillation and Resynchronization Therapy (5th ed., pp. 251-269). [place], [place]: Elsevier.">  Pacemaker Battery Change  A pacemaker battery usually lasts 5-15 years (6-7 years on average). A few times a year, you may be asked to visit your health care provider to have a full evaluation of your pacemaker. When the battery is low, your pacemaker will be completely replaced. Most often, this procedure is simpler than the first surgery because the wires (leads) that connect the pacemaker to the heart are already in place. There are many things that affect how long a pacemaker battery will last, including:  The age of the pacemaker.  The number of leads you have(1, 2, or 3).  The use or workload of the pacemaker. If the pacemaker is helping the heart more often, the battery will not last as long.  Power (voltage) settings. Tell a health care provider about:  Any allergies you have.  All medicines you are taking, including vitamins, herbs, eye drops, creams, and over-the-counter medicines.  Any problems you or family members have had with anesthetic medicines.  Any blood disorders you have.  Any surgeries you have had, especially any surgeries you have had since your last pacemaker was placed.  Any medical conditions you have.  Whether you are pregnant or may be pregnant. What are the  risks? Generally, this is a safe procedure. However, problems may occur, including:  Bleeding.  Infection.  Nerve damage.  Allergic reaction to medicines.  Damage to the leads that go to the heart. What happens before the procedure? Staying hydrated Follow instructions from your health care provider about hydration, which may include:  Up to 2 hours before the procedure - you may continue to drink clear liquids, such as water, clear fruit juice, black coffee, and plain tea. Eating and drinking restrictions Follow instructions from your health care provider about eating and drinking restrictions, which may include:  8 hours before the procedure - stop eating heavy meals or foods, such as meat, fried foods, or fatty foods.  6 hours before the procedure - stop eating light meals or foods, such as toast or cereal.  6 hours before the procedure - stop drinking milk or drinks that contain milk.  2 hours before the procedure - stop drinking clear liquids. Medicines Ask your health care provider about:  Changing or stopping your regular medicines. This is especially important if you are taking diabetes medicines or blood thinners.  Taking medicines such as aspirin and ibuprofen. These medicines can thin your blood. Do not take these medicines unless your health care provider tells you to take them.  Taking over-the-counter medicines, vitamins, herbs, and supplements. General instructions  Ask your health care provider what steps will be taken to help prevent infection. These may include: ? Removing hair at the surgery site. ? Washing skin with a germ-killing soap. ? Receiving antibiotic medicine.  Plan to have someone take you home from the hospital or clinic.  If you will  be going home right after the procedure, plan to have someone with you for 24 hours. What happens during the procedure?  An IV will be inserted into one of your veins.  You will be given one or more of the  following: ? A medicine to help you relax (sedative). ? A medicine to numb the area where the pacemaker is located (local anesthetic).  Your health care provider will make an incision to reopen the pocket holding the pacemaker.  The old pacemaker will be disconnected from the leads.  The leads will be tested.  If needed, the leads will be replaced. If the leads are functioning properly, the new pacemaker will be connected to the existing leads.  A heart monitor and a pacemaker programmer will be used to make sure that the newly implanted pacemaker is working properly.  The incision site will be closed with stitches (sutures), adhesive strips, or skin glue.  A bandage (dressing) will be placed over the pacemaker site. The procedure may vary among health care providers and hospitals. What happens after the procedure?  Your blood pressure, heart rate, breathing rate, and blood oxygen level will be monitored until you leave the hospital or clinic.  You may be given antibiotics.  Your health care provider will tell you when your pacemaker will need to be tested again, or when to return to the office for removal of the dressing and sutures.  If you were given a sedative during the procedure, it can affect you for several hours. Do not drive or operate machinery until your health care provider says that it is safe.  You will be given a pacemaker identification card. This card lists the implant date, device model, and manufacturer of your pacemaker. Summary  A pacemaker battery usually lasts 5-15 years (6-7 years on average).  When the battery is low, your pacemaker will need to be replaced.  Most often, this procedure is simpler than the first surgery because the wires (leads) that connect the pacemaker to the heart are already in place.  Risks of this procedure include bleeding, infection, and allergic reactions to medicines. This information is not intended to replace advice given to  you by your health care provider. Make sure you discuss any questions you have with your health care provider. Document Revised: 07/30/2019 Document Reviewed: 07/30/2019 Elsevier Patient Education  2021 Elsevier Inc.   

## 2020-10-03 NOTE — Progress Notes (Signed)
HPI [name] returns today for evaluation. He is a pleasant 70 yo man with a h/o chronic systolic heart failure, s/p biv PPM insertion, who has chronic systolic heart failure, and CHB. He denies chest pain but has felt some palpitations. He has not had syncope. His dyspnea has worsened some. He has a h/o MV repair and improvement of his LV function after undergoing biv upgrade. He has not had syncope. Minimal edema. No Known Allergies         Current Outpatient Medications  Medication Sig Dispense Refill  . apixaban (ELIQUIS) 5 MG TABS tablet Take 1 tablet (5 mg total) 2 (two) times daily by mouth. 180 tablet 3  . aspirin EC 81 MG tablet Take 1 tablet (81 mg total) by mouth daily.    . carvedilol (COREG) 12.5 MG tablet Take 1 tablet (12.5 mg total) by mouth 2 (two) times daily with a meal. 60 tablet 11  . cholecalciferol (VITAMIN D) 1000 units tablet Take 2,000 Units by mouth daily.    [name] losartan (COZAAR) 50 MG tablet Take 1 tablet (50 mg total) by mouth daily. 90 tablet 3  . Multiple Vitamin (MULTIVITAMIN WITH MINERALS) TABS Take 1 tablet by mouth daily.     . niacin (NIASPAN) 500 MG CR tablet Take 1 tablet (500 mg total) by mouth daily. 90 tablet 3  . simvastatin (ZOCOR) 80 MG tablet Take 1 tablet (80 mg total) by mouth at bedtime. 90 tablet 3   No current facility-administered medications for this visit.          Past Medical History:  Diagnosis Date  . Cardiomyopathy (HCC) 1.10.14   reduced EF 30-35%  . CHB (complete heart block) (HCC)    a. Pacer initially placed ~2002.   [name] CHF (congestive heart failure) (HCC)    a. NICM EF 35% - no CAD by cath 09/2012. b. s/p upgrade to Bi-V pacemaker 11/2012 (did not get ICD as Dr. Ladona Ridgel expects his EF will improve with bi-V pacing).  [name] Dyspnea    2d Echo, EF 40-45%, 5.20.14  . Hyperlipidemia   . Hypertension    pt denies HTN  . LBBB (left bundle branch block)    Myoview, LV enlarged  . Mitral valve  regurgitation   . Pacemaker    st jude    greg Lakeisha Waldrop  . RBBB   . S/P mitral valve repair 04/29/2013   28mm Sorin Memo 3D ring annuloplasty via right mini thoracotomy  . S/P tricuspid valve repair 04/29/2013   28 mm Edwards mc3 ring annuloplasty via right mini thoracotomy    ROS:   All systems reviewed and negative except as noted in the HPI.        Past Surgical History:  Procedure Laterality Date  . A-FLUTTER ABLATION N/A 04/07/2018   Procedure: A-FLUTTER ABLATION;  Surgeon: [name], MD;  Location: [facility] INVASIVE CV LAB;  Service: Cardiovascular;  Laterality: N/A;  . BI-VENTRICULAR PACEMAKER UPGRADE N/A 11/14/2012   Procedure: BI-VENTRICULAR PACEMAKER UPGRADE;  Surgeon: [name], MD;  Location: [facility] CATH LAB;  Service: Cardiovascular;  Laterality: N/A;  . CARDIAC CATHETERIZATION     clean  . INTRAOPERATIVE TRANSESOPHAGEAL ECHOCARDIOGRAM N/A 04/29/2013   Procedure: INTRAOPERATIVE TRANSESOPHAGEAL ECHOCARDIOGRAM;  Surgeon: [name], MD;  Location: [facility] OR;  Service: Open Heart Surgery;  Laterality: N/A;  . LEFT AND RIGHT HEART CATHETERIZATION WITH CORONARY ANGIOGRAM N/A 09/19/2012   Procedure: LEFT AND RIGHT HEART CATHETERIZATION WITH CORONARY ANGIOGRAM;  Surgeon: Lennette Biharihomas A Kelly, MD;  [facility];  Service: Cardiovascular;  Laterality: N/A;  . MINIMALLY INVASIVE TRICUSPID VALVE REPAIR Right 04/29/2013   Procedure: MINIMALLY INVASIVE TRICUSPID VALVE REPAIR;  Surgeon: Purcell Nailslarence H Owen, MD;  Location: [facility] OR;  Service: Open Heart Surgery;  Laterality: Right;  . MITRAL VALVE REPAIR Right 04/29/2013   Procedure: MINIMALLY INVASIVE MITRAL VALVE REPAIR (MVR);  Surgeon: Purcell Nailslarence H Owen, MD;  [facility];  Service: Open Heart Surgery;  Laterality: Right;  . MULTIPLE EXTRACTIONS WITH ALVEOLOPLASTY N/A 03/20/2013   Procedure: Extraction of tooth #'s 1,6,7,8,9,10,16,17,20,21,22,23,24,25,26,27,28, 29 with alveoloplasty;  Surgeon: Charlynne Panderonald F Kulinski, DDS;   Location: [facility] OR;  Service: Oral Surgery;  Laterality: N/A;  . PACEMAKER PLACEMENT    . TEE WITHOUT CARDIOVERSION N/A 04/09/2013   Procedure: TRANSESOPHAGEAL ECHOCARDIOGRAM (TEE);  Surgeon: Dolores Pattyaniel R Bensimhon, MD;  [facility];  Service: Cardiovascular;  Laterality: N/A;     History reviewed. No pertinent family history.   Social History        Socioeconomic History  . Marital status: Married    Spouse name: Not on file  . Number of children: 1  . Years of education: Not on file  . Highest education level: Not on file  Occupational History    Employer: bank note  Social Needs  . Financial resource strain: Not on file  . Food insecurity:    Worry: Not on file    Inability: Not on file  . Transportation needs:    Medical: Not on file    Non-medical: Not on file  Tobacco Use  . Smoking status: Former Games developermoker  . Smokeless tobacco: Never Used  Substance and Sexual Activity  . Alcohol use: No  . Drug use: No  . Sexual activity: Yes  Lifestyle  . Physical activity:    Days per week: Not on file    Minutes per session: Not on file  . Stress: Not on file  Relationships  . Social connections:    Talks on phone: Not on file    Gets together: Not on file    Attends religious service: Not on file    Active member of club or organization: Not on file    Attends meetings of clubs or organizations: Not on file    Relationship status: Not on file  . Intimate partner violence:    Fear of current or ex partner: Not on file    Emotionally abused: Not on file    Physically abused: Not on file    Forced sexual activity: Not on file  Other Topics Concern  . Not on file  Social History Narrative   Patient is married. Patient has one grown son that lives in [place].   Patient with a history of smoking but quit approximately 10 years ago. Patient denies use of alcohol or other illicit drugs. Patient has never used smokeless  tobacco.     BP (!) 146/92   Pulse 63   Ht 5' 10.5" (1.791 m)   Wt 226 lb 3.2 oz (102.6 kg)   SpO2 97%   BMI 32.00 kg/m   Physical Exam:  Well appearing 70 yo man, NAD HEENT: Unremarkable Neck:  7 cm JVD, no thyromegally Lymphatics:  No adenopathy Back:  No CVA tenderness Lungs:  Clear HEART:  Regular rate rhythm, no murmurs, no rubs, no clicks Abd:  soft, positive bowel sounds, no organomegally, no rebound, no guarding Ext:  2 plus pulses, no edema, no cyanosis,  no clubbing Skin:  No rashes no nodules Neuro:  CN II through XII intact, motor grossly intact  EKG - nsr with biv pacing  DEVICE  Normal device function.  See PaceArt for details.   Assess/Plan: 1. Chronic systolic heart failure - he is class 2. He will continue his current meds. 2. PAF - he is maintaining NSR. He will continue Eliquis. 3. CHB - he is asymptomatic. No change. 4. BIV PPM - he is at ERI voltage and will undergo PM gen change out next month. 5. Coags - he will hold his eliquis for 2 days prior to gen change. 6. Atrial lead noise - the risk/benefit of atrial lead revision were discussed. I do not think that the benefit outweighs the risk.   Yesena Reaves,M.D.      Patient Instructions by Wiliam Ke, RN at 05/05/2018 4:15 PM  Author: Wiliam Ke, RN Author Type: Registered Nurse Filed: 05/05/2018 4:45 PM  Note Status: Addendum Sebastian Ache: Cosign Not Required Encounter Date: 05/05/2018  Editor: Wiliam Ke, RN (Registered Nurse)      Prior Versions: 1. Wiliam Ke, RN (Registered Nurse) at 05/05/2018 4:43 PM - Addendum   2. Wiliam Ke, RN (Registered Nurse) at 05/05/2018 4:41 PM - Signed  Medication Instructions:  Continue your current medications.  Labwork: None ordered.  Testing/Procedures: None ordered.  Follow-Up: Your physician wants you to follow-up in: one year with Dr. Ladona Ridgel.   You will receive a reminder letter in the mail two months in  advance. If you don't receive a letter, please call our office to schedule the follow-up appointment.  Remote monitoring is used to monitor your Pacemaker from home. This monitoring reduces the number of office visits required to check your device to one time per year. It allows [place] to keep an eye on the functioning of your device to ensure it is working properly. You are scheduled for a device check from home on 05/06/2018. You may send your transmission at any time that day. If you have a wireless device, the transmission will be sent automatically. After your physician reviews your transmission, you will receive a postcard with your next transmission date.  Any Other Special Instructions Will Be Listed Below (If Applicable).  If you need a refill on your cardiac medications before your next appointment, please call your pharmacy.          Instructions  Medication Instructions:  Continue your current medications.  Labwork: None ordered.  Testing/Procedures: None ordered.  Follow-Up: Your physician wants you to follow-up in: one year with Dr. Ladona Ridgel.   You will receive a reminder letter in the mail two months in advance. If you don't receive a letter, please call our office to schedule the follow-up appointment.  Remote monitoring is used to monitor your Pacemaker from home. This monitoring reduces the number of office visits required to check your device to one time per year. It allows [place] to keep an eye on the functioning of your device to ensure it is working properly. You are scheduled for a device check from home on 05/06/2018. You may send your transmission at any time that day. If you have a wireless device, the transmission will be sent automatically. After your physician reviews your transmission, you will receive a postcard with your next transmission date.  Any Other Special Instructions Will Be Listed Below (If Applicable).  If you need a refill on your cardiac  medications before your next appointment, please call  your pharmacy.          After Visit Summary (Printed 05/05/2018)   Communications     [facility] Provider CC Chart Rep sent to Renford Dills, MD    Media From this encounter Electronic signature on 05/05/2018 4:06 PM - E-signed   Communication Routing History  Recipient Method Sent by Date Sent  Renford Dills, MD Fax [name], MD 05/05/2018  Fax: [phone]  Phone: [phone]   No questionnaires available.              Orders Placed     CUP PACEART INCLINIC DEVICE CHECK     EKG 12-Lead     Pacemaker external - parameters    All Encounter Results   Medication Changes     None    Medication List   Visit Diagnoses     Complete heart block (HCC)     Biventricular cardiac pacemaker in situ     Essential hypertension     Chronic systolic heart failure (HCC)     Atrial flutter, unspecified type (HCC)    Problem List   Level of Service  Level of Service  PR OFFICE OUTPATIENT VISIT 15 MINUTES [74827]   All Charges for This Encounter  Code Description Service Date Service Provider Modifiers Qty  [phone] PR OFFICE OUTPATIENT VISIT 15 MINUTES 05/05/2018 [name], MD  1  [phone] PR ELECTROCARDIOGRAM, COMPLETE 05/05/2018 [name], MD  1  [phone] PR PROGRAM EVAL IMPLANTABLE IN PRSN MULTI LD PACER 05/05/2018 [name], MD  1   Encounter Status     No Known Allergies   Current Outpatient Medications  Medication Sig Dispense Refill  . amLODipine (NORVASC) 10 MG tablet Take 1 tablet (10 mg total) by mouth daily. 90 tablet 1  . apixaban (ELIQUIS) 5 MG TABS tablet Take 1 tablet (5 mg total) by mouth 2 (two) times daily. 180 tablet 0  . aspirin EC 81 MG tablet Take 1 tablet (81 mg total) by mouth daily.    [name] BIOTIN PO Take by mouth daily.    . carvedilol (COREG) 25 MG tablet Take 1 tablet (25 mg total) by mouth 2 (two) times daily. Pt has appt 12/13 and will get  further refills at that visit 180 tablet 0  . cholecalciferol (VITAMIN D) 1000 units tablet Take 2,000 Units by mouth daily.    [name] losartan (COZAAR) 25 MG tablet Take 1 tablet (25 mg total) by mouth daily. Please make overdue appt with Dr. Ladona Ridgel before anymore refills. 1st attempt 30 tablet 0  . Multiple Vitamin (MULTIVITAMIN WITH MINERALS) TABS Take 1 tablet by mouth daily.     . niacin (NIASPAN) 500 MG CR tablet Take 1 tablet (500 mg total) by mouth daily. 90 tablet 2  . simvastatin (ZOCOR) 80 MG tablet Take 1 tablet (80 mg total) by mouth at bedtime. 90 tablet 3   No current facility-administered medications for this visit.     Past Medical History:  Diagnosis Date  . Cardiomyopathy (HCC) 1.10.14   reduced EF 30-35%  . CHB (complete heart block) (HCC)    a. Pacer initially placed ~2002.   [name] CHF (congestive heart failure) (HCC)    a. NICM EF 35% - no CAD by cath 09/2012. b. s/p upgrade to Bi-V pacemaker 11/2012 (did not get ICD as Dr. Ladona Ridgel expects his EF will improve with bi-V pacing).  [name] Dyspnea    2d Echo, EF 40-45%, 5.20.14  . Hyperlipidemia   .  Hypertension    pt denies HTN  . LBBB (left bundle branch block)    Myoview, LV enlarged  . Mitral valve regurgitation   . Pacemaker    st jude    greg Srihith Aquilino  . RBBB   . S/P mitral valve repair 04/29/2013   47mm Sorin Memo 3D ring annuloplasty via right mini thoracotomy  . S/P tricuspid valve repair 04/29/2013   28 mm Edwards mc3 ring annuloplasty via right mini thoracotomy    ROS:   All systems reviewed and negative except as noted in the HPI.   Past Surgical History:  Procedure Laterality Date  . A-FLUTTER ABLATION N/A 04/07/2018   Procedure: A-FLUTTER ABLATION;  Surgeon: [name], MD;  Location: [facility] INVASIVE CV LAB;  Service: Cardiovascular;  Laterality: N/A;  . BI-VENTRICULAR PACEMAKER UPGRADE N/A 11/14/2012   Procedure: BI-VENTRICULAR PACEMAKER UPGRADE;  Surgeon: [name], MD;  Location: [facility] CATH LAB;  Service:  Cardiovascular;  Laterality: N/A;  . CARDIAC CATHETERIZATION     clean  . INTRAOPERATIVE TRANSESOPHAGEAL ECHOCARDIOGRAM N/A 04/29/2013   Procedure: INTRAOPERATIVE TRANSESOPHAGEAL ECHOCARDIOGRAM;  Surgeon: [name], MD;  Location: [facility] OR;  Service: Open Heart Surgery;  Laterality: N/A;  . LEFT AND RIGHT HEART CATHETERIZATION WITH CORONARY ANGIOGRAM N/A 09/19/2012   Procedure: LEFT AND RIGHT HEART CATHETERIZATION WITH CORONARY ANGIOGRAM;  Surgeon: [name], MD;  Location: [facility] CATH LAB;  Service: Cardiovascular;  Laterality: N/A;  . MINIMALLY INVASIVE TRICUSPID VALVE REPAIR Right 04/29/2013   Procedure: MINIMALLY INVASIVE TRICUSPID VALVE REPAIR;  Surgeon: [name], MD;  Location: [facility] OR;  Service: Open Heart Surgery;  Laterality: Right;  . MITRAL VALVE REPAIR Right 04/29/2013   Procedure: MINIMALLY INVASIVE MITRAL VALVE REPAIR (MVR);  Surgeon: [name], MD;  Location: [facility] OR;  Service: Open Heart Surgery;  Laterality: Right;  . MULTIPLE EXTRACTIONS WITH ALVEOLOPLASTY N/A 03/20/2013   Procedure: Extraction of tooth #'s 1,6-10,16,17, and 20-29 with alveoloplasty;  Surgeon: [name], DDS;  Location: [facility] OR;  Service: Oral Surgery;  Laterality: N/A;  . PACEMAKER PLACEMENT    . TEE WITHOUT CARDIOVERSION N/A 04/09/2013   Procedure: TRANSESOPHAGEAL ECHOCARDIOGRAM (TEE);  Surgeon: Dolores Patty, MD;  Location: [facility] ENDOSCOPY;  Service: Cardiovascular;  Laterality: N/A;     Family History  Problem Relation Age of Onset  . Other Mother        died elderly of unknonw cause  . Other Father        died very young, unknown cause  . Heart disease Brother   . Hypertension Brother   . Diabetes Brother   . Hypertension Brother      Social History   Socioeconomic History  . Marital status: Married    Spouse name: Not on file  . Number of children: 1  . Years of education: Not on file  . Highest education level: Not on file  Occupational History    Employer: bank note   Tobacco Use  . Smoking status: Former Games developer  . Smokeless tobacco: Never Used  Substance and Sexual Activity  . Alcohol use: No  . Drug use: No  . Sexual activity: Yes  Other Topics Concern  . Not on file  Social History Narrative   Patient is married. Patient has one grown son and 4 grandsons that live in [place].   Patient with a history of smoking but quit approximately 10 years ago. Patient denies use of alcohol or other illicit drugs. Patient has never  used smokeless tobacco.   Social Determinants of Health   Financial Resource Strain: Not on file  Food Insecurity: Not on file  Transportation Needs: Not on file  Physical Activity: Not on file  Stress: Not on file  Social Connections: Not on file  Intimate Partner Violence: Not on file     BP 130/80   Pulse 65   Ht 5' 10.5" (1.791 m)   Wt 239 lb 12.8 oz (108.8 kg)   SpO2 98%   BMI 33.92 kg/m   Physical Exam:  Well appearing NAD HEENT: Unremarkable Neck:  No JVD, no thyromegally Lymphatics:  No adenopathy Back:  No CVA tenderness Lungs:  Clear HEART:  Regular rate rhythm, no murmurs, no rubs, no clicks Abd:  soft, positive bowel sounds, no organomegally, no rebound, no guarding Ext:  2 plus pulses, no edema, no cyanosis, no clubbing Skin:  No rashes no nodules Neuro:  CN II through XII intact, motor grossly intact  EKG  DEVICE  Normal device function.  See PaceArt for details.   Assess/Plan:

## 2020-10-14 NOTE — Progress Notes (Signed)
Remote pacemaker transmission.   

## 2020-10-28 ENCOUNTER — Other Ambulatory Visit (HOSPITAL_COMMUNITY)
Admission: RE | Admit: 2020-10-28 | Discharge: 2020-10-28 | Disposition: A | Discharge status: Home / Self Care | Payer: Medicare Other | Source: Ambulatory Visit | Attending: Internal Medicine | Admitting: Internal Medicine

## 2020-10-28 ENCOUNTER — Telehealth: Payer: Self-pay

## 2020-10-28 DIAGNOSIS — Z01812 Encounter for preprocedural laboratory examination: Secondary | ICD-10-CM | POA: Insufficient documentation

## 2020-10-28 DIAGNOSIS — Z20822 Contact with and (suspected) exposure to covid-19: Secondary | ICD-10-CM | POA: Diagnosis not present

## 2020-10-28 LAB — SARS CORONAVIRUS 2 (TAT 6-24 HRS): SARS Coronavirus 2: NEGATIVE

## 2020-10-28 NOTE — Telephone Encounter (Signed)
Left VM for Pt advising to arrive for procedure on Monday 10/31/20 at 11:30 am instead of 9:30 am  Requested call back if any questions

## 2020-10-28 NOTE — Progress Notes (Signed)
Attempted to call patient regarding procedure instructions for Monday's procedure.  Left voicemail with the following items: Arrival time 0930 Nothing to eat or drink after midnight No meds AM of procedure Responsible person to drive you home and stay with you for 24 hrs Wash with special soap night before and morning of procedure If on anti-coagulant drug instructions Eliquis, last dose today

## 2020-10-28 NOTE — Telephone Encounter (Signed)
Spoke with wife.  Confirmed arrival time for Monday.

## 2020-10-31 ENCOUNTER — Ambulatory Visit (HOSPITAL_COMMUNITY)
Admission: RE | Admit: 2020-10-31 | Discharge: 2020-10-31 | Disposition: A | Discharge status: Home / Self Care | Payer: Medicare Other | Source: Ambulatory Visit | Attending: Internal Medicine | Admitting: Internal Medicine

## 2020-10-31 ENCOUNTER — Ambulatory Visit (HOSPITAL_COMMUNITY)
Admission: RE | Disposition: A | Discharge status: Home / Self Care | Payer: Self-pay | Source: Ambulatory Visit | Attending: Internal Medicine

## 2020-10-31 DIAGNOSIS — I5022 Chronic systolic (congestive) heart failure: Secondary | ICD-10-CM | POA: Insufficient documentation

## 2020-10-31 DIAGNOSIS — I110 Hypertensive heart disease with heart failure: Secondary | ICD-10-CM | POA: Diagnosis not present

## 2020-10-31 DIAGNOSIS — Z45010 Encounter for checking and testing of cardiac pacemaker pulse generator [battery]: Secondary | ICD-10-CM | POA: Diagnosis not present

## 2020-10-31 DIAGNOSIS — Z87891 Personal history of nicotine dependence: Secondary | ICD-10-CM | POA: Diagnosis not present

## 2020-10-31 DIAGNOSIS — Z7982 Long term (current) use of aspirin: Secondary | ICD-10-CM | POA: Insufficient documentation

## 2020-10-31 DIAGNOSIS — I4892 Unspecified atrial flutter: Secondary | ICD-10-CM | POA: Diagnosis not present

## 2020-10-31 DIAGNOSIS — Z79899 Other long term (current) drug therapy: Secondary | ICD-10-CM | POA: Insufficient documentation

## 2020-10-31 DIAGNOSIS — I442 Atrioventricular block, complete: Secondary | ICD-10-CM | POA: Insufficient documentation

## 2020-10-31 HISTORY — PX: BIV PACEMAKER GENERATOR CHANGEOUT: EP1198

## 2020-10-31 SURGERY — BIV PACEMAKER GENERATOR CHANGEOUT

## 2020-10-31 MED ORDER — LIDOCAINE HCL 1 % IJ SOLN
INTRAMUSCULAR | Status: AC
  Filled 2020-10-31: qty 60

## 2020-10-31 MED ORDER — SODIUM CHLORIDE 0.9 % IV SOLN
INTRAVENOUS | Status: AC
  Filled 2020-10-31: qty 2

## 2020-10-31 MED ORDER — SODIUM CHLORIDE 0.9 % IV SOLN
80.0000 mg | INTRAVENOUS | Status: AC
  Administered 2020-10-31: 80 mg

## 2020-10-31 MED ORDER — CHLORHEXIDINE GLUCONATE 4 % EX LIQD
4.0000 "application " | Freq: Once | CUTANEOUS | Status: DC
  Filled 2020-10-31: qty 60

## 2020-10-31 MED ORDER — FENTANYL CITRATE (PF) 100 MCG/2ML IJ SOLN
INTRAMUSCULAR | Status: AC
  Filled 2020-10-31: qty 2

## 2020-10-31 MED ORDER — POVIDONE-IODINE 10 % EX SWAB: 2.0000 "application " | Freq: Once | CUTANEOUS | Status: DC

## 2020-10-31 MED ORDER — CEFAZOLIN SODIUM-DEXTROSE 2-4 GM/100ML-% IV SOLN
INTRAVENOUS | Status: AC
  Filled 2020-10-31: qty 100

## 2020-10-31 MED ORDER — CEFAZOLIN SODIUM-DEXTROSE 2-4 GM/100ML-% IV SOLN
2.0000 g | INTRAVENOUS | Status: AC
  Administered 2020-10-31: 2 g via INTRAVENOUS

## 2020-10-31 MED ORDER — MIDAZOLAM HCL 5 MG/5ML IJ SOLN
INTRAMUSCULAR | Status: AC
  Filled 2020-10-31: qty 5

## 2020-10-31 MED ORDER — SODIUM CHLORIDE 0.9 % IV SOLN: INTRAVENOUS | Status: DC

## 2020-10-31 MED ORDER — MIDAZOLAM HCL 5 MG/5ML IJ SOLN
INTRAMUSCULAR | Status: DC | PRN
  Administered 2020-10-31: 1 mg via INTRAVENOUS
  Administered 2020-10-31: 2 mg via INTRAVENOUS
  Administered 2020-10-31: 1 mg via INTRAVENOUS

## 2020-10-31 MED ORDER — LIDOCAINE HCL (PF) 1 % IJ SOLN
INTRAMUSCULAR | Status: DC | PRN
  Administered 2020-10-31: 50 mL

## 2020-10-31 MED ORDER — FENTANYL CITRATE (PF) 100 MCG/2ML IJ SOLN
INTRAMUSCULAR | Status: DC | PRN
  Administered 2020-10-31 (×3): 25 ug via INTRAVENOUS

## 2020-10-31 SURGICAL SUPPLY — 5 items
CABLE SURGICAL S-101-97-12 (CABLE) ×2 IMPLANT
PACEMAKER ALLR CRT-P RF PM3222 (Pacemaker) ×1 IMPLANT
PAD PRO RADIOLUCENT 2001M-C (PAD) ×2 IMPLANT
PPM ALLURE CRT-P RF PM3222 (Pacemaker) ×2 IMPLANT
TRAY PACEMAKER INSERTION (PACKS) ×2 IMPLANT

## 2020-10-31 NOTE — Progress Notes (Signed)
Dr Taylor in and ok to d/c home 

## 2020-10-31 NOTE — Progress Notes (Signed)
Called Dr Ladona Ridgel for orders and no answer

## 2020-10-31 NOTE — Interval H&P Note (Signed)
History and Physical Interval Note:  10/31/2020 12:32 PM  Russell Martin  has presented today for surgery, with the diagnosis of eri.  The various methods of treatment have been discussed with the patient and family. After consideration of risks, benefits and other options for treatment, the patient has consented to  Procedure(s): BIV PACEMAKER GENERATOR CHANGEOUT (N/A) as a surgical intervention.  The patient's history has been reviewed, patient examined, no change in status, stable for surgery.  I have reviewed the patient's chart and labs.  Questions were answered to the patient's satisfaction.     [name]

## 2020-10-31 NOTE — Discharge Instructions (Signed)
Pacemaker Battery Change, Care After This sheet gives you information about how to care for yourself after your procedure. Your health care provider may also give you more specific instructions. If you have problems or questions, contact your health care provider. What can I expect after the procedure? After the procedure, it is common to have these symptoms at the site where the pacemaker was inserted:  Mild pain or soreness.  Slight bruising.  Some swelling over the incisions.  A slight bump over the skin where the device was placed (if it was implanted in the upper chest area). Sometimes, it is possible to feel the device under the skin. This is normal. Follow these instructions at home: Incision care  Keep the incision clean and dry for 2-3 days after the procedure or as told by your health care provider. It takes several weeks for the incision site to completely heal.  Do not remove the bandage (dressing) on your chest until told to do so by your health care provider.  Leave stitches (sutures), skin glue, or adhesive strips in place. These skin closures may need to stay in place for 2 weeks or longer. If adhesive strip edges start to loosen and curl up, you may trim the loose edges. Do not remove adhesive strips completely unless your health care provider tells you to do that.  Do not take baths, swim, or use a hot tub for 7-10 days or until your health care provider approves. Ask your health care provider if you may take showers. You may only be allowed to take sponge baths.  Pat the incision area dry with a clean towel. Do not rub the area. This may cause bleeding.  Check your incision area every day for signs of infection. Check for: ? More redness, swelling, or pain. ? Fluid or blood. ? Warmth. ? Pus or a bad smell.  Avoid putting pressure on the area where the pacemaker was placed. Women may want to place a small pad over the incision site to protect it from their bra strap.    Medicines  Take over-the-counter and prescription medicines only as told by your health care provider.  If you were prescribed an antibiotic medicine, take it as told by your health care provider. Do not stop taking the antibiotic even if you start to feel better. Activity  For the first 2 weeks, or as long as told by your health care provider: ? Avoid lifting your left arm higher than your shoulder. ? Be gentle when you move your arms over your head. It is okay to raise your arm to comb your hair. ? Avoid exercise or activities that take a lot of effort.  Ask your health care provider when it is okay to: ? Return to your normal activities. ? Return to work or school. ? Resume sexual activity.  If you were given a medicine to help you relax (sedative) during the procedure, it can affect you for several hours. Do not drive or operate machinery until your health care provider says that it is safe. General instructions  Do not use any products that contain nicotine or tobacco, such as cigarettes, e-cigarettes, and chewing tobacco. These can delay incision healing after surgery. If you need help quitting, ask your health care provider.  Always let all health care providers, including dentists, know about your pacemaker before you have any medical procedures or tests.  You may be shown how to transfer data from your pacemaker through the phone to your   health care provider.  Wear a medical ID bracelet or necklace stating that you have a pacemaker, and carry a pacemaker ID card with you at all times.  Avoid close and prolonged exposure to electrical devices that have strong magnetic fields. These include: ? Airport security checkpoints. When at the airport, let officials know that you have a pacemaker. Carry your pacemaker ID card. ? Metal detectors. If you must pass through a metal detector, walk through it quickly. Do not stop under the detector or stand near it.  When using your mobile  phone, hold it to the ear opposite the pacemaker. Do not leave your mobile phone in a pocket over the pacemaker.  Your pacemaker battery will last for 5-15 years. Your health care provider will do routine checks to know when the battery is starting to run down. When this happens, the pacemaker will need to be replaced.  Keep all follow-up visits as told by your health care provider. This is important. Contact a health care provider if:  You have pain at the incision site that is not relieved by medicines.  You have any of these signs of infection: ? More redness, swelling, or pain around your incision. ? Fluid or blood coming from your incision. ? Warmth coming from your incision. ? Pus or a bad smell coming from your incision. ? A fever.  You feel brief, occasional palpitations, light-headedness, or any symptoms that you think might be related to your heart. Get help right away if:  You have chest pain that is different from the pain at the pacemaker site.  You develop a red streak that extends above or below the incision site.  You have shortness of breath.  You have palpitations or an irregular heartbeat.  You have light-headedness that does not go away quickly.  You faint or have dizzy spells.  Your pulse suddenly drops or increases rapidly and does not return to normal.  You gain weight and your legs and ankles swell. Summary  After the procedure, it is common to have pain, soreness, and some swelling or bruising where the pacemaker was inserted.  Keep your incision clean and dry. Follow instructions from your health care provider about how to take care of your incision.  Check your incision every day for signs of infection, such as more pain or swelling, pus or a bad smell, warmth, or leaking fluid or blood.  Carry a pacemaker ID card with you at all times. This information is not intended to replace advice given to you by your health care provider. Make sure you  discuss any questions you have with your health care provider. Document Revised: 07/30/2019 Document Reviewed: 07/30/2019 Elsevier Patient Education  2021 Elsevier Inc.  

## 2020-11-01 ENCOUNTER — Encounter (HOSPITAL_COMMUNITY): Payer: Self-pay | Admitting: Internal Medicine

## 2020-11-01 MED FILL — Lidocaine HCl Local Inj 1%: INTRAMUSCULAR | Qty: 60 | Status: AC

## 2020-11-04 ENCOUNTER — Telehealth: Payer: Self-pay | Admitting: Internal Medicine

## 2020-11-04 NOTE — Telephone Encounter (Signed)
Patient stated that the nurse forgot to call in his Niacin 500 MG CR tablet. He would like for the medicine to be called in to [facility] or [facility] [address]. He said the other option is to pick the prescription up fn person and take it to [facility].

## 2020-11-07 MED ORDER — NIACIN ER (ANTIHYPERLIPIDEMIC) 500 MG PO TBCR: 500.0000 mg | EXTENDED_RELEASE_TABLET | Freq: Every day | ORAL | 3 refills | Status: DC

## 2020-11-07 NOTE — Telephone Encounter (Signed)
Prescription refilled as requested.

## 2020-11-07 NOTE — Addendum Note (Signed)
Addended by: Roney Mans A on: 11/07/2020 07:46 AM   Modules accepted: Orders

## 2020-11-10 ENCOUNTER — Other Ambulatory Visit: Payer: Self-pay

## 2020-11-10 ENCOUNTER — Ambulatory Visit (INDEPENDENT_AMBULATORY_CARE_PROVIDER_SITE_OTHER): Payer: Medicare Other | Admitting: Emergency Medicine

## 2020-11-10 DIAGNOSIS — I5022 Chronic systolic (congestive) heart failure: Secondary | ICD-10-CM | POA: Diagnosis not present

## 2020-11-10 DIAGNOSIS — I442 Atrioventricular block, complete: Secondary | ICD-10-CM

## 2020-11-10 LAB — CUP PACEART INCLINIC DEVICE CHECK
Battery Remaining Longevity: 91 mo
Battery Voltage: 3.05 V
Brady Statistic RA Percent Paced: 51 %
Brady Statistic RV Percent Paced: 99.98 %
Date Time Interrogation Session: 20220303092452
Implantable Lead Implant Date: 20010807
Implantable Lead Implant Date: 20010807
Implantable Lead Implant Date: 20140307
Implantable Lead Location: 753858
Implantable Lead Location: 753859
Implantable Lead Location: 753860
Implantable Pulse Generator Implant Date: 20220221
Lead Channel Impedance Value: 275 Ohm
Lead Channel Impedance Value: 325 Ohm
Lead Channel Impedance Value: 587.5 Ohm
Lead Channel Pacing Threshold Amplitude: 0.625 V
Lead Channel Pacing Threshold Amplitude: 0.75 V
Lead Channel Pacing Threshold Amplitude: 0.875 V
Lead Channel Pacing Threshold Pulse Width: 0.4 ms
Lead Channel Pacing Threshold Pulse Width: 0.4 ms
Lead Channel Pacing Threshold Pulse Width: 0.4 ms
Lead Channel Sensing Intrinsic Amplitude: 2.9 mV
Lead Channel Setting Pacing Amplitude: 2 V
Lead Channel Setting Pacing Amplitude: 2 V
Lead Channel Setting Pacing Amplitude: 2 V
Lead Channel Setting Pacing Pulse Width: 0.4 ms
Lead Channel Setting Pacing Pulse Width: 0.4 ms
Lead Channel Setting Sensing Sensitivity: 5 mV
Pulse Gen Model: 3222
Pulse Gen Serial Number: 9194565

## 2020-11-10 NOTE — Progress Notes (Signed)
Wound check appointment. Steri-strips removed. Wound without redness or edema. Incision edges approximated, wound well healed. Normal device function. Thresholds, and impedances consistent with implant measurements. Atrial sensing results noted slightly lower than implant, reviewed with Dr. Ladona Ridgel and adjusted Atrial sensitivity from 1.34mV to 1.86mv for 2:1 safety margin.  Device outputs programmed at appropriate safety margin for chronic leads.  Histogram distribution appropriate for patient and level of activity. No mode switches or high ventricular rates noted. Patient educated about wound care, arm mobility, lifting restrictions. Patient is enrolled in remote monitoring, next scheduled check 02/01/21, ROV with Dr. Ladona Ridgel on 01/31/21.

## 2020-12-07 DIAGNOSIS — M109 Gout, unspecified: Secondary | ICD-10-CM | POA: Diagnosis not present

## 2020-12-13 ENCOUNTER — Other Ambulatory Visit: Payer: Self-pay | Admitting: Podiatry

## 2020-12-13 ENCOUNTER — Ambulatory Visit (INDEPENDENT_AMBULATORY_CARE_PROVIDER_SITE_OTHER): Payer: Medicare Other | Admitting: Podiatry

## 2020-12-13 ENCOUNTER — Other Ambulatory Visit: Payer: Self-pay

## 2020-12-13 ENCOUNTER — Ambulatory Visit: Payer: Medicare Other

## 2020-12-13 DIAGNOSIS — M778 Other enthesopathies, not elsewhere classified: Secondary | ICD-10-CM

## 2020-12-13 DIAGNOSIS — I428 Other cardiomyopathies: Secondary | ICD-10-CM | POA: Insufficient documentation

## 2020-12-13 DIAGNOSIS — I429 Cardiomyopathy, unspecified: Secondary | ICD-10-CM | POA: Insufficient documentation

## 2020-12-13 DIAGNOSIS — M779 Enthesopathy, unspecified: Secondary | ICD-10-CM

## 2020-12-13 DIAGNOSIS — E7800 Pure hypercholesterolemia, unspecified: Secondary | ICD-10-CM | POA: Insufficient documentation

## 2020-12-13 DIAGNOSIS — M19079 Primary osteoarthritis, unspecified ankle and foot: Secondary | ICD-10-CM

## 2020-12-13 DIAGNOSIS — R001 Bradycardia, unspecified: Secondary | ICD-10-CM | POA: Insufficient documentation

## 2020-12-13 DIAGNOSIS — N183 Chronic kidney disease, stage 3 unspecified: Secondary | ICD-10-CM | POA: Insufficient documentation

## 2020-12-13 DIAGNOSIS — I420 Dilated cardiomyopathy: Secondary | ICD-10-CM | POA: Insufficient documentation

## 2020-12-13 DIAGNOSIS — R972 Elevated prostate specific antigen [PSA]: Secondary | ICD-10-CM | POA: Insufficient documentation

## 2020-12-13 DIAGNOSIS — K5730 Diverticulosis of large intestine without perforation or abscess without bleeding: Secondary | ICD-10-CM | POA: Insufficient documentation

## 2020-12-13 DIAGNOSIS — Z7901 Long term (current) use of anticoagulants: Secondary | ICD-10-CM | POA: Insufficient documentation

## 2020-12-13 DIAGNOSIS — M109 Gout, unspecified: Secondary | ICD-10-CM | POA: Insufficient documentation

## 2020-12-13 DIAGNOSIS — E785 Hyperlipidemia, unspecified: Secondary | ICD-10-CM | POA: Insufficient documentation

## 2020-12-13 DIAGNOSIS — R109 Unspecified abdominal pain: Secondary | ICD-10-CM | POA: Insufficient documentation

## 2020-12-13 MED ORDER — MELOXICAM 15 MG PO TABS: 15.0000 mg | ORAL_TABLET | Freq: Every day | ORAL | 0 refills | Status: DC

## 2020-12-13 NOTE — Progress Notes (Signed)
Subjective:  Patient ID: Russell Martin, male    DOB: 06/15/1951,  MRN: [id number] HPI No chief complaint on file.   70 y.o. male presents with the above complaint.   ROS: Denies fever chills nausea vomiting muscle aches pains calf pain back pain chest pain shortness of breath.  Past Medical History:  Diagnosis Date  . Cardiomyopathy (HCC) 1.10.14   reduced EF 30-35%  . CHB (complete heart block) (HCC)    a. Pacer initially placed ~2002.   [name] CHF (congestive heart failure) (HCC)    a. NICM EF 35% - no CAD by cath 09/2012. b. s/p upgrade to Bi-V pacemaker 11/2012 (did not get ICD as Dr. Ladona Ridgel expects his EF will improve with bi-V pacing).  [name] Dyspnea    2d Echo, EF 40-45%, 5.20.14  . Hyperlipidemia   . Hypertension    pt denies HTN  . LBBB (left bundle branch block)    Myoview, LV enlarged  . Mitral valve regurgitation   . Pacemaker    st jude    greg taylor  . RBBB   . S/P mitral valve repair 04/29/2013   84mm Sorin Memo 3D ring annuloplasty via right mini thoracotomy  . S/P tricuspid valve repair 04/29/2013   28 mm Edwards mc3 ring annuloplasty via right mini thoracotomy   Past Surgical History:  Procedure Laterality Date  . A-FLUTTER ABLATION N/A 04/07/2018   Procedure: A-FLUTTER ABLATION;  Surgeon: [name], MD;  Location[facility] INVASIVE CV LAB;  Service: Cardiovascular;  Laterality: N/A;  . BI-VENTRICULAR PACEMAKER UPGRADE N/A 11/14/2012   Procedure: BI-VENTRICULAR PACEMAKER UPGRADE;  Surgeon: [name], MD;  Location: [facility] CATH LAB;  Service: Cardiovascular;  Laterality: N/A;  . BIV PACEMAKER GENERATOR CHANGEOUT N/A 10/31/2020   Procedure: BIV PACEMAKER GENERATOR CHANGEOUT;  Surgeon: [name], MD;  Location: [facility] INVASIVE CV LAB;  Service: Cardiovascular;  Laterality: N/A;  . CARDIAC CATHETERIZATION     clean  . INTRAOPERATIVE TRANSESOPHAGEAL ECHOCARDIOGRAM N/A 04/29/2013   Procedure: INTRAOPERATIVE TRANSESOPHAGEAL ECHOCARDIOGRAM;  Surgeon: [name], MD;   Location: [facility] OR;  Service: Open Heart Surgery;  Laterality: N/A;  . LEFT AND RIGHT HEART CATHETERIZATION WITH CORONARY ANGIOGRAM N/A 09/19/2012   Procedure: LEFT AND RIGHT HEART CATHETERIZATION WITH CORONARY ANGIOGRAM;  Surgeon: [name], MD;  Location: [facility] CATH LAB;  Service: Cardiovascular;  Laterality: N/A;  . MINIMALLY INVASIVE TRICUSPID VALVE REPAIR Right 04/29/2013   Procedure: MINIMALLY INVASIVE TRICUSPID VALVE REPAIR;  Surgeon: [name], MD;  Location: [facility] OR;  Service: Open Heart Surgery;  Laterality: Right;  . MITRAL VALVE REPAIR Right 04/29/2013   Procedure: MINIMALLY INVASIVE MITRAL VALVE REPAIR (MVR);  Surgeon: [name], MD;  Location: [facility] OR;  Service: Open Heart Surgery;  Laterality: Right;  . MULTIPLE EXTRACTIONS WITH ALVEOLOPLASTY N/A 03/20/2013   Procedure: Extraction of tooth #'s 1,6-10,16,17, and 20-29 with alveoloplasty;  Surgeon: [name], DDS;  Location: [facility] OR;  Service: Oral Surgery;  Laterality: N/A;  . PACEMAKER PLACEMENT    . TEE WITHOUT CARDIOVERSION N/A 04/09/2013   Procedure: TRANSESOPHAGEAL ECHOCARDIOGRAM (TEE);  Surgeon: Dolores Patty, MD;  Location: [facility] ENDOSCOPY;  Service: Cardiovascular;  Laterality: N/A;    Current Outpatient Medications:  .  meloxicam (MOBIC) 15 MG tablet, Take 1 tablet (15 mg total) by mouth daily., Disp: 30 tablet, Rfl: 0 .  amLODipine (NORVASC) 10 MG tablet, Take 1 tablet (10 mg total) by mouth daily., Disp: 90 tablet, Rfl: 3 .  apixaban (  ELIQUIS) 5 MG TABS tablet, Take 1 tablet (5 mg total) by mouth 2 (two) times daily., Disp: 180 tablet, Rfl: 3 .  aspirin EC 81 MG tablet, Take 1 tablet (81 mg total) by mouth daily., Disp: , Rfl:  .  BIOTIN PO, Take by mouth daily., Disp: , Rfl:  .  BLACK CURRANT SEED OIL PO, Take 25 mg by mouth daily., Disp: , Rfl:  .  carvedilol (COREG) 25 MG tablet, Take 1 tablet (25 mg total) by mouth 2 (two) times daily., Disp: 180 tablet, Rfl: 3 .  cholecalciferol (VITAMIN D) 1000 units  tablet, Take 1,000 Units by mouth daily., Disp: , Rfl:  .  losartan (COZAAR) 25 MG tablet, Take 1 tablet (25 mg total) by mouth daily., Disp: 90 tablet, Rfl: 3 .  Multiple Vitamin (MULTIVITAMIN WITH MINERALS) TABS, Take 1 tablet by mouth daily. Liquid / one cap full, Disp: , Rfl:  .  niacin (NIASPAN) 500 MG CR tablet, Take 1 tablet (500 mg total) by mouth daily., Disp: 90 tablet, Rfl: 3 .  predniSONE (STERAPRED UNI-PAK 21 TAB) 10 MG (21) TBPK tablet, See admin instructions. follow package directions, Disp: , Rfl:  .  simvastatin (ZOCOR) 80 MG tablet, Take 1 tablet (80 mg total) by mouth at bedtime., Disp: 90 tablet, Rfl: 3 .  vitamin B-12 (CYANOCOBALAMIN) 1000 MCG tablet, Take 1,000 mcg by mouth daily., Disp: , Rfl:   No Known Allergies Review of Systems Objective:  There were no vitals filed for this visit.  General: Well developed, nourished, in no acute distress, alert and oriented x3   Dermatological: Skin is warm, dry and supple bilateral. Nails x 10 are well maintained; remaining integument appears unremarkable at this time. There are no open sores, no preulcerative lesions, no rash or signs of infection present.  Vascular: Dorsalis Pedis artery and Posterior Tibial artery pedal pulses are 2/4 bilateral with immedate capillary fill time. Pedal hair growth present. No varicosities and no lower extremity edema present bilateral.   Neruologic: Grossly intact via light touch bilateral. Vibratory intact via tuning fork bilateral. Protective threshold with Semmes Wienstein monofilament intact to all pedal sites bilateral. Patellar and Achilles deep tendon reflexes 2+ bilateral. No Babinski or clonus noted bilateral.   Musculoskeletal: No gross boney pedal deformities bilateral. No pain, crepitus, or limitation noted with foot and ankle range of motion bilateral. Muscular strength 5/5 in all groups tested bilateral.  No reproducible pain of the ankle joint or the subtalar joint bilaterally.  No  edema erythema cellulitis drainage or odor.  Gait: Unassisted, Nonantalgic.    Radiographs:  Radiographs taken today demonstrate 3 views bilateral ankles and foot consistent with rear foot osteoarthritic changes joint space narrowing subchondral sclerosis of the ankle and the subtalar joint.  Otherwise no acute findings are noted.  No fractures are seen.  Assessment & Plan:   Assessment: Osteoarthritis of the rear foot.  Plan: Discussed etiology pathology conservative versus surgical therapies at this point time offered him injection to these joints he declined.  I am going to put him on meloxicam 1 tablet daily for 2 to 4weeks.  Should he have any bleeding he is obviously stop this medicine quickly.  He will watch his blood pressure daily.  I did encourage the use of Voltaren gel once we discontinue the meloxicam.     Tollie Canada T. [place], [place]

## 2020-12-28 DIAGNOSIS — I10 Essential (primary) hypertension: Secondary | ICD-10-CM | POA: Diagnosis not present

## 2020-12-28 DIAGNOSIS — N183 Chronic kidney disease, stage 3 unspecified: Secondary | ICD-10-CM | POA: Diagnosis not present

## 2020-12-28 DIAGNOSIS — E7800 Pure hypercholesterolemia, unspecified: Secondary | ICD-10-CM | POA: Diagnosis not present

## 2021-01-10 ENCOUNTER — Other Ambulatory Visit: Payer: Self-pay | Admitting: Podiatry

## 2021-01-23 DIAGNOSIS — I10 Essential (primary) hypertension: Secondary | ICD-10-CM | POA: Diagnosis not present

## 2021-01-23 DIAGNOSIS — N183 Chronic kidney disease, stage 3 unspecified: Secondary | ICD-10-CM | POA: Diagnosis not present

## 2021-01-23 DIAGNOSIS — E7800 Pure hypercholesterolemia, unspecified: Secondary | ICD-10-CM | POA: Diagnosis not present

## 2021-01-31 ENCOUNTER — Encounter: Payer: Medicare Other | Admitting: Internal Medicine

## 2021-02-12 LAB — CUP PACEART REMOTE DEVICE CHECK
Battery Remaining Longevity: 96 mo
Battery Remaining Percentage: 95.5 %
Battery Voltage: 3.02 V
Brady Statistic AP VP Percent: 36 %
Brady Statistic AP VS Percent: 1 %
Brady Statistic AS VP Percent: 64 %
Brady Statistic AS VS Percent: 1 %
Brady Statistic RA Percent Paced: 35 %
Date Time Interrogation Session: 20220604010208
Implantable Lead Implant Date: 20010807
Implantable Lead Implant Date: 20010807
Implantable Lead Implant Date: 20140307
Implantable Lead Location: 753858
Implantable Lead Location: 753859
Implantable Lead Location: 753860
Implantable Pulse Generator Implant Date: 20220221
Lead Channel Impedance Value: 280 Ohm
Lead Channel Impedance Value: 310 Ohm
Lead Channel Impedance Value: 590 Ohm
Lead Channel Pacing Threshold Amplitude: 0.625 V
Lead Channel Pacing Threshold Amplitude: 0.75 V
Lead Channel Pacing Threshold Amplitude: 0.875 V
Lead Channel Pacing Threshold Pulse Width: 0.4 ms
Lead Channel Pacing Threshold Pulse Width: 0.4 ms
Lead Channel Pacing Threshold Pulse Width: 0.4 ms
Lead Channel Sensing Intrinsic Amplitude: 2.6 mV
Lead Channel Setting Pacing Amplitude: 2 V
Lead Channel Setting Pacing Amplitude: 2 V
Lead Channel Setting Pacing Amplitude: 2 V
Lead Channel Setting Pacing Pulse Width: 0.4 ms
Lead Channel Setting Pacing Pulse Width: 0.4 ms
Lead Channel Setting Sensing Sensitivity: 5 mV
Pulse Gen Model: 3222
Pulse Gen Serial Number: 9194565

## 2021-02-13 ENCOUNTER — Ambulatory Visit (INDEPENDENT_AMBULATORY_CARE_PROVIDER_SITE_OTHER): Payer: Medicare Other

## 2021-02-13 DIAGNOSIS — I429 Cardiomyopathy, unspecified: Secondary | ICD-10-CM

## 2021-02-13 DIAGNOSIS — I428 Other cardiomyopathies: Secondary | ICD-10-CM | POA: Diagnosis not present

## 2021-02-15 ENCOUNTER — Other Ambulatory Visit: Payer: Self-pay | Admitting: Podiatry

## 2021-03-07 NOTE — Progress Notes (Signed)
Remote pacemaker transmission.   

## 2021-03-30 DIAGNOSIS — N183 Chronic kidney disease, stage 3 unspecified: Secondary | ICD-10-CM | POA: Diagnosis not present

## 2021-03-30 DIAGNOSIS — I10 Essential (primary) hypertension: Secondary | ICD-10-CM | POA: Diagnosis not present

## 2021-03-30 DIAGNOSIS — E7800 Pure hypercholesterolemia, unspecified: Secondary | ICD-10-CM | POA: Diagnosis not present

## 2021-03-31 ENCOUNTER — Ambulatory Visit (INDEPENDENT_AMBULATORY_CARE_PROVIDER_SITE_OTHER): Payer: Medicare Other | Admitting: Internal Medicine

## 2021-03-31 ENCOUNTER — Other Ambulatory Visit: Payer: Self-pay

## 2021-03-31 ENCOUNTER — Encounter: Payer: Self-pay | Admitting: Internal Medicine

## 2021-03-31 VITALS — BP 134/72 | HR 65 | Ht 70.5 in | Wt 227.0 lb

## 2021-03-31 DIAGNOSIS — Z950 Presence of cardiac pacemaker: Secondary | ICD-10-CM

## 2021-03-31 DIAGNOSIS — I5022 Chronic systolic (congestive) heart failure: Secondary | ICD-10-CM

## 2021-03-31 DIAGNOSIS — I442 Atrioventricular block, complete: Secondary | ICD-10-CM

## 2021-03-31 DIAGNOSIS — I480 Paroxysmal atrial fibrillation: Secondary | ICD-10-CM | POA: Diagnosis not present

## 2021-03-31 NOTE — Progress Notes (Signed)
HPI [name] returns today for evaluation. He is a pleasant 70 yo man with a h/o chronic systolic heart failure, s/p biv PPM insertion, who has chronic systolic heart failure, and CHB. He denies chest pain but has felt some palpitations. He has not had syncope. His dyspnea has worsened some. He has a h/o MV repair and improvement of his LV function after undergoing biv upgrade. He has not had syncope. Minimal edema. He underwent gen change out several months ago with relocation of his pocket. He feels well and denies chest pain or sob.  No Known Allergies   Current Outpatient Medications  Medication Sig Dispense Refill   amLODipine (NORVASC) 10 MG tablet Take 1 tablet (10 mg total) by mouth daily. 90 tablet 3   apixaban (ELIQUIS) 5 MG TABS tablet Take 1 tablet (5 mg total) by mouth 2 (two) times daily. 180 tablet 3   aspirin EC 81 MG tablet Take 1 tablet (81 mg total) by mouth daily.     BIOTIN PO Take by mouth daily.     BLACK CURRANT SEED OIL PO Take 25 mg by mouth daily.     carvedilol (COREG) 25 MG tablet Take 1 tablet (25 mg total) by mouth 2 (two) times daily. 180 tablet 3   cholecalciferol (VITAMIN D) 1000 units tablet Take 1,000 Units by mouth daily.     losartan (COZAAR) 25 MG tablet Take 1 tablet (25 mg total) by mouth daily. 90 tablet 3   meloxicam (MOBIC) 15 MG tablet TAKE 1 TABLET(15 MG) BY MOUTH DAILY 30 tablet 0   Multiple Vitamin (MULTIVITAMIN WITH MINERALS) TABS Take 1 tablet by mouth daily. Liquid / one cap full     niacin (NIASPAN) 500 MG CR tablet Take 1 tablet (500 mg total) by mouth daily. 90 tablet 3   predniSONE (STERAPRED UNI-PAK 21 TAB) 10 MG (21) TBPK tablet See admin instructions. follow package directions     simvastatin (ZOCOR) 80 MG tablet Take 1 tablet (80 mg total) by mouth at bedtime. 90 tablet 3   vitamin B-12 (CYANOCOBALAMIN) 1000 MCG tablet Take 1,000 mcg by mouth daily.     No current facility-administered medications for this visit.     Past  Medical History:  Diagnosis Date   Cardiomyopathy (HCC) 1.10.14   reduced EF 30-35%   CHB (complete heart block) (HCC)    a. Pacer initially placed ~2002.    CHF (congestive heart failure) (HCC)    a. NICM EF 35% - no CAD by cath 09/2012. b. s/p upgrade to Bi-V pacemaker 11/2012 (did not get ICD as Dr. Ladona Ridgel expects his EF will improve with bi-V pacing).   Dyspnea    2d Echo, EF 40-45%, 5.20.14   Hyperlipidemia    Hypertension    pt denies HTN   LBBB (left bundle branch block)    Myoview, LV enlarged   Mitral valve regurgitation    Pacemaker    st jude    greg Blessin Kanno   RBBB    S/P mitral valve repair 04/29/2013   61mm Sorin Memo 3D ring annuloplasty via right mini thoracotomy   S/P tricuspid valve repair 04/29/2013   28 mm Edwards mc3 ring annuloplasty via right mini thoracotomy    ROS:   All systems reviewed and negative except as noted in the HPI.   Past Surgical History:  Procedure Laterality Date   A-FLUTTER ABLATION N/A 04/07/2018   Procedure: A-FLUTTER ABLATION;  Surgeon: [name], MD;  Location: [facility] INVASIVE CV LAB;  Service: Cardiovascular;  Laterality: N/A;   BI-VENTRICULAR PACEMAKER UPGRADE N/A 11/14/2012   Procedure: BI-VENTRICULAR PACEMAKER UPGRADE;  Surgeon: [name], MD;  Location: [facility] CATH LAB;  Service: Cardiovascular;  Laterality: N/A;   BIV PACEMAKER GENERATOR CHANGEOUT N/A 10/31/2020   Procedure: BIV PACEMAKER GENERATOR CHANGEOUT;  Surgeon: [name], MD;  Location: [facility] INVASIVE CV LAB;  Service: Cardiovascular;  Laterality: N/A;   CARDIAC CATHETERIZATION     clean   INTRAOPERATIVE TRANSESOPHAGEAL ECHOCARDIOGRAM N/A 04/29/2013   Procedure: INTRAOPERATIVE TRANSESOPHAGEAL ECHOCARDIOGRAM;  Surgeon: [name], MD;  Location: [facility] OR;  Service: Open Heart Surgery;  Laterality: N/A;   LEFT AND RIGHT HEART CATHETERIZATION WITH CORONARY ANGIOGRAM N/A 09/19/2012   Procedure: LEFT AND RIGHT HEART CATHETERIZATION WITH CORONARY ANGIOGRAM;  Surgeon: [name], MD;  Location: [facility] CATH LAB;  Service: Cardiovascular;  Laterality: N/A;   MINIMALLY INVASIVE TRICUSPID VALVE REPAIR Right 04/29/2013   Procedure: MINIMALLY INVASIVE TRICUSPID VALVE REPAIR;  Surgeon: [name], MD;  Location: [facility] OR;  Service: Open Heart Surgery;  Laterality: Right;   MITRAL VALVE REPAIR Right 04/29/2013   Procedure: MINIMALLY INVASIVE MITRAL VALVE REPAIR (MVR);  Surgeon: [name], MD;  Location: [facility] OR;  Service: Open Heart Surgery;  Laterality: Right;   MULTIPLE EXTRACTIONS WITH ALVEOLOPLASTY N/A 03/20/2013   Procedure: Extraction of tooth #'s 1,6-10,16,17, and 20-29 with alveoloplasty;  Surgeon: [name], DDS;  Location: [facility] OR;  Service: Oral Surgery;  Laterality: N/A;   PACEMAKER PLACEMENT     TEE WITHOUT CARDIOVERSION N/A 04/09/2013   Procedure: TRANSESOPHAGEAL ECHOCARDIOGRAM (TEE);  Surgeon: Dolores Patty, MD;  Location: [facility] ENDOSCOPY;  Service: Cardiovascular;  Laterality: N/A;     Family History  Problem Relation Age of Onset   Other Mother        died elderly of unknonw cause   Other Father        died very young, unknown cause   Heart disease Brother    Hypertension Brother    Diabetes Brother    Hypertension Brother      Social History   Socioeconomic History   Marital status: Married    Spouse name: Not on file   Number of children: 1   Years of education: Not on file   Highest education level: Not on file  Occupational History    Employer: [employer]  Tobacco Use   Smoking status: Former   Smokeless tobacco: Never  Substance and Sexual Activity   Alcohol use: No   Drug use: No   Sexual activity: Yes  Other Topics Concern   Not on file  Social History Narrative   Patient is married. Patient has one grown son and 4 grandsons that live in [place].   Patient with a history of smoking but quit approximately 10 years ago. Patient denies use of alcohol or other illicit drugs. Patient has never used smokeless tobacco.    Social Determinants of Health   Financial Resource Strain: Not on file  Food Insecurity: Not on file  Transportation Needs: Not on file  Physical Activity: Not on file  Stress: Not on file  Social Connections: Not on file  Intimate Partner Violence: Not on file     BP 134/72   Pulse 65   Ht 5' 10.5" (1.791 m)   Wt 227 lb (103 kg)   SpO2 98%   BMI 32.11 kg/m   Physical Exam:  Well appearing NAD  HEENT: Unremarkable Neck:  No JVD, no thyromegally Lymphatics:  No adenopathy Back:  No CVA tenderness Lungs:  Clear HEART:  Regular rate rhythm, no murmurs, no rubs, no clicks Abd:  soft, positive bowel sounds, no organomegally, no rebound, no guarding Ext:  2 plus pulses, no edema, no cyanosis, no clubbing Skin:  No rashes no nodules Neuro:  CN II through XII intact, motor grossly intact  EKG NSR with biv pacing DEVICE  Normal device function.  See PaceArt for details.   Assess/Plan:  1. Chronic systolic heart failure - he is class 2. He will continue his current meds. 2. PAF - he is maintaining NSR. He will continue Eliquis. 3. CHB - he is asymptomatic. No change. 4. PPM - his St. Jude Biv PPM is working normally. We will recheck in several months.    Leonia Reeves.D.

## 2021-03-31 NOTE — Patient Instructions (Signed)
Medication Instructions:  Your physician recommends that you continue on your current medications as directed. Please refer to the Current Medication list given to you today.  Labwork: None ordered.  Testing/Procedures: None ordered.  Follow-Up: Your physician wants you to follow-up in: one year with [name], MD or one of the following Advanced Practice Providers on your designated Care Team:   Francis Dowse, [place] Casimiro Needle "Mardelle Matte" Lanna Poche, [place]  Remote monitoring is used to monitor your Pacemaker from home. This monitoring reduces the number of office visits required to check your device to one time per year. It allows [place] to keep an eye on the functioning of your device to ensure it is working properly. You are scheduled for a device check from home on 06/30/2021. You may send your transmission at any time that day. If you have a wireless device, the transmission will be sent automatically. After your physician reviews your transmission, you will receive a postcard with your next transmission date.  Any Other Special Instructions Will Be Listed Below (If Applicable).  If you need a refill on your cardiac medications before your next appointment, please call your pharmacy.

## 2021-06-09 DIAGNOSIS — N183 Chronic kidney disease, stage 3 unspecified: Secondary | ICD-10-CM | POA: Diagnosis not present

## 2021-06-09 DIAGNOSIS — I10 Essential (primary) hypertension: Secondary | ICD-10-CM | POA: Diagnosis not present

## 2021-06-09 DIAGNOSIS — E7800 Pure hypercholesterolemia, unspecified: Secondary | ICD-10-CM | POA: Diagnosis not present

## 2021-06-30 ENCOUNTER — Ambulatory Visit (INDEPENDENT_AMBULATORY_CARE_PROVIDER_SITE_OTHER): Payer: Medicare Other

## 2021-06-30 DIAGNOSIS — I442 Atrioventricular block, complete: Secondary | ICD-10-CM

## 2021-07-02 LAB — CUP PACEART REMOTE DEVICE CHECK
Battery Remaining Longevity: 83 mo
Battery Remaining Percentage: 87 %
Battery Voltage: 3.02 V
Brady Statistic AP VP Percent: 43 %
Brady Statistic AP VS Percent: 1 %
Brady Statistic AS VP Percent: 57 %
Brady Statistic AS VS Percent: 1 %
Brady Statistic RA Percent Paced: 43 %
Date Time Interrogation Session: 20221021060732
Implantable Lead Implant Date: 20010807
Implantable Lead Implant Date: 20010807
Implantable Lead Implant Date: 20140307
Implantable Lead Location: 753858
Implantable Lead Location: 753859
Implantable Lead Location: 753860
Implantable Pulse Generator Implant Date: 20220221
Lead Channel Impedance Value: 300 Ohm
Lead Channel Impedance Value: 330 Ohm
Lead Channel Impedance Value: 610 Ohm
Lead Channel Pacing Threshold Amplitude: 0.5 V
Lead Channel Pacing Threshold Amplitude: 0.625 V
Lead Channel Pacing Threshold Amplitude: 1.125 V
Lead Channel Pacing Threshold Pulse Width: 0.4 ms
Lead Channel Pacing Threshold Pulse Width: 0.4 ms
Lead Channel Pacing Threshold Pulse Width: 0.4 ms
Lead Channel Sensing Intrinsic Amplitude: 3.1 mV
Lead Channel Setting Pacing Amplitude: 2 V
Lead Channel Setting Pacing Amplitude: 2 V
Lead Channel Setting Pacing Amplitude: 2.125
Lead Channel Setting Pacing Pulse Width: 0.4 ms
Lead Channel Setting Pacing Pulse Width: 0.4 ms
Lead Channel Setting Sensing Sensitivity: 5 mV
Pulse Gen Model: 3222
Pulse Gen Serial Number: 9194565

## 2021-07-10 NOTE — Progress Notes (Signed)
Remote pacemaker transmission.   

## 2021-07-25 DIAGNOSIS — I10 Essential (primary) hypertension: Secondary | ICD-10-CM | POA: Diagnosis not present

## 2021-07-25 DIAGNOSIS — N183 Chronic kidney disease, stage 3 unspecified: Secondary | ICD-10-CM | POA: Diagnosis not present

## 2021-07-25 DIAGNOSIS — E7800 Pure hypercholesterolemia, unspecified: Secondary | ICD-10-CM | POA: Diagnosis not present

## 2021-09-29 ENCOUNTER — Ambulatory Visit (INDEPENDENT_AMBULATORY_CARE_PROVIDER_SITE_OTHER): Payer: Medicare Other

## 2021-09-29 DIAGNOSIS — I442 Atrioventricular block, complete: Secondary | ICD-10-CM

## 2021-09-29 LAB — CUP PACEART REMOTE DEVICE CHECK
Battery Remaining Longevity: 80 mo
Battery Remaining Percentage: 84 %
Battery Voltage: 3.02 V
Brady Statistic AP VP Percent: 44 %
Brady Statistic AP VS Percent: 1 %
Brady Statistic AS VP Percent: 56 %
Brady Statistic AS VS Percent: 1 %
Brady Statistic RA Percent Paced: 44 %
Date Time Interrogation Session: 20230120034746
Implantable Lead Implant Date: 20010807
Implantable Lead Implant Date: 20010807
Implantable Lead Implant Date: 20140307
Implantable Lead Location: 753858
Implantable Lead Location: 753859
Implantable Lead Location: 753860
Implantable Pulse Generator Implant Date: 20220221
Lead Channel Impedance Value: 300 Ohm
Lead Channel Impedance Value: 340 Ohm
Lead Channel Impedance Value: 630 Ohm
Lead Channel Pacing Threshold Amplitude: 0.5 V
Lead Channel Pacing Threshold Amplitude: 0.625 V
Lead Channel Pacing Threshold Amplitude: 0.875 V
Lead Channel Pacing Threshold Pulse Width: 0.4 ms
Lead Channel Pacing Threshold Pulse Width: 0.4 ms
Lead Channel Pacing Threshold Pulse Width: 0.4 ms
Lead Channel Sensing Intrinsic Amplitude: 3.2 mV
Lead Channel Setting Pacing Amplitude: 2 V
Lead Channel Setting Pacing Amplitude: 2 V
Lead Channel Setting Pacing Amplitude: 2 V
Lead Channel Setting Pacing Pulse Width: 0.4 ms
Lead Channel Setting Pacing Pulse Width: 0.4 ms
Lead Channel Setting Sensing Sensitivity: 5 mV
Pulse Gen Model: 3222
Pulse Gen Serial Number: 9194565

## 2021-10-11 NOTE — Progress Notes (Signed)
Remote pacemaker transmission.   

## 2021-11-07 ENCOUNTER — Other Ambulatory Visit: Payer: Self-pay | Admitting: *Deleted

## 2021-11-07 DIAGNOSIS — I5022 Chronic systolic (congestive) heart failure: Secondary | ICD-10-CM

## 2021-11-07 DIAGNOSIS — I10 Essential (primary) hypertension: Secondary | ICD-10-CM | POA: Diagnosis not present

## 2021-11-07 DIAGNOSIS — E7800 Pure hypercholesterolemia, unspecified: Secondary | ICD-10-CM | POA: Diagnosis not present

## 2021-11-07 MED ORDER — NIACIN ER (ANTIHYPERLIPIDEMIC) 500 MG PO TBCR: 500.0000 mg | EXTENDED_RELEASE_TABLET | Freq: Every day | ORAL | 3 refills | Status: DC

## 2021-11-07 MED ORDER — LOSARTAN POTASSIUM 25 MG PO TABS: 25.0000 mg | ORAL_TABLET | Freq: Every day | ORAL | 3 refills | Status: DC

## 2021-11-07 MED ORDER — AMLODIPINE BESYLATE 10 MG PO TABS: 10.0000 mg | ORAL_TABLET | Freq: Every day | ORAL | 3 refills | Status: DC

## 2021-11-07 MED ORDER — CARVEDILOL 25 MG PO TABS: 25.0000 mg | ORAL_TABLET | Freq: Two times a day (BID) | ORAL | 3 refills | Status: DC

## 2021-11-07 MED ORDER — SIMVASTATIN 80 MG PO TABS: 80.0000 mg | ORAL_TABLET | Freq: Every day | ORAL | 3 refills | Status: DC

## 2021-11-15 ENCOUNTER — Other Ambulatory Visit: Payer: Self-pay | Admitting: Internal Medicine

## 2021-11-15 DIAGNOSIS — I480 Paroxysmal atrial fibrillation: Secondary | ICD-10-CM

## 2021-11-15 DIAGNOSIS — I5022 Chronic systolic (congestive) heart failure: Secondary | ICD-10-CM

## 2021-11-15 MED ORDER — CARVEDILOL 25 MG PO TABS: 25.0000 mg | ORAL_TABLET | Freq: Two times a day (BID) | ORAL | 1 refills | Status: DC

## 2021-11-15 MED ORDER — APIXABAN 5 MG PO TABS: 5.0000 mg | ORAL_TABLET | Freq: Two times a day (BID) | ORAL | 0 refills | Status: DC

## 2021-11-15 NOTE — Telephone Encounter (Signed)
?*  STAT* If patient is at the pharmacy, call can be transferred to refill team. ? ? ?1. Which medications need to be refilled? (please list name of each medication and dose if known) Eliquis 5mg  and Carvedilol 25mg  ? ?2. Which pharmacy/location (including street and city if local pharmacy) is medication to be sent to?[facility] ? ?3. Do they need a 30 day or 90 day supply?  ?90 days ? ?Mr Bhagat came in to the office and said needs his medications to [facility] on [address] instead of [facility]. This is due to a recent [employer] change. He is currently out of his meds;Eliquis 5mg  and Carvedilol 25mg . ? ?Call him at [phone] first and then  ?[phone] second if there are any problems.  ?

## 2021-11-15 NOTE — Telephone Encounter (Signed)
Pt's medication was sent to pt's pharmacy as requested. Confirmation received.  °

## 2021-11-15 NOTE — Telephone Encounter (Signed)
Prescription refill request for Eliquis received. ?Indication: Afib  ?Last office visit:03/31/21 Ladona Ridgel) ?Scr: 1.21 (10/03/20) ?Age: 71 ?Weight: 103kg ? ?Pt's labs overdue. Called pt and made him aware. Schedule lab appt on 11/23/21. Pt stated he only had 2 tablets left of Eliquis. Refill sent to requested pharmacy and made him aware that labs need to be completed for further refills.  ? ? ? ? ?

## 2021-11-15 NOTE — Telephone Encounter (Signed)
Pt stated he is almost out of ALL his medications because last refills that were sent on 11/07/21 to [facility] were NOT received due to insurance reasons. Pt requesting ALL medications be refilled and sent to [id number] - [place], [place] - [address].  ?

## 2021-11-23 ENCOUNTER — Other Ambulatory Visit: Payer: Medicare Other

## 2021-11-23 ENCOUNTER — Telehealth: Payer: Self-pay

## 2021-11-23 ENCOUNTER — Other Ambulatory Visit: Payer: Self-pay

## 2021-11-23 DIAGNOSIS — I480 Paroxysmal atrial fibrillation: Secondary | ICD-10-CM | POA: Diagnosis not present

## 2021-11-23 LAB — CBC
Hematocrit: 46.2 % (ref 37.5–51.0)
Hemoglobin: 15.5 g/dL (ref 13.0–17.7)
MCH: 28.2 pg (ref 26.6–33.0)
MCHC: 33.5 g/dL (ref 31.5–35.7)
MCV: 84 fL (ref 79–97)
Platelets: 193 10*3/uL (ref 150–450)
RBC: 5.5 x10E6/uL (ref 4.14–5.80)
RDW: 12.8 % (ref 11.6–15.4)
WBC: 4.7 10*3/uL (ref 3.4–10.8)

## 2021-11-23 LAB — BASIC METABOLIC PANEL
BUN/Creatinine Ratio: 11 (ref 10–24)
BUN: 14 mg/dL (ref 8–27)
CO2: 25 mmol/L (ref 20–29)
Calcium: 9.5 mg/dL (ref 8.6–10.2)
Chloride: 105 mmol/L (ref 96–106)
Creatinine, Ser: 1.31 mg/dL — ABNORMAL HIGH (ref 0.76–1.27)
Glucose: 129 mg/dL — ABNORMAL HIGH (ref 70–99)
Potassium: 4.6 mmol/L (ref 3.5–5.2)
Sodium: 142 mmol/L (ref 134–144)
eGFR: 59 mL/min/{1.73_m2} — ABNORMAL LOW (ref >59)

## 2021-11-23 MED ORDER — AMLODIPINE BESYLATE 10 MG PO TABS: 10.0000 mg | ORAL_TABLET | Freq: Every day | ORAL | 1 refills | Status: DC

## 2021-11-23 MED ORDER — SIMVASTATIN 80 MG PO TABS: 80.0000 mg | ORAL_TABLET | Freq: Every day | ORAL | 1 refills | Status: DC

## 2021-11-23 MED ORDER — NIACIN ER (ANTIHYPERLIPIDEMIC) 500 MG PO TBCR: 500.0000 mg | EXTENDED_RELEASE_TABLET | Freq: Every day | ORAL | 1 refills | Status: DC

## 2021-11-23 MED ORDER — LOSARTAN POTASSIUM 25 MG PO TABS: 25.0000 mg | ORAL_TABLET | Freq: Every day | ORAL | 1 refills | Status: DC

## 2021-11-23 NOTE — Telephone Encounter (Signed)
-----   Message from Russell Martin sent at 11/23/2021 11:43 AM EDT ----- ?Regarding: Patient requesting refills ?Patient requesting refills on the following medications: ? ?NORVASC, ?COZAAR, ?NIASPAN, ?ZOCOR ? ?Patient said mobile number would be best for contacting. ? ?Thank you. ? ?

## 2021-11-23 NOTE — Telephone Encounter (Signed)
Left message for patient to call back. All his medication refill request were filled on 11/07/2021.  ?

## 2021-11-29 MED ORDER — NIACIN ER (ANTIHYPERLIPIDEMIC) 500 MG PO TBCR: 500.0000 mg | EXTENDED_RELEASE_TABLET | Freq: Every day | ORAL | 1 refills | Status: DC

## 2021-11-29 NOTE — Addendum Note (Signed)
Addended by: Louanne Belton, Alnisa Hasley A on: 11/29/2021 11:31 AM ? ? Modules accepted: Orders ? ?

## 2021-12-29 ENCOUNTER — Ambulatory Visit (INDEPENDENT_AMBULATORY_CARE_PROVIDER_SITE_OTHER): Payer: Medicare Other

## 2021-12-29 DIAGNOSIS — I442 Atrioventricular block, complete: Secondary | ICD-10-CM | POA: Diagnosis not present

## 2021-12-29 LAB — CUP PACEART REMOTE DEVICE CHECK
Battery Remaining Longevity: 77 mo
Battery Remaining Percentage: 81 %
Battery Voltage: 3.02 V
Brady Statistic AP VP Percent: 43 %
Brady Statistic AP VS Percent: 1 %
Brady Statistic AS VP Percent: 57 %
Brady Statistic AS VS Percent: 1 %
Brady Statistic RA Percent Paced: 43 %
Date Time Interrogation Session: 20230421033438
Implantable Lead Implant Date: 20010807
Implantable Lead Implant Date: 20010807
Implantable Lead Implant Date: 20140307
Implantable Lead Location: 753858
Implantable Lead Location: 753859
Implantable Lead Location: 753860
Implantable Pulse Generator Implant Date: 20220221
Lead Channel Impedance Value: 290 Ohm
Lead Channel Impedance Value: 330 Ohm
Lead Channel Impedance Value: 590 Ohm
Lead Channel Pacing Threshold Amplitude: 0.5 V
Lead Channel Pacing Threshold Amplitude: 0.625 V
Lead Channel Pacing Threshold Amplitude: 1 V
Lead Channel Pacing Threshold Pulse Width: 0.4 ms
Lead Channel Pacing Threshold Pulse Width: 0.4 ms
Lead Channel Pacing Threshold Pulse Width: 0.4 ms
Lead Channel Sensing Intrinsic Amplitude: 12 mV
Lead Channel Sensing Intrinsic Amplitude: 2.5 mV
Lead Channel Setting Pacing Amplitude: 2 V
Lead Channel Setting Pacing Amplitude: 2 V
Lead Channel Setting Pacing Amplitude: 2 V
Lead Channel Setting Pacing Pulse Width: 0.4 ms
Lead Channel Setting Pacing Pulse Width: 0.4 ms
Lead Channel Setting Sensing Sensitivity: 5 mV
Pulse Gen Model: 3222
Pulse Gen Serial Number: 9194565

## 2022-01-16 NOTE — Progress Notes (Signed)
Remote pacemaker transmission.   

## 2022-02-11 ENCOUNTER — Other Ambulatory Visit: Payer: Self-pay | Admitting: Internal Medicine

## 2022-02-11 DIAGNOSIS — I480 Paroxysmal atrial fibrillation: Secondary | ICD-10-CM

## 2022-02-12 NOTE — Telephone Encounter (Signed)
Prescription refill request for Eliquis received. Indication:Afib  Last office visit: 03/31/21 Ladona Ridgel)  Scr: 1.31 (11/23/21)  Age: 71 Weight: 103kg  Appropriate dose and refill sent to requested pharmacy.

## 2022-03-30 ENCOUNTER — Ambulatory Visit (INDEPENDENT_AMBULATORY_CARE_PROVIDER_SITE_OTHER): Payer: Medicare Other

## 2022-03-30 DIAGNOSIS — I442 Atrioventricular block, complete: Secondary | ICD-10-CM

## 2022-03-30 LAB — CUP PACEART REMOTE DEVICE CHECK
Battery Remaining Longevity: 73 mo
Battery Remaining Percentage: 78 %
Battery Voltage: 3.01 V
Brady Statistic AP VP Percent: 43 %
Brady Statistic AP VS Percent: 1 %
Brady Statistic AS VP Percent: 57 %
Brady Statistic AS VS Percent: 1 %
Brady Statistic RA Percent Paced: 43 %
Date Time Interrogation Session: 20230721041113
Implantable Lead Implant Date: 20010807
Implantable Lead Implant Date: 20010807
Implantable Lead Implant Date: 20140307
Implantable Lead Location: 753858
Implantable Lead Location: 753859
Implantable Lead Location: 753860
Implantable Pulse Generator Implant Date: 20220221
Lead Channel Impedance Value: 290 Ohm
Lead Channel Impedance Value: 310 Ohm
Lead Channel Impedance Value: 610 Ohm
Lead Channel Pacing Threshold Amplitude: 0.5 V
Lead Channel Pacing Threshold Amplitude: 0.625 V
Lead Channel Pacing Threshold Amplitude: 0.75 V
Lead Channel Pacing Threshold Pulse Width: 0.4 ms
Lead Channel Pacing Threshold Pulse Width: 0.4 ms
Lead Channel Pacing Threshold Pulse Width: 0.4 ms
Lead Channel Sensing Intrinsic Amplitude: 12 mV
Lead Channel Sensing Intrinsic Amplitude: 2.1 mV
Lead Channel Setting Pacing Amplitude: 2 V
Lead Channel Setting Pacing Amplitude: 2 V
Lead Channel Setting Pacing Amplitude: 2 V
Lead Channel Setting Pacing Pulse Width: 0.4 ms
Lead Channel Setting Pacing Pulse Width: 0.4 ms
Lead Channel Setting Sensing Sensitivity: 5 mV
Pulse Gen Model: 3222
Pulse Gen Serial Number: 9194565

## 2022-04-16 NOTE — Progress Notes (Signed)
Remote pacemaker transmission.   

## 2022-04-19 ENCOUNTER — Encounter: Payer: Self-pay | Admitting: Physician Assistant

## 2022-04-19 ENCOUNTER — Ambulatory Visit (INDEPENDENT_AMBULATORY_CARE_PROVIDER_SITE_OTHER): Payer: Medicare Other | Admitting: Physician Assistant

## 2022-04-19 VITALS — BP 132/70 | HR 60 | Ht 70.5 in | Wt 222.4 lb

## 2022-04-19 DIAGNOSIS — Z950 Presence of cardiac pacemaker: Secondary | ICD-10-CM | POA: Diagnosis not present

## 2022-04-19 DIAGNOSIS — I480 Paroxysmal atrial fibrillation: Secondary | ICD-10-CM | POA: Diagnosis not present

## 2022-04-19 DIAGNOSIS — I5022 Chronic systolic (congestive) heart failure: Secondary | ICD-10-CM

## 2022-04-19 DIAGNOSIS — I442 Atrioventricular block, complete: Secondary | ICD-10-CM

## 2022-04-19 DIAGNOSIS — Z9889 Other specified postprocedural states: Secondary | ICD-10-CM

## 2022-04-19 LAB — CUP PACEART INCLINIC DEVICE CHECK
Battery Remaining Longevity: 74 mo
Battery Voltage: 3.01 V
Brady Statistic RA Percent Paced: 43 %
Brady Statistic RV Percent Paced: 99.95 %
Date Time Interrogation Session: 20230810173047
Implantable Lead Implant Date: 20010807
Implantable Lead Implant Date: 20010807
Implantable Lead Implant Date: 20140307
Implantable Lead Location: 753858
Implantable Lead Location: 753859
Implantable Lead Location: 753860
Implantable Pulse Generator Implant Date: 20220221
Lead Channel Impedance Value: 300 Ohm
Lead Channel Impedance Value: 362.5 Ohm
Lead Channel Impedance Value: 612.5 Ohm
Lead Channel Pacing Threshold Amplitude: 0.625 V
Lead Channel Pacing Threshold Amplitude: 0.75 V
Lead Channel Pacing Threshold Amplitude: 0.75 V
Lead Channel Pacing Threshold Amplitude: 0.875 V
Lead Channel Pacing Threshold Pulse Width: 0.4 ms
Lead Channel Pacing Threshold Pulse Width: 0.4 ms
Lead Channel Pacing Threshold Pulse Width: 0.4 ms
Lead Channel Pacing Threshold Pulse Width: 0.4 ms
Lead Channel Sensing Intrinsic Amplitude: 12 mV
Lead Channel Sensing Intrinsic Amplitude: 3.6 mV
Lead Channel Setting Pacing Amplitude: 2 V
Lead Channel Setting Pacing Amplitude: 2 V
Lead Channel Setting Pacing Amplitude: 2 V
Lead Channel Setting Pacing Pulse Width: 0.4 ms
Lead Channel Setting Pacing Pulse Width: 0.4 ms
Lead Channel Setting Sensing Sensitivity: 5 mV
Pulse Gen Model: 3222
Pulse Gen Serial Number: 9194565

## 2022-04-19 NOTE — Progress Notes (Signed)
Cardiology Office Note Date:  04/19/2022  Patient ID:  Russell, Martin 1950-10-09, MRN [id number] PCP:  Renford Dills, MD  Electrophysiologist:  Dr. Ladona Ridgel    Chief Complaint:  annual EP visit  History of Present Illness: Russell Martin is a 71 y.o. male with history of VHD w/MV and TV repairs (2014), NICM,, chronic CHF (systolic) has had subsequent improvement in LVEF, Aflutter (ablated 03/2018),  AFib, CHB w/ PPM >> CRT-P, HTN, HLD.  He saw Dr. Ladona Ridgel, last sen by him 04/2018, at that time doing well, no changes were made to his medicines or device programming.  He did mention PAF and to continue his OAC.  I saw him 05/12/2019 He is doing well.  Denies any CP, palpitations or cardiac awareness, no SOB opr DOE>  Denies any exertional intolerances.  No dizziness, near syncope or syncope. He is tolerating Eliquis, no bleeding or signs of bleeding , asks about stopping it given her had the ablation last year. No changes were made. Referred to BP clinic.  A lead with stable measurements, noted noise that he would briefly track. Device dependent, no programming changes were made.  He saw the BP clinic, BP near goal, no changes were made, recommended increased exercise. Echo noted LVEF 45-50%, , no WMA, LA/RA severely dilated, Moderate-severe mitral valve stenosis Echo was reviewed with Dr. Ladona Ridgel, recommened continued a/c.  I have seen him again and Dr. Ladona Ridgel a couple times since, and had a gen change w/pocket relocation.  TTE Jan 2022, LVEF 50-55%, no WMA, described severe MS w/mean gradient 11.3,  TV repair looked OK  He last saw him 03/31/21, doing OK, some SOB, described as class II, was maintaining SR his Eliquis continued.  TODAY He is doing well Denies any CP, palpitations or cardiac awareness He has for a couple months though had a persistent cough, intermittently through the day, slightly productive, not positional No overt SOB or DOE No near syncope or syncope No bleeding  or signs of bleeding but the Eliquis is expensive and wonders of he still needs it, hopes that he could stop it  Device information SJM CRT-P, RA/RV leads 04/16/00, LV/generator 11/14/12, gen change 10/31/20 (no A lead revision) Device dependent Known A lead noise and infrequent A lead impedance alerts Dr. Ladona Ridgel discussed pre-gen change, risk of A lead revision higher then benefit and not pursued   Past Medical History:  Diagnosis Date   Cardiomyopathy (HCC) 1.10.14   reduced EF 30-35%   CHB (complete heart block) (HCC)    a. Pacer initially placed ~2002.    CHF (congestive heart failure) (HCC)    a. NICM EF 35% - no CAD by cath 09/2012. b. s/p upgrade to Bi-V pacemaker 11/2012 (did not get ICD as Dr. Ladona Ridgel expects his EF will improve with bi-V pacing).   Dyspnea    2d Echo, EF 40-45%, 5.20.14   Hyperlipidemia    Hypertension    pt denies HTN   LBBB (left bundle branch block)    Myoview, LV enlarged   Mitral valve regurgitation    Pacemaker    st jude    greg taylor   RBBB    S/P mitral valve repair 04/29/2013   8mm Sorin Memo 3D ring annuloplasty via right mini thoracotomy   S/P tricuspid valve repair 04/29/2013   28 mm Edwards mc3 ring annuloplasty via right mini thoracotomy    Past Surgical History:  Procedure Laterality Date   A-FLUTTER ABLATION N/A 04/07/2018  Procedure: A-FLUTTER ABLATION;  Surgeon: [name], MD;  Location: [facility] INVASIVE CV LAB;  Service: Cardiovascular;  Laterality: N/A;   BI-VENTRICULAR PACEMAKER UPGRADE N/A 11/14/2012   Procedure: BI-VENTRICULAR PACEMAKER UPGRADE;  Surgeon: [name], MD;  Location: [facility] CATH LAB;  Service: Cardiovascular;  Laterality: N/A;   BIV PACEMAKER GENERATOR CHANGEOUT N/A 10/31/2020   Procedure: BIV PACEMAKER GENERATOR CHANGEOUT;  Surgeon: [name], MD;  Location: [facility] INVASIVE CV LAB;  Service: Cardiovascular;  Laterality: N/A;   CARDIAC CATHETERIZATION     clean   INTRAOPERATIVE TRANSESOPHAGEAL ECHOCARDIOGRAM N/A  04/29/2013   Procedure: INTRAOPERATIVE TRANSESOPHAGEAL ECHOCARDIOGRAM;  Surgeon: [name], MD;  Location: [facility] OR;  Service: Open Heart Surgery;  Laterality: N/A;   LEFT AND RIGHT HEART CATHETERIZATION WITH CORONARY ANGIOGRAM N/A 09/19/2012   Procedure: LEFT AND RIGHT HEART CATHETERIZATION WITH CORONARY ANGIOGRAM;  Surgeon: [name], MD;  Location: [facility] CATH LAB;  Service: Cardiovascular;  Laterality: N/A;   MINIMALLY INVASIVE TRICUSPID VALVE REPAIR Right 04/29/2013   Procedure: MINIMALLY INVASIVE TRICUSPID VALVE REPAIR;  Surgeon: [name], MD;  Location: [facility] OR;  Service: Open Heart Surgery;  Laterality: Right;   MITRAL VALVE REPAIR Right 04/29/2013   Procedure: MINIMALLY INVASIVE MITRAL VALVE REPAIR (MVR);  Surgeon: [name], MD;  Location: [facility] OR;  Service: Open Heart Surgery;  Laterality: Right;   MULTIPLE EXTRACTIONS WITH ALVEOLOPLASTY N/A 03/20/2013   Procedure: Extraction of tooth #'s 1,6-10,16,17, and 20-29 with alveoloplasty;  Surgeon: [name], DDS;  Location: [facility] OR;  Service: Oral Surgery;  Laterality: N/A;   PACEMAKER PLACEMENT     TEE WITHOUT CARDIOVERSION N/A 04/09/2013   Procedure: TRANSESOPHAGEAL ECHOCARDIOGRAM (TEE);  Surgeon: Dolores Patty, MD;  Location: [facility] ENDOSCOPY;  Service: Cardiovascular;  Laterality: N/A;    Current Outpatient Medications  Medication Sig Dispense Refill   amLODipine (NORVASC) 10 MG tablet Take 1 tablet (10 mg total) by mouth daily. 90 tablet 1   apixaban (ELIQUIS) 5 MG TABS tablet TAKE 1 TABLET(5 MG) BY MOUTH TWICE DAILY 180 tablet 1   aspirin EC 81 MG tablet Take 1 tablet (81 mg total) by mouth daily.     BIOTIN PO Take by mouth daily.     BLACK CURRANT SEED OIL PO Take 25 mg by mouth daily.     carvedilol (COREG) 25 MG tablet Take 1 tablet (25 mg total) by mouth 2 (two) times daily. Please make yearly appt with Dr. Ladona Ridgel for July 2023 for future refills. Thank you 1st attempt 180 tablet 1   cholecalciferol (VITAMIN D)  1000 units tablet Take 1,000 Units by mouth daily.     losartan (COZAAR) 25 MG tablet Take 1 tablet (25 mg total) by mouth daily. 90 tablet 1   meloxicam (MOBIC) 15 MG tablet TAKE 1 TABLET(15 MG) BY MOUTH DAILY 30 tablet 0   Multiple Vitamin (MULTIVITAMIN WITH MINERALS) TABS Take 1 tablet by mouth daily. Liquid / one cap full     niacin (NIASPAN) 500 MG CR tablet Take 1 tablet (500 mg total) by mouth daily. 90 tablet 1   predniSONE (STERAPRED UNI-PAK 21 TAB) 10 MG (21) TBPK tablet See admin instructions. follow package directions     simvastatin (ZOCOR) 80 MG tablet Take 1 tablet (80 mg total) by mouth at bedtime. 90 tablet 1   vitamin B-12 (CYANOCOBALAMIN) 1000 MCG tablet Take 1,000 mcg by mouth daily.     No current facility-administered medications for this visit.    Allergies:  Patient has no known allergies.   Social History:  The patient  reports that he has quit smoking. He has never used smokeless tobacco. He reports that he does not drink alcohol and does not use drugs.   Family History:  The patient's family history includes Diabetes in his brother; Heart disease in his brother; Hypertension in his brother and brother; Other in his father and mother.  ROS:  Please see the history of present illness.  All other systems are reviewed and otherwise negative.   PHYSICAL EXAM:  VS:  There were no vitals taken for this visit. BMI: There is no height or weight on file to calculate BMI. Well nourished, well developed, in no acute distress  HEENT: normocephalic, atraumatic  Neck: no JVD, carotid bruits or masses Cardiac:  RRR; no significant murmurs are appreciated, no rubs, or gallops Lungs:  CTA b/l, no wheezing, rhonchi or rales  Abd: soft, nontender MS: no deformity or atrophy Ext:  no edema  Skin: warm and dry, no rash Neuro:  No gross deficits appreciated Psych: euthymic mood, full affect  PPM site is stable, no tethering or discomfort   EKG:  Done today and reviewed by  myself shows  AV paced, 60bpm   PPM interrogation done today and reviewed by myself:  Battery and lead measurements are stable A lead measurements particularly are stable A Lead noise noted AMS all available EGMs available are false for noise, <1% burden One logged PMT looks false  09/16/2020: TTE 1. Left ventricular ejection fraction, by estimation, is 50 to 55%. The  left ventricle has low normal function. The left ventricle has no regional  wall motion abnormalities. Left ventricular diastolic parameters are  indeterminate. Elevated left  ventricular end-diastolic pressure.   2. Right ventricular systolic function is normal. The right ventricular  size is normal.   3. Left atrial size was mildly dilated.   4. Mitral valve mean gradient slightly lower than 05/2019. Mitral vavel  area 0.69 cm^2 by continuity equation. Valve area 1.54 cm^2 by planimetry.  The mitral valve has been repaired/replaced. No evidence of mitral valve  regurgitation. Severe mitral  stenosis. The mean mitral valve gradient is 11.3 mmHg.   5. Normally functioning tricuspid valve annuloplasty. Tricuspid valve  mean gradient 2 mmHg. The tricuspid valve is has been repaired/replaced.  The tricuspid valve is status post repair with an annuloplasty ring.   6. The aortic valve is calcified. There is moderate calcification of the  aortic valve. There is moderate thickening of the aortic valve. Aortic  valve regurgitation is mild to moderate. No aortic stenosis is present.  Aortic regurgitation PHT measures 491   msec.   7. Aortic dilatation noted. There is mild dilatation of the ascending  aorta, measuring 39 mm.   8. The inferior vena cava is normal in size with greater than 50%  respiratory variability, suggesting right atrial pressure of 3 mmHg.     05/15/2019: TTE IMPRESSIONS   1. The left ventricle has mildly reduced systolic function, with an  ejection fraction of 45-50%. The cavity size was normal. Left  ventricular  diastolic function could not be evaluated due to mitral valve  replacement/repair. Indeterminate filling  pressures There is abnormal septal motion consistent with post-operative  status and abnormal septal motion consistent with left bundle branch  block. Left ventrical global hypokinesis without regional wall motion  abnormalities.   2. The right ventricle has normal systolic function. The cavity was  mildly enlarged. There is  no increase in right ventricular wall thickness.   3. Left atrial size was severely dilated.   4. Right atrial size was severely dilated.   5. A annuloplasty valve is present in the mitral position.   6. Moderate-severe mitral valve stenosis. Mean gradient 13 mm Hg at 60  bpm. Calculated valve area 1 cm sq using the PHT method.   7. Status post tricuspid valve repair. valve is present in the tricuspid  position.   8. The tricuspid valve is myxomatous.   9. Mildly increased tricuspid valve gradients following annuloplasty  (mean gradient 2.5 mm Hg at heart rate 66 bpm).  10. The aortic valve is tricuspid. Mild thickening of the aortic valve.  Sclerosis without any evidence of stenosis of the aortic valve. Aortic  valve regurgitation is trivial by color flow Doppler.  11. The aorta is normal unless otherwise noted.  12. When compared to the prior study: since 2018, there is a slight  increase in mitral valve gradients and prolongation of the pressure half  time. There also appears to be a slight decrease in LVEF, which is  primarily reduced due to septal-lateral  dyssynchrony.    04/07/18: EPS/ablation, Dr. Ladona Ridgel CONCLUSIONS:  1. Isthmus-dependent right atrial flutter upon presentation.  2. Successful radiofrequency ablation of atrial flutter along the cavotricuspid isthmus with complete bidirectional isthmus block achieved using CARTO.  3. No inducible arrhythmias following ablation.  4. No early apparent complications   12/24/16: TTE Study  Conclusions - Left ventricle: The cavity size was normal. Systolic function was   normal. The estimated ejection fraction was in the range of 55%   to 60%. Images were inadequate for LV wall motion assessment. - Aortic valve: Trileaflet; mildly thickened, mildly calcified   leaflets. There was mild regurgitation. - Mitral valve: S/P mitral valve repair. There appears to be at   least moderate mitral stenosis with a mean MV gradient of   10-22mmHg and MVA calculated at 1.39cm2. There is no mitral   regurgitation. Valve area by pressure half-time: 1.39 cm^2. - Left atrium: The atrium was mildly dilated. - Right ventricle: The cavity size was moderately dilated. Wall   thickness was normal. Systolic function was mildly to moderately   reduced. - Impressions: Normal LVF, s/p MV repair with moderate mitral   stenosis (Mean MV gradient has increased from to 10-15mmHg   since last study), mild LAE, moderate RVE.   Impressions: - Normal LVF, s/p MV repair with moderate mitral stenosis (Mean MV   gradient has increased from to 10-108mmHg since last study),   mild LAE, moderate RVE.     Recent Labs: 11/23/2021: BUN 14; Creatinine, Ser 1.31; Hemoglobin 15.5; Platelets 193; Potassium 4.6; Sodium 142  No results found for requested labs within last 365 days.   CrCl cannot be calculated (Patient's most recent lab result is older than the maximum 21 days allowed.).   Wt Readings from Last 3 Encounters:  03/31/21 227 lb (103 kg)  10/31/20 230 lb (104.3 kg)  10/03/20 239 lb 12.8 oz (108.8 kg)     Other studies reviewed: Additional studies/records reviewed today include: summarized above  ASSESSMENT AND PLAN:  1. CRT-P     Known A lead noise     Stable impedance    2. PAF (?), h/o AFlutter ablation July 2019     CHA2DS2Vasc is 2, on Eliquis, appropriately dosed     No true Af episodes noted  Recommend he check price of Xarelto as  a possible alternative Historically Dr.  Ladona Ridgel has felt best to continue a/c.   3. HTN     Looks OK   4. VHD     H/o TV and MV repairs     MV stenosis described as severe by his last echo, mean gradient 11.3     No symptoms ov VHD, no significant murmur on exam     Update his echo       5. NICM 6. Chronic CHF     On BB/ARB     Last echo last year 50-55%     No symptom, exam findings to suggest fluid OL     C/o chronic cough     CXR   Disposition: pending echo, otherwise 17mo, sooner if needed    Current medicines are reviewed at length with the patient today.  The patient did not have any concerns regarding medicines.  Norma Fredrickson, PA-C 04/19/2022 12:46 PM     [facility] [address] [place] [address] [phone] (office)  [phone] (fax)

## 2022-04-19 NOTE — Patient Instructions (Addendum)
Medication Instructions:  Your physician recommends that you continue on your current medications as directed. Please refer to the Current Medication list given to you today.  *If you need a refill on your cardiac medications before your next appointment, please call your pharmacy*  Please look into the cost of Xarelto, as an alternative to Eliquis.  PLEASE let us know if changes need to be made.   Lab Work: None ordered.  If you have labs (blood work) drawn today and your tests are completely normal, you will receive your results only by: MyChart Message (if you have MyChart) OR A paper copy in the mail If you have any lab test that is abnormal or we need to change your treatment, we will call you to review the results.  Testing/Procedures: Please schedule an Echocardiogram   Please schedule a Chest Xray - PA/LAT    Follow-Up:  IN 6 MONTHS with Francis Dowse, PA-C  We recommend signing up for the patient portal called "MyChart".  Sign up information is provided on this After Visit Summary.  MyChart is used to connect with patients for Virtual Visits (Telemedicine).  Patients are able to view lab/test results, encounter notes, upcoming appointments, etc.  Non-urgent messages can be sent to your provider as well.   To learn more about what you can do with MyChart, go to [email or web address].    Remote monitoring is used to monitor your Pacemaker/ ICD from home. This monitoring reduces the number of office visits required to check your device to one time per year. It allows [place] to keep an eye on the functioning of your device to ensure it is working properly. You are scheduled for a device check from home on 06/29/2022. You may send your transmission at any time that day. If you have a wireless device, the transmission will be sent automatically. After your physician reviews your transmission, you will receive a postcard with your next transmission date.  Important Information About  [place]      Echocardiogram An echocardiogram is a test that uses sound waves (ultrasound) to produce images of the heart. Images from an echocardiogram can provide important information about: Heart size and shape. The size and thickness and movement of your heart's walls. Heart muscle function and strength. Heart valve function or if you have stenosis. Stenosis is when the heart valves are too narrow. If blood is flowing backward through the heart valves (regurgitation). A tumor or infectious growth around the heart valves. Areas of heart muscle that are not working well because of poor blood flow or injury from a heart attack. Aneurysm detection. An aneurysm is a weak or damaged part of an artery wall. The wall bulges out from the normal force of blood pumping through the body. Tell a health care provider about: Any allergies you have. All medicines you are taking, including vitamins, herbs, eye drops, creams, and over-the-counter medicines. Any blood disorders you have. Any surgeries you have had. Any medical conditions you have. Whether you are pregnant or may be pregnant. What are the risks? Generally, this is a safe test. However, problems may occur, including an allergic reaction to dye (contrast) that may be used during the test. What happens before the test? No specific preparation is needed. You may eat and drink normally. What happens during the test?  You will take off your clothes from the waist up and put on a hospital gown. Electrodes or electrocardiogram (ECG)patches may be placed on your chest. The electrodes or patches  are then connected to a device that monitors your heart rate and rhythm. You will lie down on a table for an ultrasound exam. A gel will be applied to your chest to help sound waves pass through your skin. A handheld device, called a transducer, will be pressed against your chest and moved over your heart. The transducer produces sound waves that  travel to your heart and bounce back (or "echo" back) to the transducer. These sound waves will be captured in real-time and changed into images of your heart that can be viewed on a video monitor. The images will be recorded on a computer and reviewed by your health care provider. You may be asked to change positions or hold your breath for a short time. This makes it easier to get different views or better views of your heart. In some cases, you may receive contrast through an IV in one of your veins. This can improve the quality of the pictures from your heart. The procedure may vary among health care providers and hospitals. What can I expect after the test? You may return to your normal, everyday life, including diet, activities, and medicines, unless your health care provider tells you not to do that. Follow these instructions at home: It is up to you to get the results of your test. Ask your health care provider, or the department that is doing the test, when your results will be ready. Keep all follow-up visits. This is important. Summary An echocardiogram is a test that uses sound waves (ultrasound) to produce images of the heart. Images from an echocardiogram can provide important information about the size and shape of your heart, heart muscle function, heart valve function, and other possible heart problems. You do not need to do anything to prepare before this test. You may eat and drink normally. After the echocardiogram is completed, you may return to your normal, everyday life, unless your health care provider tells you not to do that. This information is not intended to replace advice given to you by your health care provider. Make sure you discuss any questions you have with your health care provider. Document Revised: 05/10/2021 Document Reviewed: 04/19/2020 Elsevier Patient Education  2023 [employer].

## 2022-04-24 ENCOUNTER — Ambulatory Visit
Admission: RE | Admit: 2022-04-24 | Discharge: 2022-04-24 | Disposition: A | Discharge status: Home / Self Care | Payer: Medicare Other | Source: Ambulatory Visit | Attending: Physician Assistant | Admitting: Physician Assistant

## 2022-04-24 DIAGNOSIS — I442 Atrioventricular block, complete: Secondary | ICD-10-CM

## 2022-04-24 DIAGNOSIS — I5022 Chronic systolic (congestive) heart failure: Secondary | ICD-10-CM

## 2022-04-24 DIAGNOSIS — Z950 Presence of cardiac pacemaker: Secondary | ICD-10-CM

## 2022-04-24 DIAGNOSIS — R059 Cough, unspecified: Secondary | ICD-10-CM | POA: Diagnosis not present

## 2022-04-24 DIAGNOSIS — I480 Paroxysmal atrial fibrillation: Secondary | ICD-10-CM

## 2022-04-27 ENCOUNTER — Telehealth: Payer: Self-pay | Admitting: *Deleted

## 2022-04-27 NOTE — Telephone Encounter (Signed)
Lvm for patient with results on cell phone no answer on home phone.

## 2022-04-27 NOTE — Telephone Encounter (Signed)
-----   Message from [facility] [place], [place] sent at 04/25/2022  2:55 PM EDT ----- CXR is good, no infection or congestion, would d/w his cough with his PMD

## 2022-05-03 DIAGNOSIS — I10 Essential (primary) hypertension: Secondary | ICD-10-CM | POA: Diagnosis not present

## 2022-05-03 DIAGNOSIS — E7800 Pure hypercholesterolemia, unspecified: Secondary | ICD-10-CM | POA: Diagnosis not present

## 2022-05-03 DIAGNOSIS — Z0000 Encounter for general adult medical examination without abnormal findings: Secondary | ICD-10-CM | POA: Diagnosis not present

## 2022-05-03 DIAGNOSIS — R972 Elevated prostate specific antigen [PSA]: Secondary | ICD-10-CM | POA: Diagnosis not present

## 2022-05-03 DIAGNOSIS — Z7901 Long term (current) use of anticoagulants: Secondary | ICD-10-CM | POA: Diagnosis not present

## 2022-05-03 DIAGNOSIS — Z5181 Encounter for therapeutic drug level monitoring: Secondary | ICD-10-CM | POA: Diagnosis not present

## 2022-05-03 DIAGNOSIS — Z950 Presence of cardiac pacemaker: Secondary | ICD-10-CM | POA: Diagnosis not present

## 2022-05-08 ENCOUNTER — Ambulatory Visit (HOSPITAL_COMMUNITY): Payer: Medicare Other | Attending: Cardiovascular Disease

## 2022-05-08 DIAGNOSIS — I442 Atrioventricular block, complete: Secondary | ICD-10-CM | POA: Diagnosis not present

## 2022-05-08 DIAGNOSIS — I480 Paroxysmal atrial fibrillation: Secondary | ICD-10-CM | POA: Diagnosis not present

## 2022-05-08 DIAGNOSIS — I5022 Chronic systolic (congestive) heart failure: Secondary | ICD-10-CM | POA: Insufficient documentation

## 2022-05-08 DIAGNOSIS — Z950 Presence of cardiac pacemaker: Secondary | ICD-10-CM | POA: Insufficient documentation

## 2022-05-08 LAB — ECHOCARDIOGRAM COMPLETE
Area-P 1/2: 2.49 cm2
MV VTI: 0.88 cm2
P 1/2 time: 345 msec
S' Lateral: 3.2 cm

## 2022-05-12 ENCOUNTER — Other Ambulatory Visit: Payer: Self-pay | Admitting: Internal Medicine

## 2022-05-12 DIAGNOSIS — I5022 Chronic systolic (congestive) heart failure: Secondary | ICD-10-CM

## 2022-05-14 ENCOUNTER — Other Ambulatory Visit: Payer: Self-pay | Admitting: Internal Medicine

## 2022-05-14 DIAGNOSIS — I10 Essential (primary) hypertension: Secondary | ICD-10-CM

## 2022-05-17 ENCOUNTER — Other Ambulatory Visit: Payer: Self-pay

## 2022-05-17 DIAGNOSIS — I10 Essential (primary) hypertension: Secondary | ICD-10-CM

## 2022-05-17 MED ORDER — LOSARTAN POTASSIUM 25 MG PO TABS: 25.0000 mg | ORAL_TABLET | Freq: Every day | ORAL | 3 refills | Status: DC

## 2022-05-26 ENCOUNTER — Other Ambulatory Visit: Payer: Self-pay | Admitting: Internal Medicine

## 2022-06-29 ENCOUNTER — Ambulatory Visit (INDEPENDENT_AMBULATORY_CARE_PROVIDER_SITE_OTHER): Payer: Medicare Other

## 2022-06-29 DIAGNOSIS — I442 Atrioventricular block, complete: Secondary | ICD-10-CM | POA: Diagnosis not present

## 2022-06-29 LAB — CUP PACEART REMOTE DEVICE CHECK
Battery Remaining Longevity: 70 mo
Battery Remaining Percentage: 75 %
Battery Voltage: 3.01 V
Brady Statistic AP VP Percent: 46 %
Brady Statistic AP VS Percent: 1 %
Brady Statistic AS VP Percent: 54 %
Brady Statistic AS VS Percent: 1 %
Brady Statistic RA Percent Paced: 45 %
Date Time Interrogation Session: 20231020114154
Implantable Lead Implant Date: 20010807
Implantable Lead Implant Date: 20010807
Implantable Lead Implant Date: 20140307
Implantable Lead Location: 753858
Implantable Lead Location: 753859
Implantable Lead Location: 753860
Implantable Pulse Generator Implant Date: 20220221
Lead Channel Impedance Value: 290 Ohm
Lead Channel Impedance Value: 300 Ohm
Lead Channel Impedance Value: 610 Ohm
Lead Channel Pacing Threshold Amplitude: 0.75 V
Lead Channel Pacing Threshold Amplitude: 0.75 V
Lead Channel Pacing Threshold Amplitude: 1 V
Lead Channel Pacing Threshold Pulse Width: 0.4 ms
Lead Channel Pacing Threshold Pulse Width: 0.4 ms
Lead Channel Pacing Threshold Pulse Width: 0.4 ms
Lead Channel Sensing Intrinsic Amplitude: 12 mV
Lead Channel Sensing Intrinsic Amplitude: 2.5 mV
Lead Channel Setting Pacing Amplitude: 2 V
Lead Channel Setting Pacing Amplitude: 2 V
Lead Channel Setting Pacing Amplitude: 2 V
Lead Channel Setting Pacing Pulse Width: 0.4 ms
Lead Channel Setting Pacing Pulse Width: 0.4 ms
Lead Channel Setting Sensing Sensitivity: 5 mV
Pulse Gen Model: 3222
Pulse Gen Serial Number: 9194565

## 2022-07-05 NOTE — Progress Notes (Signed)
Remote pacemaker transmission.   

## 2022-07-23 DIAGNOSIS — N400 Enlarged prostate without lower urinary tract symptoms: Secondary | ICD-10-CM | POA: Diagnosis not present

## 2022-07-23 DIAGNOSIS — R972 Elevated prostate specific antigen [PSA]: Secondary | ICD-10-CM | POA: Diagnosis not present

## 2022-08-12 ENCOUNTER — Other Ambulatory Visit: Payer: Self-pay | Admitting: Internal Medicine

## 2022-08-12 DIAGNOSIS — I480 Paroxysmal atrial fibrillation: Secondary | ICD-10-CM

## 2022-08-13 NOTE — Telephone Encounter (Signed)
Prescription refill request for Eliquis received. Indication:afib Last office visit:8/23 Scr:1.3 Age: 71 Weight:100.9  kg  Prescription refilled

## 2022-09-28 ENCOUNTER — Ambulatory Visit: Payer: Medicare Other | Attending: Internal Medicine

## 2022-09-28 DIAGNOSIS — I429 Cardiomyopathy, unspecified: Secondary | ICD-10-CM

## 2022-10-01 LAB — CUP PACEART REMOTE DEVICE CHECK
Battery Remaining Longevity: 67 mo
Battery Remaining Percentage: 72 %
Battery Voltage: 3.01 V
Brady Statistic AP VP Percent: 45 %
Brady Statistic AP VS Percent: 1 %
Brady Statistic AS VP Percent: 55 %
Brady Statistic AS VS Percent: 1 %
Brady Statistic RA Percent Paced: 45 %
Date Time Interrogation Session: 20240119020031
Implantable Lead Connection Status: 753985
Implantable Lead Connection Status: 753985
Implantable Lead Connection Status: 753985
Implantable Lead Implant Date: 20010807
Implantable Lead Implant Date: 20010807
Implantable Lead Implant Date: 20140307
Implantable Lead Location: 753858
Implantable Lead Location: 753859
Implantable Lead Location: 753860
Implantable Pulse Generator Implant Date: 20220221
Lead Channel Impedance Value: 280 Ohm
Lead Channel Impedance Value: 330 Ohm
Lead Channel Impedance Value: 590 Ohm
Lead Channel Pacing Threshold Amplitude: 0.75 V
Lead Channel Pacing Threshold Amplitude: 0.75 V
Lead Channel Pacing Threshold Amplitude: 0.875 V
Lead Channel Pacing Threshold Pulse Width: 0.4 ms
Lead Channel Pacing Threshold Pulse Width: 0.4 ms
Lead Channel Pacing Threshold Pulse Width: 0.4 ms
Lead Channel Sensing Intrinsic Amplitude: 12 mV
Lead Channel Sensing Intrinsic Amplitude: 3.5 mV
Lead Channel Setting Pacing Amplitude: 2 V
Lead Channel Setting Pacing Amplitude: 2 V
Lead Channel Setting Pacing Amplitude: 2 V
Lead Channel Setting Pacing Pulse Width: 0.4 ms
Lead Channel Setting Pacing Pulse Width: 0.4 ms
Lead Channel Setting Sensing Sensitivity: 5 mV
Pulse Gen Model: 3222
Pulse Gen Serial Number: 9194565

## 2022-10-02 DIAGNOSIS — M25561 Pain in right knee: Secondary | ICD-10-CM | POA: Diagnosis not present

## 2022-10-12 ENCOUNTER — Other Ambulatory Visit: Payer: Self-pay | Admitting: Gastroenterology

## 2022-10-12 DIAGNOSIS — Z1211 Encounter for screening for malignant neoplasm of colon: Secondary | ICD-10-CM

## 2022-10-12 DIAGNOSIS — Z7901 Long term (current) use of anticoagulants: Secondary | ICD-10-CM | POA: Diagnosis not present

## 2022-10-15 NOTE — Progress Notes (Signed)
Remote pacemaker transmission.   

## 2022-10-22 ENCOUNTER — Other Ambulatory Visit: Payer: Self-pay | Admitting: Gastroenterology

## 2022-10-22 DIAGNOSIS — Z1211 Encounter for screening for malignant neoplasm of colon: Secondary | ICD-10-CM

## 2022-10-25 ENCOUNTER — Telehealth: Payer: Self-pay | Admitting: *Deleted

## 2022-10-25 DIAGNOSIS — R972 Elevated prostate specific antigen [PSA]: Secondary | ICD-10-CM | POA: Diagnosis not present

## 2022-10-25 DIAGNOSIS — N400 Enlarged prostate without lower urinary tract symptoms: Secondary | ICD-10-CM | POA: Diagnosis not present

## 2022-10-25 NOTE — Telephone Encounter (Signed)
   Pre-operative Risk Assessment    Patient Name: Russell Martin  DOB: 02/06/1951 MRN: [id number]      Request for Surgical Clearance    Procedure:   PROSTATE Bx   Date of Surgery:  Clearance TBD                                 Surgeon:  DR. Junious Silk Surgeon's Group or Practice Name:  [facility] Phone number:  [phone] Fax number:  [phone]   Type of Clearance Requested:   - Medical  - Pharmacy:  Hold Apixaban (Eliquis) x 3 DAYS PRIOR   Type of Anesthesia:  Not Indicated   Additional requests/questions:    Jiles Prows   10/25/2022, 5:46 PM

## 2022-10-26 ENCOUNTER — Telehealth: Payer: Self-pay | Admitting: *Deleted

## 2022-10-26 NOTE — Telephone Encounter (Signed)
Patient with diagnosis of afib on Eliquis for anticoagulation.    Procedure: Prostate Bx Date of procedure: TBD   CHA2DS2-VASc Score = 3   This indicates a 3.2% annual risk of stroke. The patient's score is based upon: CHF History: 1 HTN History: 1 Diabetes History: 0 Stroke History: 0 Vascular Disease History: 0 Age Score: 1 Gender Score: 0      CrCl 63 ml/min  Per office protocol, patient can hold Eliquis for 3 days prior to procedure.    **This guidance is not considered finalized until pre-operative APP has relayed final recommendations.**

## 2022-10-26 NOTE — Telephone Encounter (Signed)
  Patient Consent for Virtual Visit         Russell Martin has provided verbal consent on 10/26/2022 for a virtual visit (video or telephone).   CONSENT FOR VIRTUAL VISIT FOR:  Russell Martin  By participating in this virtual visit I agree to the following:  I hereby voluntarily request, consent and authorize [facility] and its employed or contracted physicians, physician assistants, nurse practitioners or other licensed health care professionals (the Practitioner), to provide me with telemedicine health care services (the "Services") as deemed necessary by the treating Practitioner. I acknowledge and consent to receive the Services by the Practitioner via telemedicine. I understand that the telemedicine visit will involve communicating with the Practitioner through live audiovisual communication technology and the disclosure of certain medical information by electronic transmission. I acknowledge that I have been given the opportunity to request an in-person assessment or other available alternative prior to the telemedicine visit and am voluntarily participating in the telemedicine visit.  I understand that I have the right to withhold or withdraw my consent to the use of telemedicine in the course of my care at any time, without affecting my right to future care or treatment, and that the Practitioner or I may terminate the telemedicine visit at any time. I understand that I have the right to inspect all information obtained and/or recorded in the course of the telemedicine visit and may receive copies of available information for a reasonable fee.  I understand that some of the potential risks of receiving the Services via telemedicine include:  Delay or interruption in medical evaluation due to technological equipment failure or disruption; Information transmitted may not be sufficient (e.g. poor resolution of images) to allow for appropriate medical decision making by the Practitioner;  and/or  In rare instances, security protocols could fail, causing a breach of personal health information.  Furthermore, I acknowledge that it is my responsibility to provide information about my medical history, conditions and care that is complete and accurate to the best of my ability. I acknowledge that Practitioner's advice, recommendations, and/or decision may be based on factors not within their control, such as incomplete or inaccurate data provided by me or distortions of diagnostic images or specimens that may result from electronic transmissions. I understand that the practice of medicine is not an exact science and that Practitioner makes no warranties or guarantees regarding treatment outcomes. I acknowledge that a copy of this consent can be made available to me via my patient portal ([place]), or I can request a printed copy by calling the office of [facility].    I understand that my insurance will be billed for this visit.   I have read or had this consent read to me. I understand the contents of this consent, which adequately explains the benefits and risks of the Services being provided via telemedicine.  I have been provided ample opportunity to ask questions regarding this consent and the Services and have had my questions answered to my satisfaction. I give my informed consent for the services to be provided through the use of telemedicine in my medical care

## 2022-10-26 NOTE — Telephone Encounter (Signed)
   Name: Russell Martin  DOB: 1950/09/28  MRN: [id number]  Primary Cardiologist: None   Preoperative team, please contact this patient and set up a phone call appointment for further preoperative risk assessment. Please obtain consent and complete medication review. Thank you for your help.  I confirm that guidance regarding antiplatelet and oral anticoagulation therapy has been completed and, if necessary, noted below.  Per office protocol, patient can hold Eliquis for 3 days prior to procedure.    Ledora Bottcher, PA 10/26/2022, 3:07 PM [place]

## 2022-10-30 DIAGNOSIS — Z1212 Encounter for screening for malignant neoplasm of rectum: Secondary | ICD-10-CM | POA: Diagnosis not present

## 2022-10-30 DIAGNOSIS — Z1211 Encounter for screening for malignant neoplasm of colon: Secondary | ICD-10-CM | POA: Diagnosis not present

## 2022-10-30 NOTE — Progress Notes (Unsigned)
Virtual Visit via Telephone Note   Because of Russell Martin's co-morbid illnesses, he is at least at moderate risk for complications without adequate follow up.  This format is felt to be most appropriate for this patient at this time.  The patient did not have access to video technology/had technical difficulties with video requiring transitioning to audio format only (telephone).  All issues noted in this document were discussed and addressed.  No physical exam could be performed with this format.  Please refer to the patient's chart for his consent to telehealth for [facility].  Evaluation Performed:  Preoperative cardiovascular risk assessment _____________   Date:  10/30/2022   Patient ID:  Russell Martin, DOB 12-21-1950, MRN [id number] Patient Location:  Home Provider location:   Office  Primary Care Provider:  Seward Carol, MD Primary Cardiologist:  None  Chief Complaint / Patient Profile   72 y.o. y/o male with a h/o PAF on chronic anticoagulation, atrial flutter s/p ablation 03/2018, NICM, chronic HFrEF with subsequent improvement in LVEF, CHB with PPM implant >>CRT-P, hypertension, HLD, mitral valve and tricuspid valve repairs 2014 who is pending prostate biopsy and presents today for telephonic preoperative cardiovascular risk assessment.  He is known to be device dependent.  History of Present Illness    Russell Martin is a 72 y.o. male who presents via audio/video conferencing for a telehealth visit today.  Pt was last seen in cardiology clinic on 04/19/22 by [name], PA.  At that time ROMELIO KLEIN was doing well with stable findings on echo 05/08/22.  The patient is now pending procedure as outlined above. Since his last visit, he *** denies chest pain, shortness of breath, lower extremity edema, fatigue, palpitations, melena, hematuria, hemoptysis, diaphoresis, weakness, presyncope, syncope, orthopnea, and PND.   Past Medical History    Past Medical History:   Diagnosis Date   Cardiomyopathy ([place]) 1.10.14   reduced EF 30-35%   CHB (complete heart block) ([place])    a. Pacer initially placed ~2002.    CHF (congestive heart failure) ([place])    a. NICM EF 35% - no CAD by cath 09/2012. b. s/p upgrade to Bi-V pacemaker 11/2012 (did not get ICD as Dr. Lovena Le expects his EF will improve with bi-V pacing).   Dyspnea    2d Echo, EF 40-45%, 5.20.14   Hyperlipidemia    Hypertension    pt denies HTN   LBBB (left bundle branch block)    Myoview, LV enlarged   Mitral valve regurgitation    Pacemaker    st jude    greg taylor   RBBB    S/P mitral valve repair 04/29/2013   69m Sorin Memo 3D ring annuloplasty via right mini thoracotomy   S/P tricuspid valve repair 04/29/2013   28 mm Edwards mc3 ring annuloplasty via right mini thoracotomy   Past Surgical History:  Procedure Laterality Date   A-FLUTTER ABLATION N/A 04/07/2018   Procedure: A-FLUTTER ABLATION;  Surgeon: TEvans Lance MD;  [facility];  Service: Cardiovascular;  Laterality: N/A;   BI-VENTRICULAR PACEMAKER UPGRADE N/A 11/14/2012   Procedure: BI-VENTRICULAR PACEMAKER UPGRADE;  Surgeon: GEvans Lance MD;  [facility];  Service: Cardiovascular;  Laterality: N/A;   BIV PACEMAKER GENERATOR CHANGEOUT N/A 10/31/2020   Procedure: BIV PACEMAKER GENERATOR CHANGEOUT;  Surgeon: TEvans Lance MD;  [facility];  Service: Cardiovascular;  Laterality: N/A;   CARDIAC CATHETERIZATION     clean  INTRAOPERATIVE TRANSESOPHAGEAL ECHOCARDIOGRAM N/A 04/29/2013   Procedure: INTRAOPERATIVE TRANSESOPHAGEAL ECHOCARDIOGRAM;  Surgeon: Rexene Alberts, MD;  Location: [facility];  Service: Open Heart Surgery;  Laterality: N/A;   LEFT AND RIGHT HEART CATHETERIZATION WITH CORONARY ANGIOGRAM N/A 09/19/2012   Procedure: LEFT AND RIGHT HEART CATHETERIZATION WITH CORONARY ANGIOGRAM;  Surgeon: Troy Sine, MD;  Location[facility] C[facility] LAB;  Service: Cardiovascular;  Laterality: N/A;   MINIMALLY  INVASIVE TRICUSPID VALVE REPAIR Right 04/29/2013   Procedure: MINIMALLY INVASIVE TRICUSPID VALVE REPAIR;  Surgeon: Rexene Alberts, MD;  Location: [facility];  Service: Open Heart Surgery;  Laterality: Right;   MITRAL VALVE REPAIR Right 04/29/2013   Procedure: MINIMALLY INVASIVE MITRAL VALVE REPAIR (MVR);  Surgeon: Rexene Alberts, MD;  Location: [facility];  Service: Open Heart Surgery;  Laterality: Right;   MULTIPLE EXTRACTIONS WITH ALVEOLOPLASTY N/A 03/20/2013   Procedure: Extraction of tooth #'s 1,6-10,16,17, and 20-29 with alveoloplasty;  Surgeon: Lenn Cal, DDS;  Location: [facility];  Service: Oral Surgery;  Laterality: N/A;   PACEMAKER PLACEMENT     TEE WITHOUT CARDIOVERSION N/A 04/09/2013   Procedure: TRANSESOPHAGEAL ECHOCARDIOGRAM (TEE);  Surgeon: Jolaine Artist, MD;  Location: [facility] ENDOSCOPY;  Service: Cardiovascular;  Laterality: N/A;    Allergies  No Known Allergies  Home Medications    Prior to Admission medications   Medication Sig Start Date End Date Taking? Authorizing Provider  amLODipine (NORVASC) 10 MG tablet TAKE 1 TABLET(10 MG) BY MOUTH DAILY 05/16/22   Evans Lance, MD  aspirin EC 81 MG tablet Take 1 tablet (81 mg total) by mouth daily. 12/10/16   Shirley Friar, PA-C  BIOTIN PO Take by mouth daily.    [provider]  BLACK CURRANT SEED OIL PO Take 25 mg by mouth daily.    [provider]  carvedilol (COREG) 25 MG tablet TAKE 1 TABLET BY MOUTH TWICE DAILY 05/15/22   Evans Lance, MD  cholecalciferol (VITAMIN D) 1000 units tablet Take 1,000 Units by mouth daily.    [provider]  ELIQUIS 5 MG TABS tablet TAKE 1 TABLET(5 MG) BY MOUTH TWICE DAILY 08/13/22   Evans Lance, MD  finasteride (PROSCAR) 5 MG tablet Take 5 mg by mouth daily. 10/19/22   [provider]  losartan (COZAAR) 25 MG tablet Take 1 tablet (25 mg total) by mouth daily. 05/17/22   Evans Lance, MD  meloxicam (MOBIC) 15 MG tablet TAKE 1 TABLET(15 MG) BY MOUTH DAILY  02/15/21   Hyatt, Max T, DPM  Multiple Vitamin (MULTIVITAMIN WITH MINERALS) TABS Take 1 tablet by mouth daily. Liquid / one cap full    [provider]  niacin (VITAMIN B3) 500 MG ER tablet TAKE 1 TABLET(500 MG) BY MOUTH DAILY 05/28/22   Evans Lance, MD  simvastatin (ZOCOR) 80 MG tablet TAKE 1 TABLET(80 MG) BY MOUTH AT BEDTIME 05/16/22   Evans Lance, MD  vitamin B-12 (CYANOCOBALAMIN) 1000 MCG tablet Take 1,000 mcg by mouth daily.    [provider]    Physical Exam    Vital Signs:  LINZIE BRUNSVOLD does not have vital signs available for review today.***  Given telephonic nature of communication, physical exam is limited. AAOx3. NAD. Normal affect.  Speech and respirations are unlabored.  Accessory Clinical Findings    None  Assessment & Plan    1.  Preoperative Cardiovascular Risk Assessment:  The patient was advised that if he develops new symptoms prior to surgery to contact  our office to arrange for a follow-up visit, and he verbalized understanding.  Per office protocol, patient can hold Eliquis for 3 days prior to procedure.     A copy of this note will be routed to requesting surgeon.  Time:   Today, I have spent *** minutes with the patient with telehealth technology discussing medical history, symptoms, and management plan.     Emmaline Life, NP  10/30/2022, 3:22 PM

## 2022-10-31 ENCOUNTER — Ambulatory Visit: Payer: Medicare Other | Attending: Internal Medicine | Admitting: Nurse Practitioner

## 2022-10-31 ENCOUNTER — Encounter: Payer: Self-pay | Admitting: Nurse Practitioner

## 2022-10-31 DIAGNOSIS — Z01810 Encounter for preprocedural cardiovascular examination: Secondary | ICD-10-CM | POA: Diagnosis not present

## 2022-11-11 ENCOUNTER — Other Ambulatory Visit: Payer: Self-pay | Admitting: Internal Medicine

## 2022-11-11 DIAGNOSIS — I5022 Chronic systolic (congestive) heart failure: Secondary | ICD-10-CM

## 2022-11-13 ENCOUNTER — Other Ambulatory Visit: Payer: Self-pay | Admitting: Internal Medicine

## 2022-11-13 DIAGNOSIS — I5022 Chronic systolic (congestive) heart failure: Secondary | ICD-10-CM

## 2022-11-15 ENCOUNTER — Telehealth: Payer: Self-pay

## 2022-11-15 NOTE — Telephone Encounter (Signed)
   Pre-operative Risk Assessment    Patient Name: Russell Martin  DOB: 08-31-1951 MRN: [id number]     Request for Surgical Clearance    Procedure:  Colonoscopy   Date of Surgery:  Clearance April 2024                                 Surgeon:  Dr. Alessandra Bevels  Surgeon's Group or Practice Name:  [facility]  Phone number:  [phone] Fax number:  [phone]   Type of Clearance Requested:   - Pharmacy:  Hold Apixaban (Eliquis) 2 days   Type of Anesthesia:  Propofol    Additional requests/questions:    [name]   11/15/2022, 11:28 AM

## 2022-11-16 NOTE — Telephone Encounter (Signed)
Left message to call back to schedule a tele pre op appt.  ?

## 2022-11-16 NOTE — Telephone Encounter (Signed)
Patient with diagnosis of afib on Eliquis for anticoagulation.    Procedure: colonoscopy Date of procedure: TBD  CHA2DS2-VASc Score = 3  This indicates a 3.2% annual risk of stroke. The patient's score is based upon: CHF History: 1 HTN History: 1 Diabetes History: 0 Stroke History: 0 Vascular Disease History: 0 Age Score: 1 Gender Score: 0   CrCl 76m/min using adjusted body weight Platelet count 193K  Per office protocol, patient can hold Eliquis for 2 days prior to procedure as requested.    **This guidance is not considered finalized until pre-operative APP has relayed final recommendations.**

## 2022-11-16 NOTE — Telephone Encounter (Signed)
Primary Cardiologist:None   Preoperative team, please contact this patient and set up a phone call appointment for further preoperative risk assessment. Please obtain consent and complete medication review. Thank you for your help.   I confirm that guidance regarding antiplatelet and oral anticoagulation therapy has been completed and, if necessary, noted below.   Emmaline Life, NP-C  11/16/2022, 1:16 PM [address] Office [phone] Fax [phone]

## 2022-11-20 ENCOUNTER — Inpatient Hospital Stay: Admission: RE | Admit: 2022-11-20 | Payer: Medicare Other | Source: Ambulatory Visit

## 2022-11-20 NOTE — Telephone Encounter (Signed)
2nd attempt to reach pt regarding surgical clearance and the need for a tele visit.  Left a message for pt to call back and ask for the preop team. 

## 2022-11-21 ENCOUNTER — Telehealth: Payer: Self-pay | Admitting: *Deleted

## 2022-11-21 NOTE — Telephone Encounter (Signed)
I s/w the pt and he has been scheduled for tele pre op appt 11/30/22 @ 3 pm.

## 2022-11-21 NOTE — Telephone Encounter (Signed)
I s/w the pt and he has been scheduled for tele pre op appt 11/30/22 @ 3 pm.      Patient Consent for Virtual Visit        Russell Martin has provided verbal consent on 11/21/2022 for a virtual visit (video or telephone).   CONSENT FOR VIRTUAL VISIT FOR:  Russell Martin  By participating in this virtual visit I agree to the following:  I hereby voluntarily request, consent and authorize [facility] and its employed or contracted physicians, physician assistants, nurse practitioners or other licensed health care professionals (the Practitioner), to provide me with telemedicine health care services (the "Services") as deemed necessary by the treating Practitioner. I acknowledge and consent to receive the Services by the Practitioner via telemedicine. I understand that the telemedicine visit will involve communicating with the Practitioner through live audiovisual communication technology and the disclosure of certain medical information by electronic transmission. I acknowledge that I have been given the opportunity to request an in-person assessment or other available alternative prior to the telemedicine visit and am voluntarily participating in the telemedicine visit.  I understand that I have the right to withhold or withdraw my consent to the use of telemedicine in the course of my care at any time, without affecting my right to future care or treatment, and that the Practitioner or I may terminate the telemedicine visit at any time. I understand that I have the right to inspect all information obtained and/or recorded in the course of the telemedicine visit and may receive copies of available information for a reasonable fee.  I understand that some of the potential risks of receiving the Services via telemedicine include:  Delay or interruption in medical evaluation due to technological equipment failure or disruption; Information transmitted may not be sufficient (e.g. poor resolution  of images) to allow for appropriate medical decision making by the Practitioner; and/or  In rare instances, security protocols could fail, causing a breach of personal health information.  Furthermore, I acknowledge that it is my responsibility to provide information about my medical history, conditions and care that is complete and accurate to the best of my ability. I acknowledge that Practitioner's advice, recommendations, and/or decision may be based on factors not within their control, such as incomplete or inaccurate data provided by me or distortions of diagnostic images or specimens that may result from electronic transmissions. I understand that the practice of medicine is not an exact science and that Practitioner makes no warranties or guarantees regarding treatment outcomes. I acknowledge that a copy of this consent can be made available to me via my patient portal ([place]), or I can request a printed copy by calling the office of [facility].    I understand that my insurance will be billed for this visit.   I have read or had this consent read to me. I understand the contents of this consent, which adequately explains the benefits and risks of the Services being provided via telemedicine.  I have been provided ample opportunity to ask questions regarding this consent and the Services and have had my questions answered to my satisfaction. I give my informed consent for the services to be provided through the use of telemedicine in my medical care

## 2022-11-30 ENCOUNTER — Ambulatory Visit: Payer: Medicare Other | Attending: Cardiovascular Disease

## 2022-11-30 DIAGNOSIS — Z01810 Encounter for preprocedural cardiovascular examination: Secondary | ICD-10-CM

## 2022-11-30 NOTE — Progress Notes (Signed)
Virtual Visit via Telephone Note   Because of Russell Martin's co-morbid illnesses, he is at least at moderate risk for complications without adequate follow up.  This format is felt to be most appropriate for this patient at this time.  The patient did not have access to video technology/had technical difficulties with video requiring transitioning to audio format only (telephone).  All issues noted in this document were discussed and addressed.  No physical exam could be performed with this format.  Please refer to the patient's chart for his consent to telehealth for [facility].  Evaluation Performed:  Preoperative cardiovascular risk assessment _____________   Date:  11/30/2022   Patient ID:  Russell Martin, DOB September 27, 1950, MRN [id number] Patient Location:  Home Provider location:   Office  Primary Care Provider:  Seward Carol, MD Primary Cardiologist:  None  Chief Complaint / Patient Profile   72 y.o. y/o male with a h/o PAF on chronic anticoagulation, atrial flutter status post ablation 03/2018, NICM, chronic HFrEF with subsequent improvement of LVEF, CHB with PPM implant (CRT-P), hypertension, hyperlipidemia, mitral valve and tricuspid valve repairs 2014 who is pending colonoscopy and presents today for telephonic preoperative cardiovascular risk assessment.  History of Present Illness    Russell Martin is a 72 y.o. male who presents via audio/video conferencing for a telehealth visit today.  Pt was last seen in cardiology clinic on 04/19/2022 by [name], PA.  At that time DENZELLE FARD was doing well.  Last month he was getting clearance for a prostate biopsy and spoke with Stephan Minister, NP.  At that visit he denied chest pain, shortness of breath, lower extremity edema, fatigue, palpitations, melena, hematuria, hemoptysis, diaphoresis, weakness, presyncope, syncope, orthopnea, and PND.  The patient is now pending procedure as outlined above. Since his last visit, he  states that he is not having any chest pain or palpitations.  No syncope or shortness of breath.  He does yard work regularly and mows, weed eats, etc.  He sounds very active.  He mentioned taking Levaquin the morning of surgery which she thinks might be for either his permanent pacemaker or due to his previous valve repair surgeries.  No pain, no SOB, no palps, no syncope. Levaquin prescribed and he thinks he is taking it before his colonoscopy for his PPM or his previous valve repair surgeries.  Patient has exceeded 4 METS of physical activity, scored 6.55 METS on the DASI.  Per office protocol, patient can hold Eliquis for 2 days prior to procedure as requested. Continue baby aspirin throughout procedure.  Past Medical History    Past Medical History:  Diagnosis Date   Cardiomyopathy ([place]) 1.10.14   reduced EF 30-35%   CHB (complete heart block) ([place])    a. Pacer initially placed ~2002.    CHF (congestive heart failure) ([place])    a. NICM EF 35% - no CAD by cath 09/2012. b. s/p upgrade to Bi-V pacemaker 11/2012 (did not get ICD as Dr. Lovena Le expects his EF will improve with bi-V pacing).   Dyspnea    2d Echo, EF 40-45%, 5.20.14   Hyperlipidemia    Hypertension    pt denies HTN   LBBB (left bundle branch block)    Myoview, LV enlarged   Mitral valve regurgitation    Pacemaker    st jude    greg taylor   RBBB    S/P mitral valve repair 04/29/2013   46mm Sorin Memo 3D ring annuloplasty  via right mini thoracotomy   S/P tricuspid valve repair 04/29/2013   28 mm Edwards mc3 ring annuloplasty via right mini thoracotomy   Past Surgical History:  Procedure Laterality Date   A-FLUTTER ABLATION N/A 04/07/2018   Procedure: A-FLUTTER ABLATION;  Surgeon: Evans Lance, MD;  Location: [place] CV LAB;  Service: Cardiovascular;  Laterality: N/A;   BI-VENTRICULAR PACEMAKER UPGRADE N/A 11/14/2012   Procedure: BI-VENTRICULAR PACEMAKER UPGRADE;  Surgeon: Evans Lance, MD;  Location: [facility] CATH LAB;   Service: Cardiovascular;  Laterality: N/A;   BIV PACEMAKER GENERATOR CHANGEOUT N/A 10/31/2020   Procedure: BIV PACEMAKER GENERATOR CHANGEOUT;  Surgeon: Evans Lance, MD;  Location: [place] CV [facility];  Service: Cardiovascular;  Laterality: N/A;   CARDIAC CATHETERIZATION     clean   INTRAOPERATIVE TRANSESOPHAGEAL ECHOCARDIOGRAM N/A 04/29/2013   Procedure: INTRAOPERATIVE TRANSESOPHAGEAL ECHOCARDIOGRAM;  Surgeon: Rexene Alberts, MD;  Location: [place];  Service: Open Heart Surgery;  Laterality: N/A;   LEFT AND RIGHT HEART CATHETERIZATION WITH CORONARY ANGIOGRAM N/A 09/19/2012   Procedure: LEFT AND RIGHT HEART CATHETERIZATION WITH CORONARY ANGIOGRAM;  Surgeon: Troy Sine, MD;  Location: [facility] CATH LAB;  Service: Cardiovascular;  Laterality: N/A;   MINIMALLY INVASIVE TRICUSPID VALVE REPAIR Right 04/29/2013   Procedure: MINIMALLY INVASIVE TRICUSPID VALVE REPAIR;  Surgeon: Rexene Alberts, MD;  Location: [place];  Service: Open Heart Surgery;  Laterality: Right;   MITRAL VALVE REPAIR Right 04/29/2013   Procedure: MINIMALLY INVASIVE MITRAL VALVE REPAIR (MVR);  Surgeon: Rexene Alberts, MD;  Location: [place];  Service: Open Heart Surgery;  Laterality: Right;   MULTIPLE EXTRACTIONS WITH ALVEOLOPLASTY N/A 03/20/2013   Procedure: Extraction of tooth #'s 1,6-10,16,17, and 20-29 with alveoloplasty;  Surgeon: Lenn Cal, DDS;  Location: [place];  Service: Oral Surgery;  Laterality: N/A;   PACEMAKER PLACEMENT     TEE WITHOUT CARDIOVERSION N/A 04/09/2013   Procedure: TRANSESOPHAGEAL ECHOCARDIOGRAM (TEE);  Surgeon: Jolaine Artist, MD;  Location: [facility] ENDOSCOPY;  Service: Cardiovascular;  Laterality: N/A;    Allergies  No Known Allergies  Home Medications    Prior to Admission medications   Medication Sig Start Date End Date Taking? Authorizing Provider  amLODipine (NORVASC) 10 MG tablet TAKE 1 TABLET(10 MG) BY MOUTH DAILY 05/16/22   Evans Lance, MD  aspirin EC 81 MG tablet Take 1 tablet (81 mg total) by  mouth daily. 12/10/16   Shirley Friar, PA-C  BIOTIN PO Take by mouth daily.    [provider]  BLACK CURRANT SEED OIL PO Take 25 mg by mouth daily.    [provider]  carvedilol (COREG) 25 MG tablet TAKE 1 TABLET BY MOUTH TWICE DAILY 11/13/22   Bensimhon, Shaune Pascal, MD  cholecalciferol (VITAMIN D) 1000 units tablet Take 1,000 Units by mouth daily.    [provider]  ELIQUIS 5 MG TABS tablet TAKE 1 TABLET(5 MG) BY MOUTH TWICE DAILY 08/13/22   Evans Lance, MD  finasteride (PROSCAR) 5 MG tablet Take 5 mg by mouth daily. 10/19/22   [provider]  losartan (COZAAR) 25 MG tablet Take 1 tablet (25 mg total) by mouth daily. 05/17/22   Evans Lance, MD  meloxicam (MOBIC) 15 MG tablet TAKE 1 TABLET(15 MG) BY MOUTH DAILY 02/15/21   Hyatt, Max T, DPM  Multiple Vitamin (MULTIVITAMIN WITH MINERALS) TABS Take 1 tablet by mouth daily. Liquid / one cap full    [provider]  niacin (VITAMIN B3) 500 MG ER  tablet TAKE 1 TABLET(500 MG) BY MOUTH DAILY 05/28/22   Evans Lance, MD  simvastatin (ZOCOR) 80 MG tablet TAKE 1 TABLET(80 MG) BY MOUTH AT BEDTIME 05/16/22   Evans Lance, MD  vitamin B-12 (CYANOCOBALAMIN) 1000 MCG tablet Take 1,000 mcg by mouth daily.    [provider]    Physical Exam    Vital Signs:  LOCKLIN STANICK does not have vital signs available for review today.  Given telephonic nature of communication, physical exam is limited. AAOx3. NAD. Normal affect.  Speech and respirations are unlabored.  Accessory Clinical Findings    None  Assessment & Plan    1.  Preoperative Cardiovascular Risk Assessment:  Mr. Mcdorman perioperative risk of a major cardiac event is 0.9% according to the Revised Cardiac Risk Index (RCRI).  Therefore, he is at low risk for perioperative complications.   His functional capacity is good at 6.55 METs according to the [facility] Activity Status Index (DASI). Recommendations: According to ACC/AHA guidelines,  no further cardiovascular testing needed.  The patient may proceed to surgery at acceptable risk.   Antiplatelet and/or Anticoagulation Recommendations: The patient should remain on Aspirin without interruption.   Eliquis (Apixaban) can be held for 2 days prior to surgery.  Please resume post op when felt to be safe.     The patient was advised that if he develops new symptoms prior to surgery to contact our office to arrange for a follow-up visit, and he verbalized understanding.   A copy of this note will be routed to requesting surgeon.  Time:   Today, I have spent 6 minutes with the patient with telehealth technology discussing medical history, symptoms, and management plan.     Elgie Collard, PA-C  11/30/2022, 11:58 AM

## 2022-12-12 DIAGNOSIS — R972 Elevated prostate specific antigen [PSA]: Secondary | ICD-10-CM | POA: Diagnosis not present

## 2022-12-20 DIAGNOSIS — R972 Elevated prostate specific antigen [PSA]: Secondary | ICD-10-CM | POA: Diagnosis not present

## 2022-12-20 DIAGNOSIS — R351 Nocturia: Secondary | ICD-10-CM | POA: Diagnosis not present

## 2022-12-20 DIAGNOSIS — N401 Enlarged prostate with lower urinary tract symptoms: Secondary | ICD-10-CM | POA: Diagnosis not present

## 2022-12-28 ENCOUNTER — Ambulatory Visit (INDEPENDENT_AMBULATORY_CARE_PROVIDER_SITE_OTHER): Payer: Medicare Other

## 2022-12-28 DIAGNOSIS — I429 Cardiomyopathy, unspecified: Secondary | ICD-10-CM

## 2022-12-28 LAB — CUP PACEART REMOTE DEVICE CHECK
Battery Remaining Longevity: 65 mo
Battery Remaining Percentage: 68 %
Battery Voltage: 3.01 V
Brady Statistic AP VP Percent: 43 %
Brady Statistic AP VS Percent: 1 %
Brady Statistic AS VP Percent: 57 %
Brady Statistic AS VS Percent: 1 %
Brady Statistic RA Percent Paced: 43 %
Date Time Interrogation Session: 20240419022420
Implantable Lead Connection Status: 753985
Implantable Lead Connection Status: 753985
Implantable Lead Connection Status: 753985
Implantable Lead Implant Date: 20010807
Implantable Lead Implant Date: 20010807
Implantable Lead Implant Date: 20140307
Implantable Lead Location: 753858
Implantable Lead Location: 753859
Implantable Lead Location: 753860
Implantable Pulse Generator Implant Date: 20220221
Lead Channel Impedance Value: 290 Ohm
Lead Channel Impedance Value: 330 Ohm
Lead Channel Impedance Value: 600 Ohm
Lead Channel Pacing Threshold Amplitude: 0.625 V
Lead Channel Pacing Threshold Amplitude: 0.75 V
Lead Channel Pacing Threshold Amplitude: 0.875 V
Lead Channel Pacing Threshold Pulse Width: 0.4 ms
Lead Channel Pacing Threshold Pulse Width: 0.4 ms
Lead Channel Pacing Threshold Pulse Width: 0.4 ms
Lead Channel Sensing Intrinsic Amplitude: 12 mV
Lead Channel Sensing Intrinsic Amplitude: 2.9 mV
Lead Channel Setting Pacing Amplitude: 2 V
Lead Channel Setting Pacing Amplitude: 2 V
Lead Channel Setting Pacing Amplitude: 2 V
Lead Channel Setting Pacing Pulse Width: 0.4 ms
Lead Channel Setting Pacing Pulse Width: 0.4 ms
Lead Channel Setting Sensing Sensitivity: 5 mV
Pulse Gen Model: 3222
Pulse Gen Serial Number: 9194565

## 2022-12-30 NOTE — Progress Notes (Unsigned)
Cardiology Office Note Date:  01/02/2023  Patient ID:  Russell Martin, Russell Martin 23-Feb-1951, MRN [id number] PCP:  Renford Dills, MD  Electrophysiologist:  Dr. Ladona Ridgel    Chief Complaint:   6 mo  History of Present Illness: Russell Martin is a 72 y.o. male with history of VHD w/MV and TV repairs (2014), NICM,, chronic CHF (systolic) has had subsequent improvement in LVEF, Aflutter (ablated 03/2018),  AFib, CHB w/ PPM >> CRT-P, HTN, HLD.  I saw him 05/12/2019 He is doing well.  Denies any CP, palpitations or cardiac awareness, no SOB opr DOE>  Denies any exertional intolerances.  No dizziness, near syncope or syncope. He is tolerating Eliquis, no bleeding or signs of bleeding , asks about stopping it given her had the ablation last year. No changes were made. Referred to BP clinic.  A lead with stable measurements, noted noise that he would briefly track. Device dependent, no programming changes were made.  He saw the BP clinic, BP near goal, no changes were made, recommended increased exercise. Echo noted LVEF 45-50%, , no WMA, LA/RA severely dilated, Moderate-severe mitral valve stenosis Echo was reviewed with Dr. Ladona Ridgel, recommened continued a/c.  I have seen him again and Dr. Ladona Ridgel a couple times since, and had a gen change w/pocket relocation.  TTE Jan 2022, LVEF 50-55%, no WMA, described severe MS w/mean gradient 11.3,  TV repair looked OK  He last saw him 03/31/21, doing OK, some SOB, described as class II, was maintaining SR his Eliquis continued.  I saw him 04/19/22 He is doing well Denies any CP, palpitations or cardiac awareness He has for a couple months though had a persistent cough, intermittently through the day, slightly productive, not positional No overt SOB or DOE No near syncope or syncope No bleeding or signs of bleeding but the Eliquis is expensive and wonders of he still needs it, hopes that he could stop it Advised to continue a/c, and to check on cost of Xarelto as  an alternative Planned to update his echo  11/30/22: pre-op pool televist for colonoscopy, doing well, felt a reasonable cardiac risk/patient for procedure.   TODAY He is doing really well Back into his season, cuts grass/lawns to stay busy, active, engaged, really enjoys it. Has good exertional capacity Noticed that the cough he used to have seems to be gone. No CP, palpitations or cardiac awareness No SOB, DOE No near syncope or syncope. No bleeding or signs of bleeding  Pending a virtual colonoscopy apparently with abnormal cologaurd    Device information SJM CRT-P, RA/RV leads are from 04/16/00, LV/generator 11/14/12, gen change 10/31/20 (no A lead revision)  Device dependent  Known A lead noise and infrequent A lead impedance alerts Dr. Ladona Ridgel discussed pre-gen change, risk of A lead revision higher then benefit and not pursued   Past Medical History:  Diagnosis Date   Cardiomyopathy 1.10.14   reduced EF 30-35%   CHB (complete heart block)    a. Pacer initially placed ~2002.    CHF (congestive heart failure)    a. NICM EF 35% - no CAD by cath 09/2012. b. s/p upgrade to Bi-V pacemaker 11/2012 (did not get ICD as Dr. Ladona Ridgel expects his EF will improve with bi-V pacing).   Dyspnea    2d Echo, EF 40-45%, 5.20.14   Hyperlipidemia    Hypertension    pt denies HTN   LBBB (left bundle branch block)    Myoview, LV enlarged   Mitral valve regurgitation  Pacemaker    st jude    greg taylor   RBBB    S/P mitral valve repair 04/29/2013   28mm Sorin Memo 3D ring annuloplasty via right mini thoracotomy   S/P tricuspid valve repair 04/29/2013   28 mm Edwards mc3 ring annuloplasty via right mini thoracotomy    Past Surgical History:  Procedure Laterality Date   A-FLUTTER ABLATION N/A 04/07/2018   Procedure: A-FLUTTER ABLATION;  Surgeon: [name], MD;  Location: [facility] INVASIVE CV LAB;  Service: Cardiovascular;  Laterality: N/A;   BI-VENTRICULAR PACEMAKER UPGRADE N/A 11/14/2012    Procedure: BI-VENTRICULAR PACEMAKER UPGRADE;  Surgeon: [name], MD;  Location: [facility] CATH LAB;  Service: Cardiovascular;  Laterality: N/A;   BIV PACEMAKER GENERATOR CHANGEOUT N/A 10/31/2020   Procedure: BIV PACEMAKER GENERATOR CHANGEOUT;  Surgeon: [name], MD;  Location: [facility] INVASIVE CV LAB;  Service: Cardiovascular;  Laterality: N/A;   CARDIAC CATHETERIZATION     clean   INTRAOPERATIVE TRANSESOPHAGEAL ECHOCARDIOGRAM N/A 04/29/2013   Procedure: INTRAOPERATIVE TRANSESOPHAGEAL ECHOCARDIOGRAM;  Surgeon: [name], MD;  Location: [facility] OR;  Service: Open Heart Surgery;  Laterality: N/A;   LEFT AND RIGHT HEART CATHETERIZATION WITH CORONARY ANGIOGRAM N/A 09/19/2012   Procedure: LEFT AND RIGHT HEART CATHETERIZATION WITH CORONARY ANGIOGRAM;  Surgeon: [name], MD;  Location: [facility] CATH LAB;  Service: Cardiovascular;  Laterality: N/A;   MINIMALLY INVASIVE TRICUSPID VALVE REPAIR Right 04/29/2013   Procedure: MINIMALLY INVASIVE TRICUSPID VALVE REPAIR;  Surgeon: [name], MD;  Location: [facility] OR;  Service: Open Heart Surgery;  Laterality: Right;   MITRAL VALVE REPAIR Right 04/29/2013   Procedure: MINIMALLY INVASIVE MITRAL VALVE REPAIR (MVR);  Surgeon: [name], MD;  Location: [facility] OR;  Service: Open Heart Surgery;  Laterality: Right;   MULTIPLE EXTRACTIONS WITH ALVEOLOPLASTY N/A 03/20/2013   Procedure: Extraction of tooth #'s 1,6-10,16,17, and 20-29 with alveoloplasty;  Surgeon: [name], DDS;  Location: [facility] OR;  Service: Oral Surgery;  Laterality: N/A;   PACEMAKER PLACEMENT     TEE WITHOUT CARDIOVERSION N/A 04/09/2013   Procedure: TRANSESOPHAGEAL ECHOCARDIOGRAM (TEE);  Surgeon: Dolores Patty, MD;  Location: [facility] ENDOSCOPY;  Service: Cardiovascular;  Laterality: N/A;    Current Outpatient Medications  Medication Sig Dispense Refill   amLODipine (NORVASC) 10 MG tablet TAKE 1 TABLET(10 MG) BY MOUTH DAILY 90 tablet 3   aspirin EC 81 MG tablet Take 1 tablet (81 mg total) by mouth  daily.     BIOTIN PO Take by mouth daily.     BLACK CURRANT SEED OIL PO Take 25 mg by mouth daily.     carvedilol (COREG) 25 MG tablet TAKE 1 TABLET BY MOUTH TWICE DAILY 180 tablet 0   cholecalciferol (VITAMIN D) 1000 units tablet Take 1,000 Units by mouth daily.     ELIQUIS 5 MG TABS tablet TAKE 1 TABLET(5 MG) BY MOUTH TWICE DAILY 180 tablet 1   finasteride (PROSCAR) 5 MG tablet Take 5 mg by mouth daily.     losartan (COZAAR) 25 MG tablet Take 1 tablet (25 mg total) by mouth daily. 90 tablet 3   meloxicam (MOBIC) 15 MG tablet TAKE 1 TABLET(15 MG) BY MOUTH DAILY 30 tablet 0   Multiple Vitamin (MULTIVITAMIN WITH MINERALS) TABS Take 1 tablet by mouth daily. Liquid / one cap full     niacin (VITAMIN B3) 500 MG ER tablet TAKE 1 TABLET(500 MG) BY MOUTH DAILY 90 tablet 3   simvastatin (ZOCOR) 80 MG tablet TAKE 1  TABLET(80 MG) BY MOUTH AT BEDTIME 90 tablet 3   vitamin B-12 (CYANOCOBALAMIN) 1000 MCG tablet Take 1,000 mcg by mouth daily.     No current facility-administered medications for this visit.    Allergies:   Patient has no known allergies.   Social History:  The patient  reports that he has quit smoking. He has never used smokeless tobacco. He reports that he does not drink alcohol and does not use drugs.   Family History:  The patient's family history includes Diabetes in his brother; Heart disease in his brother; Hypertension in his brother and brother; Other in his father and mother.  ROS:  Please see the history of present illness.  All other systems are reviewed and otherwise negative.   PHYSICAL EXAM:  VS:  BP 120/80   Pulse 64   Ht 5' 10.5" (1.791 m)   Wt 232 lb 9.6 oz (105.5 kg)   SpO2 97%   BMI 32.90 kg/m  BMI: Body mass index is 32.9 kg/m. Well nourished, well developed, in no acute distress  HEENT: normocephalic, atraumatic  Neck: no JVD, carotid bruits or masses Cardiac:  RRR; no significant murmurs are appreciated, no rubs, or gallops Lungs:  CTA b/l, no wheezing,  rhonchi or rales  Abd: soft, nontender MS: no deformity or atrophy Ext:  no edema  Skin: warm and dry, no rash Neuro:  No gross deficits appreciated Psych: euthymic mood, full affect  PPM site is stable, no tethering or discomfort   EKG:  not done today   PPM interrogation done today and reviewed by myself:  Battery and lead measurements are good AMS are all brief A lead noise No true Afib noted No VT   05/08/22: TTE 1. Left ventricular ejection fraction, by estimation, is 55%. The left  ventricle has normal function. The left ventricle has no regional wall  motion abnormalities. Left ventricular diastolic parameters are  indeterminate.   2. Pacing wire in RA/RV. Right ventricular systolic function is mildly  reduced. The right ventricular size is mildly enlarged. There is normal  pulmonary artery systolic pressure.   3. Left atrial size was severely dilated.   4. Right atrial size was moderately dilated.   5. Post repair with annuloplasty ring At HR 64 bpm mean gradient 16 peak  32 mmHg MVA 1.6 cm2 consistent with moderate stenosis . The mitral valve  has been repaired/replaced. No evidence of mitral valve regurgitation. No  evidence of mitral stenosis.   6. Post ring repair with mild residual TR. The tricuspid valve is has  been repaired/replaced.   7. The aortic valve is tricuspid. There is moderate calcification of the  aortic valve. There is moderate thickening of the aortic valve. Aortic  valve regurgitation is mild. Aortic valve sclerosis is present, with no  evidence of aortic valve stenosis.   8. The inferior vena cava is normal in size with greater than 50%  respiratory variability, suggesting right atrial pressure of 3 mmHg.   09/16/2020: TTE 1. Left ventricular ejection fraction, by estimation, is 50 to 55%. The  left ventricle has low normal function. The left ventricle has no regional  wall motion abnormalities. Left ventricular diastolic parameters are   indeterminate. Elevated left  ventricular end-diastolic pressure.   2. Right ventricular systolic function is normal. The right ventricular  size is normal.   3. Left atrial size was mildly dilated.   4. Mitral valve mean gradient slightly lower than 05/2019. Mitral vavel  area 0.69 cm^2  by continuity equation. Valve area 1.54 cm^2 by planimetry.  The mitral valve has been repaired/replaced. No evidence of mitral valve  regurgitation. Severe mitral  stenosis. The mean mitral valve gradient is 11.3 mmHg.   5. Normally functioning tricuspid valve annuloplasty. Tricuspid valve  mean gradient 2 mmHg. The tricuspid valve is has been repaired/replaced.  The tricuspid valve is status post repair with an annuloplasty ring.   6. The aortic valve is calcified. There is moderate calcification of the  aortic valve. There is moderate thickening of the aortic valve. Aortic  valve regurgitation is mild to moderate. No aortic stenosis is present.  Aortic regurgitation PHT measures 491   msec.   7. Aortic dilatation noted. There is mild dilatation of the ascending  aorta, measuring 39 mm.   8. The inferior vena cava is normal in size with greater than 50%  respiratory variability, suggesting right atrial pressure of 3 mmHg.     05/15/2019: TTE IMPRESSIONS   1. The left ventricle has mildly reduced systolic function, with an  ejection fraction of 45-50%. The cavity size was normal. Left ventricular  diastolic function could not be evaluated due to mitral valve  replacement/repair. Indeterminate filling  pressures There is abnormal septal motion consistent with post-operative  status and abnormal septal motion consistent with left bundle branch  block. Left ventrical global hypokinesis without regional wall motion  abnormalities.   2. The right ventricle has normal systolic function. The cavity was  mildly enlarged. There is no increase in right ventricular wall thickness.   3. Left atrial size was  severely dilated.   4. Right atrial size was severely dilated.   5. A annuloplasty valve is present in the mitral position.   6. Moderate-severe mitral valve stenosis. Mean gradient 13 mm Hg at 60  bpm. Calculated valve area 1 cm sq using the PHT method.   7. Status post tricuspid valve repair. valve is present in the tricuspid  position.   8. The tricuspid valve is myxomatous.   9. Mildly increased tricuspid valve gradients following annuloplasty  (mean gradient 2.5 mm Hg at heart rate 66 bpm).  10. The aortic valve is tricuspid. Mild thickening of the aortic valve.  Sclerosis without any evidence of stenosis of the aortic valve. Aortic  valve regurgitation is trivial by color flow Doppler.  11. The aorta is normal unless otherwise noted.  12. When compared to the prior study: since 2018, there is a slight  increase in mitral valve gradients and prolongation of the pressure half  time. There also appears to be a slight decrease in LVEF, which is  primarily reduced due to septal-lateral  dyssynchrony.    04/07/18: EPS/ablation, Dr. Ladona Ridgel CONCLUSIONS:  1. Isthmus-dependent right atrial flutter upon presentation.  2. Successful radiofrequency ablation of atrial flutter along the cavotricuspid isthmus with complete bidirectional isthmus block achieved using CARTO.  3. No inducible arrhythmias following ablation.  4. No early apparent complications   12/24/16: TTE Study Conclusions - Left ventricle: The cavity size was normal. Systolic function was   normal. The estimated ejection fraction was in the range of 55%   to 60%. Images were inadequate for LV wall motion assessment. - Aortic valve: Trileaflet; mildly thickened, mildly calcified   leaflets. There was mild regurgitation. - Mitral valve: S/P mitral valve repair. There appears to be at   least moderate mitral stenosis with a mean MV gradient of   10-76mmHg and MVA calculated at 1.39cm2. There is no mitral  regurgitation.  Valve area by pressure half-time: 1.39 cm^2. - Left atrium: The atrium was mildly dilated. - Right ventricle: The cavity size was moderately dilated. Wall   thickness was normal. Systolic function was mildly to moderately   reduced. - Impressions: Normal LVF, s/p MV repair with moderate mitral   stenosis (Mean MV gradient has increased from to 10-70mmHg   since last study), mild LAE, moderate RVE.   Impressions: - Normal LVF, s/p MV repair with moderate mitral stenosis (Mean MV   gradient has increased from to 10-58mmHg since last study),   mild LAE, moderate RVE.     Recent Labs: No results found for requested labs within last 365 days.  No results found for requested labs within last 365 days.   CrCl cannot be calculated (Patient's most recent lab result is older than the maximum 21 days allowed.).   Wt Readings from Last 3 Encounters:  01/02/23 232 lb 9.6 oz (105.5 kg)  04/19/22 222 lb 6.4 oz (100.9 kg)  03/31/21 227 lb (103 kg)     Other studies reviewed: Additional studies/records reviewed today include: summarized above  ASSESSMENT AND PLAN:  1. CRT-P     Known A lead noise     Stable measurements     No programming changes made   2. PAF (?), h/o AFlutter ablation July 2019     CHA2DS2Vasc is 2, on Eliquis,  appropriately dosed     No true Af episodes noted     Labs today   3. HTN     looks OK   4. VHD     H/o TV and MV repairs     Mod MS, no MR, mild TR     No symptoms of VHD, no significant murmur on exam         5. NICM 6. Chronic CHF     On BB/ARB     Last echo 55%, RV systolic function mildly reduced, size mildly enlarged     No symptom, exam findings to suggest fluid OL     C/o chronic cough      CorVue looks good        Disposition: we will continue Q6 mo visits given no other cardiology following    Current medicines are reviewed at length with the patient today.  The patient did not have any concerns regarding  medicines.  Norma Fredrickson, PA-C 01/02/2023 9:09 AM     [facility] [address] [place] [address] [phone] (office)  [phone] (fax)

## 2023-01-02 ENCOUNTER — Ambulatory Visit: Payer: Medicare Other | Attending: Physician Assistant | Admitting: Physician Assistant

## 2023-01-02 ENCOUNTER — Encounter: Payer: Self-pay | Admitting: Physician Assistant

## 2023-01-02 VITALS — BP 120/80 | HR 64 | Ht 70.5 in | Wt 232.6 lb

## 2023-01-02 DIAGNOSIS — Z950 Presence of cardiac pacemaker: Secondary | ICD-10-CM

## 2023-01-02 DIAGNOSIS — I5022 Chronic systolic (congestive) heart failure: Secondary | ICD-10-CM | POA: Diagnosis not present

## 2023-01-02 DIAGNOSIS — D6869 Other thrombophilia: Secondary | ICD-10-CM

## 2023-01-02 DIAGNOSIS — I480 Paroxysmal atrial fibrillation: Secondary | ICD-10-CM | POA: Diagnosis not present

## 2023-01-02 DIAGNOSIS — I428 Other cardiomyopathies: Secondary | ICD-10-CM | POA: Diagnosis not present

## 2023-01-02 LAB — CUP PACEART INCLINIC DEVICE CHECK
Battery Remaining Longevity: 64 mo
Battery Voltage: 3.01 V
Brady Statistic RA Percent Paced: 43 %
Brady Statistic RV Percent Paced: 99.91 %
Date Time Interrogation Session: 20240424130005
Implantable Lead Connection Status: 753985
Implantable Lead Connection Status: 753985
Implantable Lead Connection Status: 753985
Implantable Lead Implant Date: 20010807
Implantable Lead Implant Date: 20010807
Implantable Lead Implant Date: 20140307
Implantable Lead Location: 753858
Implantable Lead Location: 753859
Implantable Lead Location: 753860
Implantable Pulse Generator Implant Date: 20220221
Lead Channel Impedance Value: 287.5 Ohm
Lead Channel Impedance Value: 337.5 Ohm
Lead Channel Impedance Value: 612.5 Ohm
Lead Channel Pacing Threshold Amplitude: 0.5 V
Lead Channel Pacing Threshold Amplitude: 0.5 V
Lead Channel Pacing Threshold Amplitude: 0.75 V
Lead Channel Pacing Threshold Amplitude: 0.875 V
Lead Channel Pacing Threshold Pulse Width: 0.4 ms
Lead Channel Pacing Threshold Pulse Width: 0.4 ms
Lead Channel Pacing Threshold Pulse Width: 0.4 ms
Lead Channel Pacing Threshold Pulse Width: 0.4 ms
Lead Channel Sensing Intrinsic Amplitude: 12 mV
Lead Channel Sensing Intrinsic Amplitude: 3.5 mV
Lead Channel Setting Pacing Amplitude: 2 V
Lead Channel Setting Pacing Amplitude: 2 V
Lead Channel Setting Pacing Amplitude: 2 V
Lead Channel Setting Pacing Pulse Width: 0.4 ms
Lead Channel Setting Pacing Pulse Width: 0.4 ms
Lead Channel Setting Sensing Sensitivity: 5 mV
Pulse Gen Model: 3222
Pulse Gen Serial Number: 9194565

## 2023-01-02 NOTE — Patient Instructions (Signed)
Medication Instructions:   Your physician recommends that you continue on your current medications as directed. Please refer to the Current Medication list given to you today.  *If you need a refill on your cardiac medications before your next appointment, please call your pharmacy*   Lab Work:  BMET AND CBC TODAY    If you have labs (blood work) drawn today and your tests are completely normal, you will receive your results only by: MyChart Message (if you have MyChart) OR A paper copy in the mail If you have any lab test that is abnormal or we need to change your treatment, we will call you to review the results.   Testing/Procedures:  NONE ORDERED  TODAY     Follow-Up: At [facility], you and your health needs are our priority.  As part of our continuing mission to provide you with exceptional heart care, we have created designated Provider Care Teams.  These Care Teams include your primary Cardiologist (physician) and Advanced Practice Providers (APPs -  Physician Assistants and Nurse Practitioners) who all work together to provide you with the care you need, when you need it.  We recommend signing up for the patient portal called "MyChart".  Sign up information is provided on this After Visit Summary.  MyChart is used to connect with patients for Virtual Visits (Telemedicine).  Patients are able to view lab/test results, encounter notes, upcoming appointments, etc.  Non-urgent messages can be sent to your provider as well.   To learn more about what you can do with MyChart, go to [email or web address].    Your next appointment:   6 month(s)  Provider:   You may see DR.Ladona Ridgel  or one of the following Warehouse manager Providers on your designated Care Team:   Francis Dowse, [place] Casimiro Needle "Mardelle Matte" Lanna Poche, [place]  Other Instructions

## 2023-01-03 LAB — CBC
Hematocrit: 46.9 % (ref 37.5–51.0)
Hemoglobin: 15.4 g/dL (ref 13.0–17.7)
MCH: 28.1 pg (ref 26.6–33.0)
MCHC: 32.8 g/dL (ref 31.5–35.7)
MCV: 85 fL (ref 79–97)
Platelets: 214 10*3/uL (ref 150–450)
RBC: 5.49 x10E6/uL (ref 4.14–5.80)
RDW: 13 % (ref 11.6–15.4)
WBC: 5.2 10*3/uL (ref 3.4–10.8)

## 2023-01-03 LAB — BASIC METABOLIC PANEL
BUN/Creatinine Ratio: 11 (ref 10–24)
BUN: 13 mg/dL (ref 8–27)
CO2: 24 mmol/L (ref 20–29)
Calcium: 9.8 mg/dL (ref 8.6–10.2)
Chloride: 103 mmol/L (ref 96–106)
Creatinine, Ser: 1.18 mg/dL (ref 0.76–1.27)
Glucose: 149 mg/dL — ABNORMAL HIGH (ref 70–99)
Potassium: 4 mmol/L (ref 3.5–5.2)
Sodium: 141 mmol/L (ref 134–144)
eGFR: 66 mL/min/{1.73_m2} (ref >59)

## 2023-01-22 DIAGNOSIS — R195 Other fecal abnormalities: Secondary | ICD-10-CM | POA: Diagnosis not present

## 2023-01-22 DIAGNOSIS — K648 Other hemorrhoids: Secondary | ICD-10-CM | POA: Diagnosis not present

## 2023-01-22 DIAGNOSIS — K5730 Diverticulosis of large intestine without perforation or abscess without bleeding: Secondary | ICD-10-CM | POA: Diagnosis not present

## 2023-01-22 DIAGNOSIS — D123 Benign neoplasm of transverse colon: Secondary | ICD-10-CM | POA: Diagnosis not present

## 2023-01-24 DIAGNOSIS — D123 Benign neoplasm of transverse colon: Secondary | ICD-10-CM | POA: Diagnosis not present

## 2023-01-25 NOTE — Progress Notes (Signed)
Remote pacemaker transmission.   

## 2023-02-08 ENCOUNTER — Other Ambulatory Visit: Payer: Self-pay | Admitting: Internal Medicine

## 2023-02-08 DIAGNOSIS — I5022 Chronic systolic (congestive) heart failure: Secondary | ICD-10-CM

## 2023-02-11 ENCOUNTER — Other Ambulatory Visit: Payer: Self-pay

## 2023-02-11 DIAGNOSIS — I480 Paroxysmal atrial fibrillation: Secondary | ICD-10-CM

## 2023-02-11 MED ORDER — APIXABAN 5 MG PO TABS: ORAL_TABLET | ORAL | 1 refills | Status: DC

## 2023-02-11 NOTE — Telephone Encounter (Signed)
Prescription refill request for Eliquis received. Indication:afib Last office visit:4/24 Scr:1.18  4/24 Age: 72 Weight:105.5  kg  Prescription refilled

## 2023-02-12 ENCOUNTER — Other Ambulatory Visit: Payer: Self-pay

## 2023-02-12 DIAGNOSIS — I480 Paroxysmal atrial fibrillation: Secondary | ICD-10-CM

## 2023-02-12 MED ORDER — APIXABAN 5 MG PO TABS: ORAL_TABLET | ORAL | 1 refills | Status: DC

## 2023-02-12 NOTE — Telephone Encounter (Signed)
Pt's wife calling requesting pt's medication be resent to a different pharmacy, [facility] mail order pharmacy. Medication Eliquis was resent to pt's preferred pharmacy as requested. Confirmation received.

## 2023-03-29 ENCOUNTER — Ambulatory Visit (INDEPENDENT_AMBULATORY_CARE_PROVIDER_SITE_OTHER): Payer: Medicare Other

## 2023-03-29 DIAGNOSIS — I428 Other cardiomyopathies: Secondary | ICD-10-CM

## 2023-03-31 LAB — CUP PACEART REMOTE DEVICE CHECK
Battery Remaining Longevity: 61 mo
Battery Remaining Percentage: 65 %
Battery Voltage: 3.01 V
Brady Statistic AP VP Percent: 45 %
Brady Statistic AP VS Percent: 1 %
Brady Statistic AS VP Percent: 55 %
Brady Statistic AS VS Percent: 1 %
Brady Statistic RA Percent Paced: 44 %
Date Time Interrogation Session: 20240719020024
Implantable Lead Connection Status: 753985
Implantable Lead Connection Status: 753985
Implantable Lead Connection Status: 753985
Implantable Lead Implant Date: 20010807
Implantable Lead Implant Date: 20010807
Implantable Lead Implant Date: 20140307
Implantable Lead Location: 753858
Implantable Lead Location: 753859
Implantable Lead Location: 753860
Implantable Pulse Generator Implant Date: 20220221
Lead Channel Impedance Value: 260 Ohm
Lead Channel Impedance Value: 330 Ohm
Lead Channel Impedance Value: 600 Ohm
Lead Channel Pacing Threshold Amplitude: 0.5 V
Lead Channel Pacing Threshold Amplitude: 0.625 V
Lead Channel Pacing Threshold Amplitude: 0.875 V
Lead Channel Pacing Threshold Pulse Width: 0.4 ms
Lead Channel Pacing Threshold Pulse Width: 0.4 ms
Lead Channel Pacing Threshold Pulse Width: 0.4 ms
Lead Channel Sensing Intrinsic Amplitude: 12 mV
Lead Channel Sensing Intrinsic Amplitude: 2.5 mV
Lead Channel Setting Pacing Amplitude: 2 V
Lead Channel Setting Pacing Amplitude: 2 V
Lead Channel Setting Pacing Amplitude: 2 V
Lead Channel Setting Pacing Pulse Width: 0.4 ms
Lead Channel Setting Pacing Pulse Width: 0.4 ms
Lead Channel Setting Sensing Sensitivity: 5 mV
Pulse Gen Model: 3222
Pulse Gen Serial Number: 9194565

## 2023-04-11 NOTE — Progress Notes (Signed)
Remote pacemaker transmission.   

## 2023-05-09 ENCOUNTER — Other Ambulatory Visit: Payer: Self-pay | Admitting: Internal Medicine

## 2023-05-09 DIAGNOSIS — I5022 Chronic systolic (congestive) heart failure: Secondary | ICD-10-CM

## 2023-05-09 DIAGNOSIS — I10 Essential (primary) hypertension: Secondary | ICD-10-CM

## 2023-05-09 MED ORDER — LOSARTAN POTASSIUM 25 MG PO TABS
25.0000 mg | ORAL_TABLET | Freq: Every day | ORAL | 2 refills | Status: DC
Start: 2023-05-09 — End: 2024-01-30

## 2023-06-12 DIAGNOSIS — R972 Elevated prostate specific antigen [PSA]: Secondary | ICD-10-CM | POA: Diagnosis not present

## 2023-06-13 ENCOUNTER — Other Ambulatory Visit: Payer: Self-pay | Admitting: Internal Medicine

## 2023-06-19 DIAGNOSIS — R351 Nocturia: Secondary | ICD-10-CM | POA: Diagnosis not present

## 2023-06-19 DIAGNOSIS — R972 Elevated prostate specific antigen [PSA]: Secondary | ICD-10-CM | POA: Diagnosis not present

## 2023-06-19 DIAGNOSIS — N401 Enlarged prostate with lower urinary tract symptoms: Secondary | ICD-10-CM | POA: Diagnosis not present

## 2023-06-26 ENCOUNTER — Other Ambulatory Visit: Payer: Self-pay

## 2023-06-26 MED ORDER — NIACIN ER (ANTIHYPERLIPIDEMIC) 500 MG PO TBCR: 500.0000 mg | EXTENDED_RELEASE_TABLET | Freq: Every day | ORAL | 1 refills | Status: DC

## 2023-06-28 ENCOUNTER — Ambulatory Visit (INDEPENDENT_AMBULATORY_CARE_PROVIDER_SITE_OTHER): Payer: Medicare Other

## 2023-06-28 DIAGNOSIS — I428 Other cardiomyopathies: Secondary | ICD-10-CM | POA: Diagnosis not present

## 2023-06-28 LAB — CUP PACEART REMOTE DEVICE CHECK
Battery Remaining Longevity: 59 mo
Battery Remaining Percentage: 62 %
Battery Voltage: 3.01 V
Brady Statistic AP VP Percent: 44 %
Brady Statistic AP VS Percent: 1 %
Brady Statistic AS VP Percent: 56 %
Brady Statistic AS VS Percent: 1 %
Brady Statistic RA Percent Paced: 44 %
Date Time Interrogation Session: 20241018020013
Implantable Lead Connection Status: 753985
Implantable Lead Connection Status: 753985
Implantable Lead Connection Status: 753985
Implantable Lead Implant Date: 20010807
Implantable Lead Implant Date: 20010807
Implantable Lead Implant Date: 20140307
Implantable Lead Location: 753858
Implantable Lead Location: 753859
Implantable Lead Location: 753860
Implantable Pulse Generator Implant Date: 20220221
Lead Channel Impedance Value: 290 Ohm
Lead Channel Impedance Value: 340 Ohm
Lead Channel Impedance Value: 610 Ohm
Lead Channel Pacing Threshold Amplitude: 0.5 V
Lead Channel Pacing Threshold Amplitude: 0.625 V
Lead Channel Pacing Threshold Amplitude: 0.75 V
Lead Channel Pacing Threshold Pulse Width: 0.4 ms
Lead Channel Pacing Threshold Pulse Width: 0.4 ms
Lead Channel Pacing Threshold Pulse Width: 0.4 ms
Lead Channel Sensing Intrinsic Amplitude: 12 mV
Lead Channel Sensing Intrinsic Amplitude: 2.7 mV
Lead Channel Setting Pacing Amplitude: 2 V
Lead Channel Setting Pacing Amplitude: 2 V
Lead Channel Setting Pacing Amplitude: 2 V
Lead Channel Setting Pacing Pulse Width: 0.4 ms
Lead Channel Setting Pacing Pulse Width: 0.4 ms
Lead Channel Setting Sensing Sensitivity: 5 mV
Pulse Gen Model: 3222
Pulse Gen Serial Number: 9194565

## 2023-07-12 NOTE — Progress Notes (Signed)
Remote pacemaker transmission.   

## 2023-08-13 ENCOUNTER — Encounter: Payer: Self-pay | Admitting: Internal Medicine

## 2023-08-13 ENCOUNTER — Ambulatory Visit: Payer: Medicare Other | Attending: Internal Medicine | Admitting: Internal Medicine

## 2023-08-13 VITALS — BP 114/62 | HR 65 | Ht 70.5 in | Wt 233.8 lb

## 2023-08-13 DIAGNOSIS — I442 Atrioventricular block, complete: Secondary | ICD-10-CM | POA: Diagnosis not present

## 2023-08-13 DIAGNOSIS — I480 Paroxysmal atrial fibrillation: Secondary | ICD-10-CM | POA: Diagnosis not present

## 2023-08-13 LAB — CUP PACEART INCLINIC DEVICE CHECK
Battery Remaining Longevity: 57 mo
Battery Voltage: 3.01 V
Brady Statistic RA Percent Paced: 42 %
Brady Statistic RV Percent Paced: 99.94 %
Date Time Interrogation Session: 20241203170737
Implantable Lead Connection Status: 753985
Implantable Lead Connection Status: 753985
Implantable Lead Connection Status: 753985
Implantable Lead Implant Date: 20010807
Implantable Lead Implant Date: 20010807
Implantable Lead Implant Date: 20140307
Implantable Lead Location: 753858
Implantable Lead Location: 753859
Implantable Lead Location: 753860
Implantable Pulse Generator Implant Date: 20220221
Lead Channel Impedance Value: 287.5 Ohm
Lead Channel Impedance Value: 337.5 Ohm
Lead Channel Impedance Value: 612.5 Ohm
Lead Channel Pacing Threshold Amplitude: 0.5 V
Lead Channel Pacing Threshold Amplitude: 0.5 V
Lead Channel Pacing Threshold Amplitude: 0.75 V
Lead Channel Pacing Threshold Amplitude: 0.75 V
Lead Channel Pacing Threshold Amplitude: 1 V
Lead Channel Pacing Threshold Amplitude: 1 V
Lead Channel Pacing Threshold Pulse Width: 0.4 ms
Lead Channel Pacing Threshold Pulse Width: 0.4 ms
Lead Channel Pacing Threshold Pulse Width: 0.4 ms
Lead Channel Pacing Threshold Pulse Width: 0.4 ms
Lead Channel Pacing Threshold Pulse Width: 0.4 ms
Lead Channel Pacing Threshold Pulse Width: 0.4 ms
Lead Channel Sensing Intrinsic Amplitude: 0 mV
Lead Channel Sensing Intrinsic Amplitude: 12 mV
Lead Channel Sensing Intrinsic Amplitude: 3.1 mV
Lead Channel Setting Pacing Amplitude: 2 V
Lead Channel Setting Pacing Amplitude: 2 V
Lead Channel Setting Pacing Amplitude: 2 V
Lead Channel Setting Pacing Pulse Width: 0.4 ms
Lead Channel Setting Pacing Pulse Width: 0.4 ms
Lead Channel Setting Sensing Sensitivity: 5 mV
Pulse Gen Model: 3222
Pulse Gen Serial Number: 9194565

## 2023-08-13 NOTE — Progress Notes (Signed)
HPI [name] returns today for evaluation. He is a pleasant 72 yo man with a h/o chronic systolic heart failure, s/p biv PPM insertion, who has chronic systolic heart failure, and CHB. He denies chest pain but has felt some palpitations. He has not had syncope. His dyspnea has worsened some. He has a h/o MV repair and improvement of his LV function after undergoing biv upgrade. He has not had syncope. Minimal edema. He underwent gen change out several years ago with relocation of his pocket. He feels well and denies chest pain or sob.  No Known Allergies   Current Outpatient Medications  Medication Sig Dispense Refill   amLODipine (NORVASC) 10 MG tablet TAKE 1 TABLET(10 MG) BY MOUTH DAILY 90 tablet 2   apixaban (ELIQUIS) 5 MG TABS tablet TAKE 1 TABLET(5 MG) BY MOUTH TWICE DAILY 180 tablet 1   aspirin EC 81 MG tablet Take 1 tablet (81 mg total) by mouth daily.     BIOTIN PO Take by mouth daily.     BLACK CURRANT SEED OIL PO Take 25 mg by mouth daily.     carvedilol (COREG) 25 MG tablet TAKE 1 TABLET BY MOUTH TWICE DAILY 180 tablet 2   cholecalciferol (VITAMIN D) 1000 units tablet Take 1,000 Units by mouth daily.     finasteride (PROSCAR) 5 MG tablet Take 5 mg by mouth daily.     losartan (COZAAR) 25 MG tablet Take 1 tablet (25 mg total) by mouth daily. 90 tablet 2   meloxicam (MOBIC) 15 MG tablet TAKE 1 TABLET(15 MG) BY MOUTH DAILY 30 tablet 0   Multiple Vitamin (MULTIVITAMIN WITH MINERALS) TABS Take 1 tablet by mouth daily. Liquid / one cap full     niacin (VITAMIN B3) 500 MG ER tablet Take 1 tablet (500 mg total) by mouth at bedtime. 90 tablet 1   simvastatin (ZOCOR) 80 MG tablet TAKE 1 TABLET(80 MG) BY MOUTH AT BEDTIME 90 tablet 1   vitamin B-12 (CYANOCOBALAMIN) 1000 MCG tablet Take 1,000 mcg by mouth daily.     No current facility-administered medications for this visit.     Past Medical History:  Diagnosis Date   Cardiomyopathy (HCC) 1.10.14   reduced EF 30-35%   CHB  (complete heart block) (HCC)    a. Pacer initially placed ~2002.    CHF (congestive heart failure) (HCC)    a. NICM EF 35% - no CAD by cath 09/2012. b. s/p upgrade to Bi-V pacemaker 11/2012 (did not get ICD as Dr. Ladona Ridgel expects his EF will improve with bi-V pacing).   Dyspnea    2d Echo, EF 40-45%, 5.20.14   Hyperlipidemia    Hypertension    pt denies HTN   LBBB (left bundle branch block)    Myoview, LV enlarged   Mitral valve regurgitation    Pacemaker    st jude    greg Taaj Hurlbut   RBBB    S/P mitral valve repair 04/29/2013   28mm Sorin Memo 3D ring annuloplasty via right mini thoracotomy   S/P tricuspid valve repair 04/29/2013   28 mm Edwards mc3 ring annuloplasty via right mini thoracotomy    ROS:   All systems reviewed and negative except as noted in the HPI.   Past Surgical History:  Procedure Laterality Date   A-FLUTTER ABLATION N/A 04/07/2018   Procedure: A-FLUTTER ABLATION;  Surgeon: [name], MD;  Location: [facility] INVASIVE CV LAB;  Service: Cardiovascular;  Laterality: N/A;   BI-VENTRICULAR PACEMAKER  UPGRADE N/A 11/14/2012   Procedure: BI-VENTRICULAR PACEMAKER UPGRADE;  Surgeon: [name], MD;  Location: [facility] CATH LAB;  Service: Cardiovascular;  Laterality: N/A;   BIV PACEMAKER GENERATOR CHANGEOUT N/A 10/31/2020   Procedure: BIV PACEMAKER GENERATOR CHANGEOUT;  Surgeon: [name], MD;  Location: [facility] INVASIVE CV LAB;  Service: Cardiovascular;  Laterality: N/A;   CARDIAC CATHETERIZATION     clean   INTRAOPERATIVE TRANSESOPHAGEAL ECHOCARDIOGRAM N/A 04/29/2013   Procedure: INTRAOPERATIVE TRANSESOPHAGEAL ECHOCARDIOGRAM;  Surgeon: [name], MD;  Location: [facility] OR;  Service: Open Heart Surgery;  Laterality: N/A;   LEFT AND RIGHT HEART CATHETERIZATION WITH CORONARY ANGIOGRAM N/A 09/19/2012   Procedure: LEFT AND RIGHT HEART CATHETERIZATION WITH CORONARY ANGIOGRAM;  Surgeon: [name], MD;  Location: [facility] CATH LAB;  Service: Cardiovascular;  Laterality: N/A;   MINIMALLY  INVASIVE TRICUSPID VALVE REPAIR Right 04/29/2013   Procedure: MINIMALLY INVASIVE TRICUSPID VALVE REPAIR;  Surgeon: [name], MD;  Location: [facility] OR;  Service: Open Heart Surgery;  Laterality: Right;   MITRAL VALVE REPAIR Right 04/29/2013   Procedure: MINIMALLY INVASIVE MITRAL VALVE REPAIR (MVR);  Surgeon: [name], MD;  Location: [facility] OR;  Service: Open Heart Surgery;  Laterality: Right;   MULTIPLE EXTRACTIONS WITH ALVEOLOPLASTY N/A 03/20/2013   Procedure: Extraction of tooth #'s 1,6-10,16,17, and 20-29 with alveoloplasty;  Surgeon: [name], DDS;  Location: [facility] OR;  Service: Oral Surgery;  Laterality: N/A;   PACEMAKER PLACEMENT     TEE WITHOUT CARDIOVERSION N/A 04/09/2013   Procedure: TRANSESOPHAGEAL ECHOCARDIOGRAM (TEE);  Surgeon: Dolores Patty, MD;  Location: [facility] ENDOSCOPY;  Service: Cardiovascular;  Laterality: N/A;     Family History  Problem Relation Age of Onset   Other Mother        died elderly of unknonw cause   Other Father        died very young, unknown cause   Heart disease Brother    Hypertension Brother    Diabetes Brother    Hypertension Brother      Social History   Socioeconomic History   Marital status: Married    Spouse name: Not on file   Number of children: 1   Years of education: Not on file   Highest education level: Not on file  Occupational History    Employer: [employer]  Tobacco Use   Smoking status: Former   Smokeless tobacco: Never  Substance and Sexual Activity   Alcohol use: No   Drug use: No   Sexual activity: Yes  Other Topics Concern   Not on file  Social History Narrative   Patient is married. Patient has one grown son and 4 grandsons that live in [place].   Patient with a history of smoking but quit approximately 10 years ago. Patient denies use of alcohol or other illicit drugs. Patient has never used smokeless tobacco.   Social Determinants of Health   Financial Resource Strain: Not on file  Food  Insecurity: Not on file  Transportation Needs: Not on file  Physical Activity: Not on file  Stress: Not on file  Social Connections: Not on file  Intimate Partner Violence: Not on file     BP 114/62   Pulse 65   Ht 5' 10.5" (1.791 m)   Wt 233 lb 12.8 oz (106.1 kg)   SpO2 98%   BMI 33.07 kg/m   Physical Exam:  Well appearing NAD HEENT: Unremarkable Neck:  No JVD, no thyromegally Lymphatics:  No adenopathy Back:  No CVA tenderness Lungs:  Clear HEART:  Regular rate rhythm, no murmurs, no rubs, no clicks Abd:  soft, positive bowel sounds, no organomegally, no rebound, no guarding Ext:  2 plus pulses, no edema, no cyanosis, no clubbing Skin:  No rashes no nodules Neuro:  CN II through XII intact, motor grossly intact  EKG - nsr with biv pacing  DEVICE  Normal device function.  See PaceArt for details.   Assess/Plan:  1. Chronic systolic heart failure - he is class 2. He will continue his current meds. 2. PAF - he is maintaining NSR. He will continue Eliquis. 3. CHB - he is asymptomatic. No change. 4. PPM - his St. Jude Biv PPM is working normally. We will recheck in several months.    Leonia Reeves.D.

## 2023-08-13 NOTE — Patient Instructions (Addendum)

## 2023-08-19 DIAGNOSIS — Z0000 Encounter for general adult medical examination without abnormal findings: Secondary | ICD-10-CM | POA: Diagnosis not present

## 2023-08-19 DIAGNOSIS — Z23 Encounter for immunization: Secondary | ICD-10-CM | POA: Diagnosis not present

## 2023-08-19 DIAGNOSIS — I10 Essential (primary) hypertension: Secondary | ICD-10-CM | POA: Diagnosis not present

## 2023-08-19 DIAGNOSIS — I429 Cardiomyopathy, unspecified: Secondary | ICD-10-CM | POA: Diagnosis not present

## 2023-08-19 DIAGNOSIS — I428 Other cardiomyopathies: Secondary | ICD-10-CM | POA: Diagnosis not present

## 2023-08-19 DIAGNOSIS — I4891 Unspecified atrial fibrillation: Secondary | ICD-10-CM | POA: Diagnosis not present

## 2023-08-19 DIAGNOSIS — E7800 Pure hypercholesterolemia, unspecified: Secondary | ICD-10-CM | POA: Diagnosis not present

## 2023-08-19 DIAGNOSIS — D6869 Other thrombophilia: Secondary | ICD-10-CM | POA: Diagnosis not present

## 2023-08-19 DIAGNOSIS — M109 Gout, unspecified: Secondary | ICD-10-CM | POA: Diagnosis not present

## 2023-08-22 ENCOUNTER — Telehealth: Payer: Self-pay | Admitting: Internal Medicine

## 2023-08-22 DIAGNOSIS — I480 Paroxysmal atrial fibrillation: Secondary | ICD-10-CM

## 2023-08-22 MED ORDER — APIXABAN 5 MG PO TABS
ORAL_TABLET | ORAL | 1 refills | Status: DC
Start: 2023-08-22 — End: 2024-02-28

## 2023-08-22 NOTE — Telephone Encounter (Signed)
*  STAT* If patient is at the pharmacy, call can be transferred to refill team.   1. Which medications need to be refilled? (please list name of each medication and dose if known)  apixaban (ELIQUIS) 5 MG TABS tablet   2. Which pharmacy/location (including street and city if local pharmacy) is medication to be sent to?  [facility] - [place], [place] - [address] Phone: [phone]  Fax: [phone]      3. Do they need a 30 day or 90 day supply? 90

## 2023-08-22 NOTE — Telephone Encounter (Signed)
Prescription refill request for Eliquis received. Indication: Afib  Last office visit: 08/13/23 Ladona Ridgel)  Scr: 1.18 (01/02/23)  Age: 72 Weight: 106.1kg  Appropriate dose. Refill sent.

## 2023-09-27 ENCOUNTER — Ambulatory Visit: Payer: Medicare Other

## 2023-09-27 DIAGNOSIS — I428 Other cardiomyopathies: Secondary | ICD-10-CM

## 2023-09-27 LAB — CUP PACEART REMOTE DEVICE CHECK
Battery Remaining Longevity: 55 mo
Battery Remaining Percentage: 59 %
Battery Voltage: 3.01 V
Brady Statistic AP VP Percent: 48 %
Brady Statistic AP VS Percent: 1 %
Brady Statistic AS VP Percent: 52 %
Brady Statistic AS VS Percent: 1 %
Brady Statistic RA Percent Paced: 48 %
Date Time Interrogation Session: 20250117092523
Implantable Lead Connection Status: 753985
Implantable Lead Connection Status: 753985
Implantable Lead Connection Status: 753985
Implantable Lead Implant Date: 20010807
Implantable Lead Implant Date: 20010807
Implantable Lead Implant Date: 20140307
Implantable Lead Location: 753858
Implantable Lead Location: 753859
Implantable Lead Location: 753860
Implantable Pulse Generator Implant Date: 20220221
Lead Channel Impedance Value: 260 Ohm
Lead Channel Impedance Value: 330 Ohm
Lead Channel Impedance Value: 600 Ohm
Lead Channel Pacing Threshold Amplitude: 0.5 V
Lead Channel Pacing Threshold Amplitude: 0.625 V
Lead Channel Pacing Threshold Amplitude: 0.875 V
Lead Channel Pacing Threshold Pulse Width: 0.4 ms
Lead Channel Pacing Threshold Pulse Width: 0.4 ms
Lead Channel Pacing Threshold Pulse Width: 0.4 ms
Lead Channel Sensing Intrinsic Amplitude: 11.8 mV
Lead Channel Sensing Intrinsic Amplitude: 2.8 mV
Lead Channel Setting Pacing Amplitude: 2 V
Lead Channel Setting Pacing Amplitude: 2 V
Lead Channel Setting Pacing Amplitude: 2 V
Lead Channel Setting Pacing Pulse Width: 0.4 ms
Lead Channel Setting Pacing Pulse Width: 0.4 ms
Lead Channel Setting Sensing Sensitivity: 5 mV
Pulse Gen Model: 3222
Pulse Gen Serial Number: 9194565

## 2023-09-30 ENCOUNTER — Encounter: Payer: Self-pay | Admitting: Internal Medicine

## 2023-11-01 NOTE — Progress Notes (Signed)
 Remote pacemaker transmission.

## 2023-12-15 ENCOUNTER — Other Ambulatory Visit: Payer: Self-pay | Admitting: Internal Medicine

## 2023-12-16 ENCOUNTER — Encounter

## 2023-12-24 ENCOUNTER — Other Ambulatory Visit: Payer: Self-pay | Admitting: Physician Assistant

## 2024-01-30 ENCOUNTER — Other Ambulatory Visit: Payer: Self-pay | Admitting: Internal Medicine

## 2024-01-30 DIAGNOSIS — I5022 Chronic systolic (congestive) heart failure: Secondary | ICD-10-CM

## 2024-01-30 DIAGNOSIS — I10 Essential (primary) hypertension: Secondary | ICD-10-CM

## 2024-02-19 ENCOUNTER — Telehealth: Payer: Self-pay

## 2024-02-19 NOTE — Telephone Encounter (Signed)
 Alert received from [employer] for Atrial pacing lead impedance out of range, <100 ohms - route to triage. 16 AMS c/w oversensing of atrial lead noise, all < 20sec - know.  Known RA lead noise, tracks appropriately. Hx of this is same and per notes, Dr. Carolynne Citron discussed pre-gen change, risk of A lead revision higher then benefit and not pursued.

## 2024-02-28 ENCOUNTER — Other Ambulatory Visit: Payer: Self-pay | Admitting: Internal Medicine

## 2024-02-28 DIAGNOSIS — I480 Paroxysmal atrial fibrillation: Secondary | ICD-10-CM

## 2024-02-28 DIAGNOSIS — I10 Essential (primary) hypertension: Secondary | ICD-10-CM

## 2024-02-28 MED ORDER — APIXABAN 5 MG PO TABS: ORAL_TABLET | ORAL | 1 refills | Status: DC

## 2024-02-28 MED ORDER — LOSARTAN POTASSIUM 25 MG PO TABS: 25.0000 mg | ORAL_TABLET | Freq: Every day | ORAL | 1 refills | Status: DC

## 2024-02-28 NOTE — Telephone Encounter (Signed)
 Eliquis  5mg  refill request received. Patient is 73 years old, weight-106.1kg, Crea-1.25 on 08/19/23 via [facility]where from Dr. Aldona Hunger office at [place], [place], and last seen by Dr. Carolynne Citron on 08/13/23. Dose is appropriate based on dosing criteria. Will send in refill to requested pharmacy.

## 2024-02-28 NOTE — Telephone Encounter (Signed)
*  STAT* If patient is at the pharmacy, call can be transferred to refill team.   1. Which medications need to be refilled? (please list name of each medication and dose if known) Eliquis  and Losartan    2. Would you like to learn more about the convenience, safety, & potential cost savings by using the [facility]?   3. Are you open to using the [facility] (Type [facility].   4. Which pharmacy/location (including street and city if local pharmacy) is medication to be sent to?[facility]   5. Do they need a 30 day or 90 day supply? 90 days and refills

## 2024-02-28 NOTE — Telephone Encounter (Signed)
 Pt's medication was sent to pt's pharmacy as requested. Confirmation received.

## 2024-03-16 ENCOUNTER — Encounter

## 2024-03-27 ENCOUNTER — Encounter

## 2024-06-12 DIAGNOSIS — R972 Elevated prostate specific antigen [PSA]: Secondary | ICD-10-CM | POA: Diagnosis not present

## 2024-06-15 ENCOUNTER — Encounter

## 2024-06-19 DIAGNOSIS — R350 Frequency of micturition: Secondary | ICD-10-CM | POA: Diagnosis not present

## 2024-06-19 DIAGNOSIS — R972 Elevated prostate specific antigen [PSA]: Secondary | ICD-10-CM | POA: Diagnosis not present

## 2024-06-19 DIAGNOSIS — R3914 Feeling of incomplete bladder emptying: Secondary | ICD-10-CM | POA: Diagnosis not present

## 2024-06-19 DIAGNOSIS — N401 Enlarged prostate with lower urinary tract symptoms: Secondary | ICD-10-CM | POA: Diagnosis not present

## 2024-06-26 ENCOUNTER — Encounter

## 2024-07-03 ENCOUNTER — Telehealth: Payer: Self-pay

## 2024-07-03 NOTE — Telephone Encounter (Signed)
 Pt called in stating that his remote monitor is not working. Pt was told he needs to call tech support so that they can troubleshoot and then if that doesn't work then they will send him a new one. Merlin wont send a new monitor until they try and troubleshoot

## 2024-07-07 ENCOUNTER — Ambulatory Visit

## 2024-07-07 DIAGNOSIS — I480 Paroxysmal atrial fibrillation: Secondary | ICD-10-CM | POA: Diagnosis not present

## 2024-07-07 NOTE — Telephone Encounter (Signed)
 Pt called back and we have gotten the monitor set up and working again

## 2024-07-08 LAB — CUP PACEART REMOTE DEVICE CHECK
Battery Remaining Longevity: 46 mo
Battery Remaining Percentage: 49 %
Battery Voltage: 2.99 V
Brady Statistic AP VP Percent: 47 %
Brady Statistic AP VS Percent: 1 %
Brady Statistic AS VP Percent: 53 %
Brady Statistic AS VS Percent: 1 %
Brady Statistic RA Percent Paced: 46 %
Date Time Interrogation Session: 20251028162042
Implantable Lead Connection Status: 753985
Implantable Lead Connection Status: 753985
Implantable Lead Connection Status: 753985
Implantable Lead Implant Date: 20010807
Implantable Lead Implant Date: 20010807
Implantable Lead Implant Date: 20140307
Implantable Lead Location: 753858
Implantable Lead Location: 753859
Implantable Lead Location: 753860
Implantable Pulse Generator Implant Date: 20220221
Lead Channel Impedance Value: 260 Ohm
Lead Channel Impedance Value: 300 Ohm
Lead Channel Impedance Value: 600 Ohm
Lead Channel Pacing Threshold Amplitude: 0.5 V
Lead Channel Pacing Threshold Amplitude: 0.625 V
Lead Channel Pacing Threshold Amplitude: 0.75 V
Lead Channel Pacing Threshold Pulse Width: 0.4 ms
Lead Channel Pacing Threshold Pulse Width: 0.4 ms
Lead Channel Pacing Threshold Pulse Width: 0.4 ms
Lead Channel Sensing Intrinsic Amplitude: 12 mV
Lead Channel Sensing Intrinsic Amplitude: 2.4 mV
Lead Channel Setting Pacing Amplitude: 2 V
Lead Channel Setting Pacing Amplitude: 2 V
Lead Channel Setting Pacing Amplitude: 2 V
Lead Channel Setting Pacing Pulse Width: 0.4 ms
Lead Channel Setting Pacing Pulse Width: 0.4 ms
Lead Channel Setting Sensing Sensitivity: 5 mV
Pulse Gen Model: 3222
Pulse Gen Serial Number: 9194565

## 2024-07-08 NOTE — Progress Notes (Signed)
 Remote PPM Transmission

## 2024-07-16 ENCOUNTER — Ambulatory Visit: Payer: Self-pay | Admitting: Internal Medicine

## 2024-09-08 ENCOUNTER — Telehealth: Payer: Self-pay | Admitting: Internal Medicine

## 2024-09-08 NOTE — Telephone Encounter (Signed)
" °*  STAT* If patient is at the pharmacy, call can be transferred to refill team.   1. Which medications need to be refilled? (please list name of each medication and dose if known) apixaban  (ELIQUIS ) 5 MG TABS tablet    2. Would you like to learn more about the convenience, safety, & potential cost savings by using the [facility]?    3. Are you open to using the [facility] (Type [facility]. ).   4. Which pharmacy/location (including street and city if local pharmacy) is medication to be sent to? [facility] - [place], [place] - [address]    5. Do they need a 30 day or 90 day supply? 90 day  "

## 2024-09-09 ENCOUNTER — Ambulatory Visit: Admitting: Internal Medicine

## 2024-09-09 ENCOUNTER — Encounter: Payer: Self-pay | Admitting: Internal Medicine

## 2024-09-09 VITALS — BP 130/66 | HR 66 | Ht 70.0 in | Wt 233.0 lb

## 2024-09-09 DIAGNOSIS — I10 Essential (primary) hypertension: Secondary | ICD-10-CM

## 2024-09-09 DIAGNOSIS — I480 Paroxysmal atrial fibrillation: Secondary | ICD-10-CM

## 2024-09-09 DIAGNOSIS — I5022 Chronic systolic (congestive) heart failure: Secondary | ICD-10-CM

## 2024-09-09 LAB — CUP PACEART INCLINIC DEVICE CHECK
Battery Remaining Longevity: 44 mo
Battery Voltage: 2.99 V
Brady Statistic RA Percent Paced: 46 %
Brady Statistic RV Percent Paced: 99.9 %
Date Time Interrogation Session: 20251231214052
Implantable Lead Connection Status: 753985
Implantable Lead Connection Status: 753985
Implantable Lead Connection Status: 753985
Implantable Lead Implant Date: 20010807
Implantable Lead Implant Date: 20010807
Implantable Lead Implant Date: 20140307
Implantable Lead Location: 753858
Implantable Lead Location: 753859
Implantable Lead Location: 753860
Implantable Pulse Generator Implant Date: 20220221
Lead Channel Impedance Value: 275 Ohm
Lead Channel Impedance Value: 337.5 Ohm
Lead Channel Impedance Value: 612.5 Ohm
Lead Channel Pacing Threshold Amplitude: 0.5 V
Lead Channel Pacing Threshold Amplitude: 0.5 V
Lead Channel Pacing Threshold Amplitude: 0.75 V
Lead Channel Pacing Threshold Amplitude: 0.75 V
Lead Channel Pacing Threshold Amplitude: 0.75 V
Lead Channel Pacing Threshold Pulse Width: 0.4 ms
Lead Channel Pacing Threshold Pulse Width: 0.4 ms
Lead Channel Pacing Threshold Pulse Width: 0.4 ms
Lead Channel Pacing Threshold Pulse Width: 0.4 ms
Lead Channel Pacing Threshold Pulse Width: 0.4 ms
Lead Channel Sensing Intrinsic Amplitude: 12 mV
Lead Channel Sensing Intrinsic Amplitude: 3.2 mV
Lead Channel Setting Pacing Amplitude: 2 V
Lead Channel Setting Pacing Amplitude: 2 V
Lead Channel Setting Pacing Amplitude: 2 V
Lead Channel Setting Pacing Pulse Width: 0.4 ms
Lead Channel Setting Pacing Pulse Width: 0.4 ms
Lead Channel Setting Sensing Sensitivity: 5 mV
Pulse Gen Model: 3222
Pulse Gen Serial Number: 9194565

## 2024-09-09 MED ORDER — AMLODIPINE BESYLATE 10 MG PO TABS: 10.0000 mg | ORAL_TABLET | Freq: Every day | ORAL | 3 refills | Status: AC

## 2024-09-09 MED ORDER — LOSARTAN POTASSIUM 25 MG PO TABS: 25.0000 mg | ORAL_TABLET | Freq: Every day | ORAL | 3 refills | Status: AC

## 2024-09-09 MED ORDER — CARVEDILOL 25 MG PO TABS: 25.0000 mg | ORAL_TABLET | Freq: Two times a day (BID) | ORAL | 3 refills | Status: AC

## 2024-09-09 NOTE — Addendum Note (Signed)
 Addended by: Vikrant Pryce N on: 09/09/2024 04:19 PM   Modules accepted: Orders

## 2024-09-09 NOTE — Patient Instructions (Addendum)
 Medication Instructions:  Your physician has recommended you make the following change in your medication:   Stop eliquis  Start Aspirin  81mg  daily.   Lab Work: None ordered.  You may go to any [facility] Location for your lab work:  [facility] - [address] ([facility]) - [address] [phone] [address]  [place] - [address]   [place]  - [address]   [place] [place] [address] - [address] - [address] [place]  - [address] ([facility]   If you have labs (blood work) drawn today and your tests are completely normal, you will receive your results only by: [employer] (if you have MyChart)  If you have any lab test that is abnormal or we need to change your treatment, we will call you or send a MyChart message to review the results.  Testing/Procedures: None ordered.  Follow-Up: At [facility], you and your health needs are our priority.  As part of our continuing mission to provide you with exceptional heart care, we have created designated Provider Care Teams.  These Care Teams include your primary Cardiologist (physician) and Advanced Practice Providers (APPs -  Physician Assistants and Nurse Practitioners) who all work together to provide you with the care you need, when you need it.  We recommend signing up for the patient portal called MyChart.  Sign up information is provided on this After Visit Summary.  MyChart is used to connect with patients for Virtual Visits (Telemedicine).  Patients are able to view lab/test results, encounter notes, upcoming appointments, etc.  Non-urgent messages can be sent to your provider as well.   To learn more about what you can do with MyChart, go to [email or web address].    Your next appointment:   1 year(s)

## 2024-09-09 NOTE — Progress Notes (Addendum)
 "     HPI [name] returns today for evaluation. He is a pleasant 73 yo man with a h/o chronic systolic heart failure, s/p biv PPM insertion, who has chronic systolic heart failure, and CHB. He denies chest pain but has felt some palpitations. He has not had syncope. His dyspnea has worsened some. He has a h/o MV repair and improvement of his LV function after undergoing biv upgrade. He has not had syncope. Minimal edema. He underwent gen change out several years ago with relocation of his pocket. He feels well and denies chest pain or sob.  Allergies[1]   Current Outpatient Medications  Medication Sig Dispense Refill   amLODipine  (NORVASC ) 10 MG tablet TAKE 1 TABLET(10 MG) BY MOUTH DAILY 90 tablet 2   apixaban  (ELIQUIS ) 5 MG TABS tablet TAKE 1 TABLET(5 MG) BY MOUTH TWICE DAILY 180 tablet 1   aspirin  EC 81 MG tablet Take 1 tablet (81 mg total) by mouth daily.     BIOTIN PO Take by mouth daily.     BLACK CURRANT SEED OIL PO Take 25 mg by mouth daily.     carvedilol  (COREG ) 25 MG tablet TAKE 1 TABLET BY MOUTH TWICE DAILY 180 tablet 2   cholecalciferol (VITAMIN D) 1000 units tablet Take 1,000 Units by mouth daily.     finasteride (PROSCAR) 5 MG tablet Take 5 mg by mouth daily.     losartan  (COZAAR ) 25 MG tablet Take 1 tablet (25 mg total) by mouth daily. 90 tablet 1   meloxicam  (MOBIC ) 15 MG tablet TAKE 1 TABLET(15 MG) BY MOUTH DAILY 30 tablet 0   Multiple Vitamin (MULTIVITAMIN WITH MINERALS) TABS Take 1 tablet by mouth daily. Liquid / one cap full     niacin  (VITAMIN B3) 500 MG ER tablet TAKE 1 TABLET(500 MG) BY MOUTH AT BEDTIME 90 tablet 2   simvastatin  (ZOCOR ) 80 MG tablet TAKE 1 TABLET(80 MG) BY MOUTH AT BEDTIME 90 tablet 2   vitamin B-12 (CYANOCOBALAMIN) 1000 MCG tablet Take 1,000 mcg by mouth daily.     No current facility-administered medications for this visit.     Past Medical History:  Diagnosis Date   Cardiomyopathy (HCC) 1.10.14   reduced EF 30-35%   CHB (complete heart  block) (HCC)    a. Pacer initially placed ~2002.    CHF (congestive heart failure) (HCC)    a. NICM EF 35% - no CAD by cath 09/2012. b. s/p upgrade to Bi-V pacemaker 11/2012 (did not get ICD as Dr. Waddell expects his EF will improve with bi-V pacing).   Dyspnea    2d Echo, EF 40-45%, 5.20.14   Hyperlipidemia    Hypertension    pt denies HTN   LBBB (left bundle branch block)    Myoview, LV enlarged   Mitral valve regurgitation    Pacemaker    st jude    Russell Martin   RBBB    S/P mitral valve repair 04/29/2013   28mm Sorin Memo 3D ring annuloplasty via right mini thoracotomy   S/P tricuspid valve repair 04/29/2013   28 mm Edwards mc3 ring annuloplasty via right mini thoracotomy    ROS:   All systems reviewed and negative except as noted in the HPI.   Past Surgical History:  Procedure Laterality Date   A-FLUTTER ABLATION N/A 04/07/2018   Procedure: A-FLUTTER ABLATION;  Surgeon: Waddell Danelle ORN, MD;  Location: [facility] INVASIVE CV LAB;  Service: Cardiovascular;  Laterality: N/A;   BI-VENTRICULAR PACEMAKER UPGRADE N/A 11/14/2012  Procedure: BI-VENTRICULAR PACEMAKER UPGRADE;  Surgeon: Danelle LELON Birmingham, MD;  Location: [facility] CATH LAB;  Service: Cardiovascular;  Laterality: N/A;   BIV PACEMAKER GENERATOR CHANGEOUT N/A 10/31/2020   Procedure: BIV PACEMAKER GENERATOR CHANGEOUT;  Surgeon: Birmingham Danelle LELON, MD;  Location: [facility] INVASIVE CV LAB;  Service: Cardiovascular;  Laterality: N/A;   CARDIAC CATHETERIZATION     clean   INTRAOPERATIVE TRANSESOPHAGEAL ECHOCARDIOGRAM N/A 04/29/2013   Procedure: INTRAOPERATIVE TRANSESOPHAGEAL ECHOCARDIOGRAM;  Surgeon: Sudie VEAR Laine, MD;  Location: [facility] OR;  Service: Open Heart Surgery;  Laterality: N/A;   LEFT AND RIGHT HEART CATHETERIZATION WITH CORONARY ANGIOGRAM N/A 09/19/2012   Procedure: LEFT AND RIGHT HEART CATHETERIZATION WITH CORONARY ANGIOGRAM;  Surgeon: Debby DELENA Sor, MD;  Location: [facility] CATH LAB;  Service: Cardiovascular;  Laterality: N/A;   MINIMALLY INVASIVE  TRICUSPID VALVE REPAIR Right 04/29/2013   Procedure: MINIMALLY INVASIVE TRICUSPID VALVE REPAIR;  Surgeon: Sudie VEAR Laine, MD;  Location: [facility] OR;  Service: Open Heart Surgery;  Laterality: Right;   MITRAL VALVE REPAIR Right 04/29/2013   Procedure: MINIMALLY INVASIVE MITRAL VALVE REPAIR (MVR);  Surgeon: Sudie VEAR Laine, MD;  Location: [facility] OR;  Service: Open Heart Surgery;  Laterality: Right;   MULTIPLE EXTRACTIONS WITH ALVEOLOPLASTY N/A 03/20/2013   Procedure: Extraction of tooth #'s 1,6-10,16,17, and 20-29 with alveoloplasty;  Surgeon: Tanda JULIANNA Fanny, DDS;  Location: [facility] OR;  Service: Oral Surgery;  Laterality: N/A;   PACEMAKER PLACEMENT     TEE WITHOUT CARDIOVERSION N/A 04/09/2013   Procedure: TRANSESOPHAGEAL ECHOCARDIOGRAM (TEE);  Surgeon: Toribio JONELLE Fuel, MD;  Location: [facility] ENDOSCOPY;  Service: Cardiovascular;  Laterality: N/A;     Family History  Problem Relation Age of Onset   Other Mother        died elderly of unknonw cause   Other Father        died very young, unknown cause   Heart disease Brother    Hypertension Brother    Diabetes Brother    Hypertension Brother      Social History   Socioeconomic History   Marital status: Married    Spouse name: Not on file   Number of children: 1   Years of education: Not on file   Highest education level: Not on file  Occupational History    Employer: [employer]  Tobacco Use   Smoking status: Former   Smokeless tobacco: Never  Substance and Sexual Activity   Alcohol use: No   Drug use: No   Sexual activity: Yes  Other Topics Concern   Not on file  Social History Narrative   Patient is married. Patient has one grown son and 4 grandsons that live in [place] .   Patient with a history of smoking but quit approximately 10 years ago. Patient denies use of alcohol or other illicit drugs. Patient has never used smokeless tobacco.   Social Drivers of Health   Tobacco Use: Medium Risk (09/09/2024)   Patient History    Smoking  Tobacco Use: Former    Smokeless Tobacco Use: Never    Passive Exposure: Not on Actuary Strain: Not on file  Food Insecurity: Not on file  Transportation Needs: Not on file  Physical Activity: Not on file  Stress: Not on file  Social Connections: Not on file  Intimate Partner Violence: Not on file  Depression (EYV7-0): Not on file  Alcohol Screen: Not on file  Housing: Not on file  Utilities: Not on file  Health Literacy: Not on file  BP 130/66 (BP Location: Left Arm, Patient Position: Sitting)   Pulse 66   Ht 5' 10 (1.778 m)   Wt 233 lb (105.7 kg)   SpO2 98%   BMI 33.43 kg/m   Physical Exam:  Well appearing NAD HEENT: Unremarkable Neck:  No JVD, no thyromegally Lymphatics:  No adenopathy Back:  No CVA tenderness Lungs:  Clear HEART:  Regular rate rhythm, no murmurs, no rubs, no clicks Abd:  soft, positive bowel sounds, no organomegally, no rebound, no guarding Ext:  2 plus pulses, no edema, no cyanosis, no clubbing Skin:  No rashes no nodules Neuro:  CN II through XII intact, motor grossly intact  EKG - P synchronous ventricular pacing  DEVICE  Normal device function.  See PaceArt for details.   Assess/Plan:  Chronic systolic heart failure - he is class 2. He will continue his current meds. 2. PAF - he is maintaining NSR. He has not had atrial fib since his flutter ablation. He will stop the eliquis  and undergo watchful waiting.  3. CHB - he is asymptomatic. No change. 4. PPM - his St. Jude Biv PPM is working normally. We will recheck in several months.      [place],M.D.     [1] No Known Allergies  "

## 2024-09-14 ENCOUNTER — Encounter

## 2024-09-14 NOTE — Telephone Encounter (Signed)
 Pt's medication Eliquis  5mg  tablets was D/C by provider pt no longer takes this medication.

## 2024-09-25 ENCOUNTER — Encounter

## 2024-10-06 ENCOUNTER — Ambulatory Visit: Attending: Student in an Organized Health Care Education/Training Program

## 2024-10-06 DIAGNOSIS — I428 Other cardiomyopathies: Secondary | ICD-10-CM

## 2024-10-07 ENCOUNTER — Telehealth: Payer: Self-pay | Admitting: Physician Assistant

## 2024-10-07 LAB — CUP PACEART REMOTE DEVICE CHECK
Battery Remaining Longevity: 42 mo
Battery Remaining Percentage: 45 %
Battery Voltage: 2.99 V
Brady Statistic AP VP Percent: 47 %
Brady Statistic AP VS Percent: 1 %
Brady Statistic AS VP Percent: 53 %
Brady Statistic AS VS Percent: 1 %
Brady Statistic RA Percent Paced: 45 %
Date Time Interrogation Session: 20260127041928
Implantable Lead Connection Status: 753985
Implantable Lead Connection Status: 753985
Implantable Lead Connection Status: 753985
Implantable Lead Implant Date: 20010807
Implantable Lead Implant Date: 20010807
Implantable Lead Implant Date: 20140307
Implantable Lead Location: 753858
Implantable Lead Location: 753859
Implantable Lead Location: 753860
Implantable Pulse Generator Implant Date: 20220221
Lead Channel Impedance Value: 280 Ohm
Lead Channel Impedance Value: 310 Ohm
Lead Channel Impedance Value: 600 Ohm
Lead Channel Pacing Threshold Amplitude: 0.5 V
Lead Channel Pacing Threshold Amplitude: 0.625 V
Lead Channel Pacing Threshold Amplitude: 0.875 V
Lead Channel Pacing Threshold Pulse Width: 0.4 ms
Lead Channel Pacing Threshold Pulse Width: 0.4 ms
Lead Channel Pacing Threshold Pulse Width: 0.4 ms
Lead Channel Sensing Intrinsic Amplitude: 12 mV
Lead Channel Sensing Intrinsic Amplitude: 2.2 mV
Lead Channel Setting Pacing Amplitude: 2 V
Lead Channel Setting Pacing Amplitude: 2 V
Lead Channel Setting Pacing Amplitude: 2 V
Lead Channel Setting Pacing Pulse Width: 0.4 ms
Lead Channel Setting Pacing Pulse Width: 0.4 ms
Lead Channel Setting Sensing Sensitivity: 5 mV
Pulse Gen Model: 3222
Pulse Gen Serial Number: 9194565

## 2024-10-11 ENCOUNTER — Ambulatory Visit: Payer: Self-pay | Admitting: Student in an Organized Health Care Education/Training Program

## 2024-10-12 MED ORDER — NIACIN ER (ANTIHYPERLIPIDEMIC) 500 MG PO TBCR: 500.0000 mg | EXTENDED_RELEASE_TABLET | Freq: Every day | ORAL | 2 refills | Status: AC

## 2024-10-12 NOTE — Telephone Encounter (Signed)
 Refil sent.

## 2024-10-12 NOTE — Telephone Encounter (Signed)
Pt states he is out.

## 2024-10-15 NOTE — Progress Notes (Signed)
 Remote PPM Transmission

## 2024-12-14 ENCOUNTER — Encounter

## 2024-12-25 ENCOUNTER — Encounter

## 2025-01-05 ENCOUNTER — Ambulatory Visit

## 2025-03-15 ENCOUNTER — Encounter

## 2025-03-26 ENCOUNTER — Encounter
